# Patient Record
Sex: Female | Born: 1975 | Race: White | Hispanic: No | Marital: Married | Smoking: Former smoker
Health system: Southern US, Community
[De-identification: ages and names within clinical notes are randomized; demographics above are authoritative.]

## PROBLEM LIST (undated history)

## (undated) DIAGNOSIS — K37 Unspecified appendicitis: Secondary | ICD-10-CM

## (undated) DIAGNOSIS — F32A Depression, unspecified: Secondary | ICD-10-CM

## (undated) DIAGNOSIS — R011 Cardiac murmur, unspecified: Secondary | ICD-10-CM

## (undated) DIAGNOSIS — F329 Major depressive disorder, single episode, unspecified: Secondary | ICD-10-CM

## (undated) DIAGNOSIS — E7800 Pure hypercholesterolemia, unspecified: Secondary | ICD-10-CM

## (undated) DIAGNOSIS — E119 Type 2 diabetes mellitus without complications: Secondary | ICD-10-CM

## (undated) DIAGNOSIS — F419 Anxiety disorder, unspecified: Secondary | ICD-10-CM

## (undated) DIAGNOSIS — I10 Essential (primary) hypertension: Secondary | ICD-10-CM

## (undated) HISTORY — PX: APPENDECTOMY: SHX54

## (undated) HISTORY — PX: TONSILLECTOMY: SUR1361

## (undated) HISTORY — DX: Unspecified appendicitis: K37

---

## 2003-12-25 ENCOUNTER — Emergency Department: Payer: Self-pay | Admitting: Emergency Medicine

## 2004-03-26 ENCOUNTER — Emergency Department: Payer: Self-pay | Admitting: Unknown Physician Specialty

## 2004-03-29 ENCOUNTER — Emergency Department: Payer: Self-pay | Admitting: Emergency Medicine

## 2004-06-07 ENCOUNTER — Ambulatory Visit: Payer: Self-pay

## 2004-09-06 ENCOUNTER — Ambulatory Visit: Payer: Self-pay | Admitting: Unknown Physician Specialty

## 2004-09-12 ENCOUNTER — Emergency Department: Payer: Self-pay | Admitting: Emergency Medicine

## 2005-01-27 ENCOUNTER — Emergency Department: Payer: Self-pay | Admitting: Emergency Medicine

## 2005-04-03 ENCOUNTER — Emergency Department: Payer: Self-pay | Admitting: Emergency Medicine

## 2005-06-02 ENCOUNTER — Ambulatory Visit: Payer: Self-pay | Admitting: Family Medicine

## 2005-12-27 ENCOUNTER — Ambulatory Visit: Payer: Self-pay | Admitting: Gastroenterology

## 2006-06-11 ENCOUNTER — Emergency Department: Payer: Self-pay | Admitting: Emergency Medicine

## 2006-09-04 DIAGNOSIS — D509 Iron deficiency anemia, unspecified: Secondary | ICD-10-CM | POA: Insufficient documentation

## 2006-09-04 DIAGNOSIS — E1169 Type 2 diabetes mellitus with other specified complication: Secondary | ICD-10-CM | POA: Insufficient documentation

## 2006-09-04 DIAGNOSIS — E1159 Type 2 diabetes mellitus with other circulatory complications: Secondary | ICD-10-CM | POA: Insufficient documentation

## 2006-09-04 DIAGNOSIS — I152 Hypertension secondary to endocrine disorders: Secondary | ICD-10-CM | POA: Insufficient documentation

## 2006-10-23 DIAGNOSIS — J309 Allergic rhinitis, unspecified: Secondary | ICD-10-CM | POA: Insufficient documentation

## 2007-02-06 ENCOUNTER — Emergency Department: Payer: Self-pay | Admitting: Emergency Medicine

## 2007-05-01 DIAGNOSIS — R011 Cardiac murmur, unspecified: Secondary | ICD-10-CM | POA: Insufficient documentation

## 2007-07-21 ENCOUNTER — Emergency Department: Payer: Self-pay | Admitting: Emergency Medicine

## 2007-08-16 DIAGNOSIS — Z0000 Encounter for general adult medical examination without abnormal findings: Secondary | ICD-10-CM | POA: Insufficient documentation

## 2007-08-16 DIAGNOSIS — R319 Hematuria, unspecified: Secondary | ICD-10-CM | POA: Insufficient documentation

## 2007-09-04 DIAGNOSIS — J302 Other seasonal allergic rhinitis: Secondary | ICD-10-CM

## 2007-11-29 ENCOUNTER — Other Ambulatory Visit: Payer: Self-pay

## 2007-11-29 ENCOUNTER — Emergency Department: Payer: Self-pay | Admitting: Unknown Physician Specialty

## 2008-05-16 ENCOUNTER — Emergency Department: Payer: Self-pay | Admitting: Emergency Medicine

## 2009-09-25 ENCOUNTER — Emergency Department: Payer: Self-pay | Admitting: Emergency Medicine

## 2010-02-25 ENCOUNTER — Emergency Department: Payer: Self-pay | Admitting: Emergency Medicine

## 2010-11-06 ENCOUNTER — Emergency Department: Payer: Self-pay | Admitting: Emergency Medicine

## 2011-07-30 ENCOUNTER — Emergency Department: Payer: Self-pay | Admitting: Emergency Medicine

## 2011-08-23 ENCOUNTER — Emergency Department: Payer: Self-pay | Admitting: *Deleted

## 2011-08-23 LAB — URINALYSIS, COMPLETE
Leukocyte Esterase: NEGATIVE
Nitrite: NEGATIVE
Ph: 5 (ref 4.5–8.0)
Protein: 30
Specific Gravity: 1.029 (ref 1.003–1.030)
Squamous Epithelial: 1
WBC UR: 5 /HPF (ref 0–5)

## 2011-08-23 LAB — PREGNANCY, URINE: Pregnancy Test, Urine: NEGATIVE m[IU]/mL

## 2011-08-24 LAB — CBC WITH DIFFERENTIAL/PLATELET
HCT: 38.3 % (ref 35.0–47.0)
Lymphocyte #: 3.1 10*3/uL (ref 1.0–3.6)
Lymphocyte %: 29.2 %
MCH: 25.6 pg — ABNORMAL LOW (ref 26.0–34.0)
MCHC: 31.4 g/dL — ABNORMAL LOW (ref 32.0–36.0)
MCV: 82 fL (ref 80–100)
Monocyte #: 0.5 x10 3/mm (ref 0.2–0.9)
Monocyte %: 4.8 %
Neutrophil #: 6.9 10*3/uL — ABNORMAL HIGH (ref 1.4–6.5)
Neutrophil %: 64.9 %
Platelet: 341 10*3/uL (ref 150–440)
RDW: 14.6 % — ABNORMAL HIGH (ref 11.5–14.5)

## 2011-08-24 LAB — BASIC METABOLIC PANEL
Anion Gap: 10 (ref 7–16)
BUN: 7 mg/dL (ref 7–18)
Calcium, Total: 8.9 mg/dL (ref 8.5–10.1)
Co2: 25 mmol/L (ref 21–32)
Glucose: 254 mg/dL — ABNORMAL HIGH (ref 65–99)
Osmolality: 286 (ref 275–301)
Potassium: 4 mmol/L (ref 3.5–5.1)
Sodium: 140 mmol/L (ref 136–145)

## 2011-10-01 ENCOUNTER — Emergency Department: Payer: Self-pay | Admitting: Emergency Medicine

## 2011-11-24 ENCOUNTER — Ambulatory Visit: Payer: Self-pay | Admitting: Internal Medicine

## 2013-07-30 ENCOUNTER — Emergency Department: Payer: Self-pay | Admitting: Emergency Medicine

## 2014-05-11 ENCOUNTER — Emergency Department: Payer: Self-pay | Admitting: Emergency Medicine

## 2014-08-19 ENCOUNTER — Telehealth: Payer: Self-pay | Admitting: Family Medicine

## 2014-08-19 DIAGNOSIS — E1165 Type 2 diabetes mellitus with hyperglycemia: Secondary | ICD-10-CM

## 2014-08-19 DIAGNOSIS — E785 Hyperlipidemia, unspecified: Secondary | ICD-10-CM

## 2014-08-19 DIAGNOSIS — IMO0002 Reserved for concepts with insufficient information to code with codable children: Secondary | ICD-10-CM

## 2014-08-19 DIAGNOSIS — J301 Allergic rhinitis due to pollen: Secondary | ICD-10-CM

## 2014-08-19 DIAGNOSIS — I10 Essential (primary) hypertension: Secondary | ICD-10-CM

## 2014-08-19 DIAGNOSIS — K219 Gastro-esophageal reflux disease without esophagitis: Secondary | ICD-10-CM

## 2014-08-19 DIAGNOSIS — N920 Excessive and frequent menstruation with regular cycle: Secondary | ICD-10-CM

## 2014-08-19 DIAGNOSIS — D509 Iron deficiency anemia, unspecified: Secondary | ICD-10-CM

## 2014-08-19 DIAGNOSIS — G4700 Insomnia, unspecified: Secondary | ICD-10-CM

## 2014-08-19 NOTE — Telephone Encounter (Signed)
I have asked Michelle Warner to work on transferring patient chart from [employer] to [employer] so I can refill her several medications. This may take some time.

## 2014-08-19 NOTE — Telephone Encounter (Signed)
Patient's chart needs to be extracted from [employer] before I can send refills.

## 2014-08-19 NOTE — Telephone Encounter (Signed)
Patient requesting refills on all medications expecially Benzonatate. Please send to [facility]

## 2014-08-19 NOTE — Telephone Encounter (Signed)
Patient's chart has been abstracted, but is not visible as of 08/19/14 @ 5:00pm

## 2014-08-19 NOTE — Telephone Encounter (Signed)
Patient is requesting a refill on all medications. She is completely out of Benzonatate. Please send to [facility]

## 2014-08-20 DIAGNOSIS — E8881 Metabolic syndrome: Secondary | ICD-10-CM | POA: Insufficient documentation

## 2014-08-20 DIAGNOSIS — N920 Excessive and frequent menstruation with regular cycle: Secondary | ICD-10-CM | POA: Insufficient documentation

## 2014-08-20 DIAGNOSIS — G4733 Obstructive sleep apnea (adult) (pediatric): Secondary | ICD-10-CM | POA: Insufficient documentation

## 2014-08-20 DIAGNOSIS — E1165 Type 2 diabetes mellitus with hyperglycemia: Secondary | ICD-10-CM | POA: Insufficient documentation

## 2014-08-20 DIAGNOSIS — IMO0002 Reserved for concepts with insufficient information to code with codable children: Secondary | ICD-10-CM | POA: Insufficient documentation

## 2014-08-20 DIAGNOSIS — G4700 Insomnia, unspecified: Secondary | ICD-10-CM | POA: Insufficient documentation

## 2014-08-20 MED ORDER — OMEPRAZOLE 40 MG PO CPDR: 40.0000 mg | DELAYED_RELEASE_CAPSULE | Freq: Every day | ORAL | Status: DC

## 2014-08-20 MED ORDER — METFORMIN HCL 500 MG PO TABS: 500.0000 mg | ORAL_TABLET | Freq: Two times a day (BID) | ORAL | Status: DC

## 2014-08-20 MED ORDER — FLUTICASONE PROPIONATE 50 MCG/ACT NA SUSP: 2.0000 | Freq: Every day | NASAL | Status: DC

## 2014-08-20 MED ORDER — LOVASTATIN 20 MG PO TABS: 20.0000 mg | ORAL_TABLET | Freq: Every day | ORAL | Status: DC

## 2014-08-20 MED ORDER — CANAGLIFLOZIN 100 MG PO TABS: 100.0000 mg | ORAL_TABLET | Freq: Every day | ORAL | Status: DC

## 2014-08-20 MED ORDER — GLIPIZIDE 10 MG PO TABS: 10.0000 mg | ORAL_TABLET | Freq: Two times a day (BID) | ORAL | Status: DC

## 2014-08-20 MED ORDER — TRAZODONE HCL 50 MG PO TABS: 50.0000 mg | ORAL_TABLET | Freq: Every evening | ORAL | Status: DC | PRN

## 2014-08-20 MED ORDER — LISINOPRIL-HYDROCHLOROTHIAZIDE 20-12.5 MG PO TABS: 1.0000 | ORAL_TABLET | Freq: Every day | ORAL | Status: DC

## 2014-08-20 MED ORDER — BENZONATATE 100 MG PO CAPS: 100.0000 mg | ORAL_CAPSULE | Freq: Three times a day (TID) | ORAL | Status: DC | PRN

## 2014-08-20 MED ORDER — FERROUS SULFATE 325 (65 FE) MG PO TABS: 325.0000 mg | ORAL_TABLET | Freq: Two times a day (BID) | ORAL | Status: DC

## 2014-08-20 MED ORDER — NORGESTIMATE-ETH ESTRADIOL 0.25-35 MG-MCG PO TABS: 1.0000 | ORAL_TABLET | Freq: Every day | ORAL | Status: DC

## 2014-08-20 NOTE — Telephone Encounter (Signed)
All medications abstracted, assigned to diagnosis and refilled to preferred pharmacy.

## 2014-09-10 ENCOUNTER — Other Ambulatory Visit: Payer: Self-pay | Admitting: Family Medicine

## 2014-09-10 NOTE — Telephone Encounter (Signed)
PT NEEDS REFILL ON BIRTH CONTROL PILLS. PHARM IS [place].

## 2014-09-11 ENCOUNTER — Other Ambulatory Visit: Payer: Self-pay

## 2014-09-11 DIAGNOSIS — N920 Excessive and frequent menstruation with regular cycle: Secondary | ICD-10-CM

## 2014-09-11 MED ORDER — NORGESTIMATE-ETH ESTRADIOL 0.25-35 MG-MCG PO TABS: 1.0000 | ORAL_TABLET | Freq: Every day | ORAL | Status: DC

## 2014-09-11 NOTE — Telephone Encounter (Signed)
Refill request was placed and sent to Dr. Nadine Counts for her approval.

## 2014-09-11 NOTE — Telephone Encounter (Signed)
Patient called requesting a refill of her birth control pills. Patient uses [facility] on [address]

## 2014-09-16 ENCOUNTER — Telehealth: Payer: Self-pay

## 2014-09-16 NOTE — Telephone Encounter (Signed)
Pt called would like refill on tesslon for her cough if possible.[phone]

## 2014-09-18 ENCOUNTER — Other Ambulatory Visit: Payer: Self-pay | Admitting: Family Medicine

## 2014-09-18 DIAGNOSIS — R0982 Postnasal drip: Principal | ICD-10-CM

## 2014-09-18 DIAGNOSIS — J309 Allergic rhinitis, unspecified: Secondary | ICD-10-CM

## 2014-09-19 ENCOUNTER — Emergency Department
Admission: EM | Admit: 2014-09-19 | Discharge: 2014-09-19 | Disposition: A | Discharge status: Home / Self Care | Payer: Medicaid Other | Attending: Emergency Medicine | Admitting: Emergency Medicine

## 2014-09-19 ENCOUNTER — Encounter: Payer: Self-pay | Admitting: Emergency Medicine

## 2014-09-19 ENCOUNTER — Emergency Department: Payer: Medicaid Other

## 2014-09-19 DIAGNOSIS — J310 Chronic rhinitis: Secondary | ICD-10-CM | POA: Insufficient documentation

## 2014-09-19 DIAGNOSIS — I10 Essential (primary) hypertension: Secondary | ICD-10-CM | POA: Insufficient documentation

## 2014-09-19 DIAGNOSIS — E119 Type 2 diabetes mellitus without complications: Secondary | ICD-10-CM | POA: Insufficient documentation

## 2014-09-19 DIAGNOSIS — Z79899 Other long term (current) drug therapy: Secondary | ICD-10-CM | POA: Insufficient documentation

## 2014-09-19 DIAGNOSIS — Z87891 Personal history of nicotine dependence: Secondary | ICD-10-CM | POA: Insufficient documentation

## 2014-09-19 DIAGNOSIS — G4733 Obstructive sleep apnea (adult) (pediatric): Secondary | ICD-10-CM | POA: Insufficient documentation

## 2014-09-19 HISTORY — DX: Essential (primary) hypertension: I10

## 2014-09-19 HISTORY — DX: Pure hypercholesterolemia, unspecified: E78.00

## 2014-09-19 HISTORY — DX: Cardiac murmur, unspecified: R01.1

## 2014-09-19 HISTORY — DX: Type 2 diabetes mellitus without complications: E11.9

## 2014-09-19 MED ORDER — ALBUTEROL SULFATE HFA 108 (90 BASE) MCG/ACT IN AERS: 2.0000 | INHALATION_SPRAY | Freq: Four times a day (QID) | RESPIRATORY_TRACT | Status: DC | PRN

## 2014-09-19 MED ORDER — IPRATROPIUM-ALBUTEROL 0.5-2.5 (3) MG/3ML IN SOLN
3.0000 mL | Freq: Once | RESPIRATORY_TRACT | Status: AC
  Administered 2014-09-19: 3 mL via RESPIRATORY_TRACT

## 2014-09-19 MED ORDER — IPRATROPIUM-ALBUTEROL 0.5-2.5 (3) MG/3ML IN SOLN
RESPIRATORY_TRACT | Status: AC
  Administered 2014-09-19: 3 mL via RESPIRATORY_TRACT
  Filled 2014-09-19: qty 3

## 2014-09-19 NOTE — Discharge Instructions (Signed)
Sleep Apnea Sleep apnea is disorder that affects a person's sleep. A person with sleep apnea has abnormal pauses in their breathing when they sleep. It is hard for them to get a good sleep. This makes a person tired during the day. It also can lead to other physical problems. There are three types of sleep apnea. One type is when breathing stops for a short time because your airway is blocked (obstructive sleep apnea). Another type is when the brain sometimes fails to give the normal signal to breathe to the muscles that control your breathing (central sleep apnea). The third type is a combination of the other two types. HOME CARE  Do not sleep on your back. Try to sleep on your side.  Take all medicine as told by your doctor.  Avoid alcohol, calming medicines (sedatives), and depressant drugs.  Try to lose weight if you are overweight. Talk to your doctor about a healthy weight goal. Your doctor may have you use a device that helps to open your airway. It can help you get the air that you need. It is called a positive airway pressure (PAP) device. There are three types of PAP devices:  Continuous positive airway pressure (CPAP) device.  Nasal expiratory positive airway pressure (EPAP) device.  Bilevel positive airway pressure (BPAP) device. MAKE SURE YOU:  Understand these instructions.  Will watch your condition.  Will get help right away if you are not doing well or get worse. Document Released: 12/14/2007 Document Revised: 02/21/2012 Document Reviewed: 07/08/2011 [facility] Patient Information 2015 [place], [place]. This information is not intended to replace advice given to you by your health care provider. Make sure you discuss any questions you have with your health care provider.  Your exam and chest x-ray are normal.  Use the albuterol inhaler as needed.  Follow-up with [facility] for continued symptoms.

## 2014-09-19 NOTE — ED Notes (Signed)
Clear drainage

## 2014-09-19 NOTE — ED Provider Notes (Signed)
[facility] Emergency Department Provider Note ____________________________________________  Time seen: 2040  I have reviewed the triage vital signs and the nursing notes.  HISTORY  Chief Complaint  Nasal Congestion  HPI Michelle Warner is a 39 y.o. female reports to the ED with complaints of sinus congestion as well as some increased shortness of breath. She reports 2 separate episodes of the last couple days, when she is awoken at 5:30 in the morning, the sensation of severe shortness of breath. She denies fever, chills, sweats, or cough. She notes clear nasal drainage, and sensation of fullness in the chest. She has been taking her previously prescribed allergy medicine and her nasal spray as well.  Past Medical History  Diagnosis Date  . Diabetes mellitus without complication   . Hypertension   . Heart murmur   . Hypercholesterolemia     Patient Active Problem List   Diagnosis Date Noted  . Body mass index of 60 or higher 08/20/2014  . Cannot sleep 08/20/2014  . Excessive and frequent menstruation 08/20/2014  . Dysmetabolic syndrome 02/77/4128  . Obstructive sleep apnea of adult 08/20/2014  . Type II diabetes mellitus, uncontrolled 08/20/2014  . Migraine without aura and responsive to treatment 05/01/2007  . Allergic rhinitis 10/23/2006  . Gastro-esophageal reflux disease without esophagitis 09/04/2006  . Hypertension goal BP (blood pressure) < 140/90 09/04/2006  . HLD (hyperlipidemia) 09/04/2006  . Anemia, iron deficiency 09/04/2006    Past Surgical History  Procedure Laterality Date  . Tonsillectomy      Current Outpatient Rx  Name  Route  Sig  Dispense  Refill  . loratadine (CLARITIN) 10 MG tablet   Oral   Take 10 mg by mouth daily.         Marland Kitchen albuterol (PROVENTIL HFA;VENTOLIN HFA) 108 (90 BASE) MCG/ACT inhaler   Inhalation   Inhale 2 puffs into the lungs every 6 (six) hours as needed for wheezing or shortness of breath.   1 Inhaler    0   . benzonatate (TESSALON) 100 MG capsule   Oral   Take 1-2 capsules (100-200 mg total) by mouth every 8 (eight) hours as needed for cough.   20 capsule   0   . canagliflozin (INVOKANA) 100 MG TABS tablet   Oral   Take 1 tablet (100 mg total) by mouth daily.   30 tablet   5   . EQ ALLERGY RELIEF 10 MG tablet      TAKE ONE TABLET BY MOUTH ONCE DAILY   30 tablet   5   . ferrous sulfate 325 (65 FE) MG tablet   Oral   Take 1 tablet (325 mg total) by mouth 2 (two) times daily.   60 tablet   5   . fluticasone (FLONASE) 50 MCG/ACT nasal spray   Each Nare   Place 2 sprays into both nostrils daily.   16 g   2   . glipiZIDE (GLUCOTROL) 10 MG tablet   Oral   Take 1 tablet (10 mg total) by mouth 2 (two) times daily.   60 tablet   5   . lisinopril-hydrochlorothiazide (PRINZIDE,ZESTORETIC) 20-12.5 MG per tablet   Oral   Take 1 tablet by mouth daily.   30 tablet   5   . lovastatin (MEVACOR) 20 MG tablet   Oral   Take 1 tablet (20 mg total) by mouth at bedtime.   30 tablet   5   . metFORMIN (GLUCOPHAGE) 500 MG tablet  Oral   Take 1 tablet (500 mg total) by mouth 2 (two) times daily.   60 tablet   5   . norgestimate-ethinyl estradiol (ORTHO-CYCLEN,SPRINTEC,PREVIFEM) 0.25-35 MG-MCG tablet   Oral   Take 1 tablet by mouth daily.   1 Package   5   . omeprazole (PRILOSEC) 40 MG capsule   Oral   Take 1 capsule (40 mg total) by mouth daily.   30 capsule   5   . traZODone (DESYREL) 50 MG tablet   Oral   Take 1 tablet (50 mg total) by mouth at bedtime as needed.   30 tablet   5     Allergies Review of patient's allergies indicates not on file.  No family history on file.  Social History History  Substance Use Topics  . Smoking status: Former Research scientist (life sciences)  . Smokeless tobacco: Not on file  . Alcohol Use: No   Review of Systems  Constitutional: Negative for fever. Eyes: Negative for visual changes. ENT: Negative for sore throat. Cardiovascular: Negative  for chest pain. Respiratory: Positive for shortness of breath. Gastrointestinal: Negative for abdominal pain, vomiting and diarrhea. Genitourinary: Negative for dysuria. Musculoskeletal: Negative for back pain. Skin: Negative for rash. Neurological: Negative for headaches, focal weakness or numbness. ____________________________________________  PHYSICAL EXAM:  VITAL SIGNS: ED Triage Vitals  Enc Vitals Group     BP 09/19/14 1905 131/82 mmHg     Pulse Rate 09/19/14 1905 100     Resp 09/19/14 1905 18     Temp 09/19/14 1905 98.2 F (36.8 C)     Temp Source 09/19/14 1905 Oral     SpO2 09/19/14 1905 97 %     Weight 09/19/14 1905 223 lb (101.152 kg)     Height 09/19/14 1905 5\' 2"  (1.575 m)     Head Cir --      Peak Flow --      Pain Score 09/19/14 1906 9     Pain Loc --      Pain Edu? --      Excl. in [place]? --    Constitutional: Alert and oriented. Well appearing and in no distress. Eyes: Conjunctivae are normal. PERRL. Normal extraocular movements. ENT   Head: Normocephalic and atraumatic.   Nose: No congestion/rhinnorhea.   Mouth/Throat: Mucous membranes are moist.   Neck: Supple. No thyromegaly. Hematological/Lymphatic/Immunilogical: No cervical lymphadenopathy. Cardiovascular: Normal rate, regular rhythm.  Respiratory: Normal respiratory effort. No wheezes/rales/rhonchi. Gastrointestinal: Soft and nontender. No distention. Musculoskeletal: Nontender with normal range of motion in all extremities.  Neurologic:  Normal gait without ataxia. Normal speech and language. No gross focal neurologic deficits are appreciated. Skin:  Skin is warm, dry and intact. No rash noted. Psychiatric: Mood and affect are normal. Patient exhibits appropriate insight and judgment. ____________________________________________   RADIOLOGY CXR - viewed by me; interpreted by radiology IMPRESSION: No active cardiopulmonary  disease ____________________________________________  PROCEDURES  DuoNeb x 1 ____________________________________________  INITIAL IMPRESSION / ASSESSMENT AND PLAN / ED COURSE  Allergic rhinitis and chronic OSA. Radiology results to patient, reassurance about dyspnea. Patient reports improvement in SOB following breathing treatment. Symptoms may be related to panic attacks.  Suggest follow-up with primary provider for ongoing symptoms. Continue home meds as directed. ____________________________________________  FINAL CLINICAL IMPRESSION(S) / ED DIAGNOSES  Final diagnoses:  Rhinitis, chronic  OSA (obstructive sleep apnea)     Melvenia Needles, PA-C 09/20/14 0050  Ahmed Prima, MD 09/20/14 2332

## 2014-10-14 ENCOUNTER — Ambulatory Visit: Payer: Self-pay | Admitting: Family Medicine

## 2014-12-08 ENCOUNTER — Emergency Department: Payer: Self-pay

## 2014-12-08 ENCOUNTER — Observation Stay: Payer: Self-pay | Admitting: Anesthesiology

## 2014-12-08 ENCOUNTER — Encounter: Payer: Self-pay | Admitting: Emergency Medicine

## 2014-12-08 ENCOUNTER — Observation Stay
Admission: EM | Admit: 2014-12-08 | Discharge: 2014-12-09 | Disposition: A | Discharge status: Home / Self Care | Payer: Self-pay | Attending: Surgery | Admitting: Surgery

## 2014-12-08 ENCOUNTER — Encounter
Admission: EM | Disposition: A | Discharge status: Home / Self Care | Payer: Self-pay | Source: Home / Self Care | Attending: Emergency Medicine

## 2014-12-08 ENCOUNTER — Observation Stay: Payer: Medicaid Other | Admitting: Anesthesiology

## 2014-12-08 DIAGNOSIS — Z87891 Personal history of nicotine dependence: Secondary | ICD-10-CM | POA: Insufficient documentation

## 2014-12-08 DIAGNOSIS — R6883 Chills (without fever): Secondary | ICD-10-CM | POA: Insufficient documentation

## 2014-12-08 DIAGNOSIS — E780 Pure hypercholesterolemia: Secondary | ICD-10-CM | POA: Insufficient documentation

## 2014-12-08 DIAGNOSIS — E119 Type 2 diabetes mellitus without complications: Secondary | ICD-10-CM | POA: Insufficient documentation

## 2014-12-08 DIAGNOSIS — E669 Obesity, unspecified: Secondary | ICD-10-CM | POA: Insufficient documentation

## 2014-12-08 DIAGNOSIS — K37 Unspecified appendicitis: Principal | ICD-10-CM | POA: Insufficient documentation

## 2014-12-08 DIAGNOSIS — E1165 Type 2 diabetes mellitus with hyperglycemia: Secondary | ICD-10-CM

## 2014-12-08 DIAGNOSIS — K353 Acute appendicitis with localized peritonitis, without perforation or gangrene: Secondary | ICD-10-CM

## 2014-12-08 DIAGNOSIS — Z6841 Body Mass Index (BMI) 40.0 and over, adult: Secondary | ICD-10-CM | POA: Insufficient documentation

## 2014-12-08 DIAGNOSIS — N852 Hypertrophy of uterus: Secondary | ICD-10-CM | POA: Insufficient documentation

## 2014-12-08 DIAGNOSIS — R079 Chest pain, unspecified: Secondary | ICD-10-CM | POA: Insufficient documentation

## 2014-12-08 DIAGNOSIS — I10 Essential (primary) hypertension: Secondary | ICD-10-CM | POA: Insufficient documentation

## 2014-12-08 DIAGNOSIS — Z794 Long term (current) use of insulin: Secondary | ICD-10-CM | POA: Insufficient documentation

## 2014-12-08 DIAGNOSIS — D509 Iron deficiency anemia, unspecified: Secondary | ICD-10-CM | POA: Insufficient documentation

## 2014-12-08 DIAGNOSIS — R319 Hematuria, unspecified: Secondary | ICD-10-CM | POA: Insufficient documentation

## 2014-12-08 DIAGNOSIS — R1031 Right lower quadrant pain: Secondary | ICD-10-CM | POA: Insufficient documentation

## 2014-12-08 DIAGNOSIS — IMO0002 Reserved for concepts with insufficient information to code with codable children: Secondary | ICD-10-CM

## 2014-12-08 HISTORY — PX: LAPAROSCOPIC APPENDECTOMY: SHX408

## 2014-12-08 LAB — URINALYSIS COMPLETE WITH MICROSCOPIC (ARMC ONLY)
Bilirubin Urine: NEGATIVE
GLUCOSE, UA: 50 mg/dL — AB
Nitrite: NEGATIVE
Protein, ur: 30 mg/dL — AB
Specific Gravity, Urine: 1.024 (ref 1.005–1.030)
pH: 5 (ref 5.0–8.0)

## 2014-12-08 LAB — GLUCOSE, CAPILLARY: Glucose-Capillary: 227 mg/dL — ABNORMAL HIGH (ref 65–99)

## 2014-12-08 LAB — CBC
HCT: 38.4 % (ref 35.0–47.0)
Hemoglobin: 12.7 g/dL (ref 12.0–16.0)
MCH: 28.4 pg (ref 26.0–34.0)
MCHC: 33.2 g/dL (ref 32.0–36.0)
MCV: 85.6 fL (ref 80.0–100.0)
Platelets: 365 10*3/uL (ref 150–440)
RBC: 4.48 MIL/uL (ref 3.80–5.20)
RDW: 12.9 % (ref 11.5–14.5)
WBC: 18.2 10*3/uL — ABNORMAL HIGH (ref 3.6–11.0)

## 2014-12-08 LAB — BASIC METABOLIC PANEL
Anion gap: 11 (ref 5–15)
BUN: 10 mg/dL (ref 6–20)
CALCIUM: 8.9 mg/dL (ref 8.9–10.3)
CHLORIDE: 101 mmol/L (ref 101–111)
CO2: 23 mmol/L (ref 22–32)
CREATININE: 0.65 mg/dL (ref 0.44–1.00)
GFR calc Af Amer: >60 mL/min (ref >60)
GFR calc non Af Amer: >60 mL/min (ref >60)
Glucose, Bld: 247 mg/dL — ABNORMAL HIGH (ref 65–99)
Potassium: 3.8 mmol/L (ref 3.5–5.1)
SODIUM: 135 mmol/L (ref 135–145)

## 2014-12-08 LAB — TROPONIN I: Troponin I: 0.03 ng/mL (ref <0.031)

## 2014-12-08 SURGERY — APPENDECTOMY, LAPAROSCOPIC
Anesthesia: General | Wound class: Clean Contaminated

## 2014-12-08 MED ORDER — FENTANYL CITRATE (PF) 100 MCG/2ML IJ SOLN: 25.0000 ug | INTRAMUSCULAR | Status: DC | PRN

## 2014-12-08 MED ORDER — HYDROCODONE-ACETAMINOPHEN 5-325 MG PO TABS: 1.0000 | ORAL_TABLET | Freq: Four times a day (QID) | ORAL | Status: DC | PRN

## 2014-12-08 MED ORDER — HYDRALAZINE HCL 20 MG/ML IJ SOLN: 10.0000 mg | INTRAMUSCULAR | Status: DC | PRN

## 2014-12-08 MED ORDER — ONDANSETRON HCL 4 MG/2ML IJ SOLN: 4.0000 mg | Freq: Four times a day (QID) | INTRAMUSCULAR | Status: DC | PRN

## 2014-12-08 MED ORDER — ROCURONIUM BROMIDE 100 MG/10ML IV SOLN
INTRAVENOUS | Status: DC | PRN
  Administered 2014-12-08: 15 mg via INTRAVENOUS

## 2014-12-08 MED ORDER — NEOSTIGMINE METHYLSULFATE 10 MG/10ML IV SOLN
INTRAVENOUS | Status: DC | PRN
  Administered 2014-12-08: 3 mg via INTRAVENOUS

## 2014-12-08 MED ORDER — DIPHENHYDRAMINE HCL 50 MG/ML IJ SOLN: 25.0000 mg | Freq: Four times a day (QID) | INTRAMUSCULAR | Status: DC | PRN

## 2014-12-08 MED ORDER — BUPIVACAINE HCL 0.25 % IJ SOLN
INTRAMUSCULAR | Status: DC | PRN
  Administered 2014-12-08: 30 mL

## 2014-12-08 MED ORDER — ONDANSETRON HCL 4 MG PO TABS: 4.0000 mg | ORAL_TABLET | Freq: Four times a day (QID) | ORAL | Status: DC | PRN

## 2014-12-08 MED ORDER — FAMOTIDINE 20 MG PO TABS
ORAL_TABLET | ORAL | Status: AC
  Administered 2014-12-08: 40 mg via ORAL
  Filled 2014-12-08: qty 2

## 2014-12-08 MED ORDER — ACETAMINOPHEN 650 MG RE SUPP: 650.0000 mg | Freq: Four times a day (QID) | RECTAL | Status: DC | PRN

## 2014-12-08 MED ORDER — MORPHINE SULFATE (PF) 2 MG/ML IV SOLN
2.0000 mg | INTRAVENOUS | Status: DC | PRN
  Administered 2014-12-08: 2 mg via INTRAVENOUS
  Filled 2014-12-08: qty 1

## 2014-12-08 MED ORDER — ERTAPENEM SODIUM 1 G IJ SOLR: 1.0000 g | INTRAMUSCULAR | Status: DC

## 2014-12-08 MED ORDER — ONDANSETRON 4 MG PO TBDP: 4.0000 mg | ORAL_TABLET | Freq: Four times a day (QID) | ORAL | Status: DC | PRN

## 2014-12-08 MED ORDER — SODIUM CHLORIDE 0.9 % IV BOLUS (SEPSIS)
1000.0000 mL | Freq: Once | INTRAVENOUS | Status: AC
  Administered 2014-12-08: 1000 mL via INTRAVENOUS

## 2014-12-08 MED ORDER — MIDAZOLAM HCL 2 MG/2ML IJ SOLN
INTRAMUSCULAR | Status: DC | PRN
  Administered 2014-12-08 (×2): 1 mg via INTRAVENOUS

## 2014-12-08 MED ORDER — GI COCKTAIL ~~LOC~~
30.0000 mL | ORAL | Status: AC
  Administered 2014-12-08: 30 mL via ORAL
  Filled 2014-12-08: qty 30

## 2014-12-08 MED ORDER — ONDANSETRON HCL 4 MG/2ML IJ SOLN
INTRAMUSCULAR | Status: DC | PRN
  Administered 2014-12-08: 4 mg via INTRAVENOUS

## 2014-12-08 MED ORDER — LIDOCAINE HCL (CARDIAC) 20 MG/ML IV SOLN
INTRAVENOUS | Status: DC | PRN
Start: 2014-12-08 — End: 2014-12-08
  Administered 2014-12-08: 50 mg via INTRAVENOUS

## 2014-12-08 MED ORDER — HYDROCODONE-ACETAMINOPHEN 5-325 MG PO TABS: 1.0000 | ORAL_TABLET | ORAL | Status: DC | PRN

## 2014-12-08 MED ORDER — DEXAMETHASONE SODIUM PHOSPHATE 4 MG/ML IJ SOLN
INTRAMUSCULAR | Status: DC | PRN
  Administered 2014-12-08: 4 mg via INTRAVENOUS

## 2014-12-08 MED ORDER — SODIUM CHLORIDE 0.9 % IV SOLN
1.0000 g | INTRAVENOUS | Status: DC
  Administered 2014-12-08: 1 g via INTRAVENOUS
  Filled 2014-12-08 (×2): qty 1

## 2014-12-08 MED ORDER — FAMOTIDINE 40 MG PO TABS
40.0000 mg | ORAL_TABLET | Freq: Once | ORAL | Status: AC
  Administered 2014-12-08: 40 mg via ORAL
  Filled 2014-12-08: qty 1

## 2014-12-08 MED ORDER — PROPOFOL 10 MG/ML IV BOLUS
INTRAVENOUS | Status: DC | PRN
  Administered 2014-12-08: 200 mg via INTRAVENOUS

## 2014-12-08 MED ORDER — PROMETHAZINE HCL 25 MG/ML IJ SOLN: 6.2500 mg | INTRAMUSCULAR | Status: DC | PRN

## 2014-12-08 MED ORDER — ACETAMINOPHEN 325 MG PO TABS: 650.0000 mg | ORAL_TABLET | Freq: Four times a day (QID) | ORAL | Status: DC | PRN

## 2014-12-08 MED ORDER — ALBUTEROL SULFATE (2.5 MG/3ML) 0.083% IN NEBU: 2.5000 mg | INHALATION_SOLUTION | Freq: Four times a day (QID) | RESPIRATORY_TRACT | Status: DC | PRN

## 2014-12-08 MED ORDER — GLYCOPYRROLATE 0.2 MG/ML IJ SOLN
INTRAMUSCULAR | Status: DC | PRN
  Administered 2014-12-08: 0.4 mg via INTRAVENOUS

## 2014-12-08 MED ORDER — FENTANYL CITRATE (PF) 100 MCG/2ML IJ SOLN
INTRAMUSCULAR | Status: DC | PRN
  Administered 2014-12-08 (×4): 50 ug via INTRAVENOUS

## 2014-12-08 MED ORDER — DIPHENHYDRAMINE HCL 25 MG PO CAPS: 25.0000 mg | ORAL_CAPSULE | Freq: Four times a day (QID) | ORAL | Status: DC | PRN

## 2014-12-08 MED ORDER — MORPHINE SULFATE (PF) 4 MG/ML IV SOLN: 4.0000 mg | INTRAVENOUS | Status: DC | PRN

## 2014-12-08 MED ORDER — HYDROCODONE-ACETAMINOPHEN 5-325 MG PO TABS
1.0000 | ORAL_TABLET | ORAL | Status: DC | PRN
  Administered 2014-12-09: 2 via ORAL
  Administered 2014-12-09: 1 via ORAL
  Filled 2014-12-08: qty 1
  Filled 2014-12-08: qty 2

## 2014-12-08 MED ORDER — KCL IN DEXTROSE-NACL 20-5-0.45 MEQ/L-%-% IV SOLN
INTRAVENOUS | Status: DC
  Administered 2014-12-09 (×2): via INTRAVENOUS
  Filled 2014-12-08 (×6): qty 1000

## 2014-12-08 MED ORDER — INSULIN ASPART 100 UNIT/ML ~~LOC~~ SOLN
0.0000 [IU] | Freq: Three times a day (TID) | SUBCUTANEOUS | Status: DC
  Administered 2014-12-09: 5 [IU] via SUBCUTANEOUS
  Administered 2014-12-09: 8 [IU] via SUBCUTANEOUS
  Filled 2014-12-08: qty 5
  Filled 2014-12-08: qty 8

## 2014-12-08 MED ORDER — SUCCINYLCHOLINE CHLORIDE 20 MG/ML IJ SOLN
INTRAMUSCULAR | Status: DC | PRN
  Administered 2014-12-08: 100 mg via INTRAVENOUS

## 2014-12-08 MED ORDER — LACTATED RINGERS IV SOLN
INTRAVENOUS | Status: DC | PRN
  Administered 2014-12-08: 20:00:00 via INTRAVENOUS

## 2014-12-08 SURGICAL SUPPLY — 39 items
CANISTER SUCT 1200ML W/VALVE (MISCELLANEOUS) ×2 IMPLANT
CHLORAPREP W/TINT 26ML (MISCELLANEOUS) ×4 IMPLANT
CUTTER LINEAR ENDO 35 ART FLEX (STAPLE) ×2 IMPLANT
CUTTER LINEAR ENDO 35 ETS (STAPLE) ×2 IMPLANT
DEFOGGER SCOPE WARMER CLEARIFY (MISCELLANEOUS) ×2 IMPLANT
DRAPE SHEET LG 3/4 BI-LAMINATE (DRAPES) ×2 IMPLANT
DRAPE UTILITY 15X26 TOWEL STRL (DRAPES) ×4 IMPLANT
DRESSING TELFA 4X3 1S ST N-ADH (GAUZE/BANDAGES/DRESSINGS) ×2 IMPLANT
DRSG TEGADERM 2-3/8X2-3/4 SM (GAUZE/BANDAGES/DRESSINGS) ×6 IMPLANT
ENDOPOUCH RETRIEVER 10 (MISCELLANEOUS) ×2 IMPLANT
GAUZE SPONGE 4X4 12PLY STRL (GAUZE/BANDAGES/DRESSINGS) ×2 IMPLANT
GLOVE BIO SURGEON STRL SZ7.5 (GLOVE) ×2 IMPLANT
GOWN STRL REUS W/ TWL LRG LVL3 (GOWN DISPOSABLE) ×1 IMPLANT
GOWN STRL REUS W/ TWL XL LVL3 (GOWN DISPOSABLE) ×1 IMPLANT
GOWN STRL REUS W/TWL LRG LVL3 (GOWN DISPOSABLE) ×1
GOWN STRL REUS W/TWL XL LVL3 (GOWN DISPOSABLE) ×1
IRRIGATION STRYKERFLOW (MISCELLANEOUS) ×1 IMPLANT
IRRIGATOR STRYKERFLOW (MISCELLANEOUS) ×2
IV NS 1000ML (IV SOLUTION) ×1
IV NS 1000ML BAXH (IV SOLUTION) ×1 IMPLANT
LABEL OR SOLS (LABEL) ×2 IMPLANT
NEEDLE HYPO 25X1 1.5 SAFETY (NEEDLE) ×2 IMPLANT
NS IRRIG 500ML POUR BTL (IV SOLUTION) ×2 IMPLANT
PACK LAP CHOLECYSTECTOMY (MISCELLANEOUS) ×2 IMPLANT
PAD GROUND ADULT SPLIT (MISCELLANEOUS) ×2 IMPLANT
RELOAD CUTTER ETS 35MM STAND (ENDOMECHANICALS) ×2 IMPLANT
SCISSORS METZENBAUM CVD 33 (INSTRUMENTS) ×2 IMPLANT
SHEARS HARMONIC ACE PLUS 36CM (ENDOMECHANICALS) ×2 IMPLANT
SLEEVE ENDOPATH XCEL 5M (ENDOMECHANICALS) ×2 IMPLANT
STRAP SAFETY BODY (MISCELLANEOUS) ×2 IMPLANT
STRIP CLOSURE SKIN 1/2X4 (GAUZE/BANDAGES/DRESSINGS) ×2 IMPLANT
SUT VIC AB 0 UR5 27 (SUTURE) ×4 IMPLANT
SUT VIC AB 4-0 RB1 27 (SUTURE) ×1
SUT VIC AB 4-0 RB1 27X BRD (SUTURE) ×1 IMPLANT
SWABSTK COMLB BENZOIN TINCTURE (MISCELLANEOUS) ×2 IMPLANT
TROCAR XCEL 12X100 BLDLESS (ENDOMECHANICALS) ×2 IMPLANT
TROCAR XCEL BLUNT TIP 100MML (ENDOMECHANICALS) ×2 IMPLANT
TROCAR XCEL NON-BLD 5MMX100MML (ENDOMECHANICALS) ×2 IMPLANT
TUBING INSUFFLATOR HI FLOW (MISCELLANEOUS) ×2 IMPLANT

## 2014-12-08 NOTE — Progress Notes (Signed)
Assuming care from Dr Adonis Huguenin  Case reviewed along with CT scans  Consistent with early acute appendicitis.  Discussed in detail with patient laparoscopic appendectomy risks of surgery including possible open procedure.  All questions addressed.

## 2014-12-08 NOTE — Op Note (Signed)
12/08/2014  9:23 PM  PATIENT:  Michelle Warner  39 y.o. female  PRE-OPERATIVE DIAGNOSIS:  appendicitis  POST-OPERATIVE DIAGNOSIS:  appendicitis  PROCEDURE:  Procedure(s): APPENDECTOMY LAPAROSCOPIC (N/A)  SURGEON:  Surgeon(s) and Role:    * Sherri Rad, MD - Primary  ASSISTANTS: tech  ANESTHESIA: Gen.     SPECIMEN: Appendix    EBL: Minimal  Description of procedure:    With informed consent, supine position, sterile prep and drape a timeout was observed. A 12 mm blunt Hassan trocar was placed through an open technique through an infraumbilical transversely skin incision. Pneumoperitoneum was established. Patient was positioned in Trendelenburg and right side tilted up.  A 5 mm blade less trocar was placed in the right upper quadrant. There were omental adhesions to the left paramedian midline. These were taken down with sharp dissection point cautery and Harmonic scalpel. A 5 mm blade-less trocar was placed in left lower quadrant. The uterus was markedly enlarged, the right ovary was visualized found to be normal. Left ovary obscured by uterus.  Photodocumentation was obtained. The appendix was moderately injected. The base the appendix and the confluence of the tinea were identified. A window was fashioned at the base of the appendix with blunt technique and the mesoappendix was divided with small sequential bites utilizing the Harmonic scalpel apparatus using the advanced hemostasis feature. The base of the appendix was transected with a single fire of the articulated endoscopic GIA stapler with blue load application. The specimen was captured in an Endo Catch device and retrieved. The right lower quadrant was irrigated with roughly 500 cc of warm normal saline and aspirated dry. Hemostasis appeared to be adequate on the operative field. Trochars were removed under direct visualization. The infraumbilical fascial defect was reapproximated with several interrupted figure-of-eight #0 Vicryl  sutures in vertical orientation. A total of 30 cc of 0.25% plain Marcaine was infiltrated along all skin and fascial incisions prior to closure and skin edges were reapproximated with 4-0 Vicryls suture in subcuticular fashion. Sterile dressings were then applied including Steri-Strips Telfa and Tegaderm.

## 2014-12-08 NOTE — Discharge Instructions (Signed)
Appendicitis °Appendicitis is when the appendix is swollen (inflamed). The inflammation can lead to developing a hole (perforation) and a collection of pus (abscess). °CAUSES  °There is not always an obvious cause of appendicitis. Sometimes it is caused by an obstruction in the appendix. The obstruction can be caused by: °· A small, hard, pea-sized ball of stool (fecalith). °· Enlarged lymph glands in the appendix. °SYMPTOMS  °· Pain around your belly button (navel) that moves toward your lower right belly (abdomen). The pain can become more severe and sharp as time passes. °· Tenderness in the lower right abdomen. Pain gets worse if you cough or make a sudden movement. °· Feeling sick to your stomach (nauseous). °· Throwing up (vomiting). °· Loss of appetite. °· Fever. °· Constipation. °· Diarrhea. °· Generally not feeling well. °DIAGNOSIS  °· Physical exam. °· Blood tests. °· Urine test. °· X-rays or a CT scan may confirm the diagnosis. °TREATMENT  °Once the diagnosis of appendicitis is made, the most common treatment is to remove the appendix as soon as possible. This procedure is called appendectomy. In an open appendectomy, a cut (incision) is made in the lower right abdomen and the appendix is removed. In a laparoscopic appendectomy, usually 3 small incisions are made. Long, thin instruments and a camera tube are used to remove the appendix. Most patients go home in 24 to 48 hours after appendectomy. °In some situations, the appendix may have already perforated and an abscess may have formed. The abscess may have a "wall" around it as seen on a CT scan. In this case, a drain may be placed into the abscess to remove fluid, and you may be treated with antibiotic medicines that kill germs. The medicine is given through a tube in your vein (IV). Once the abscess has resolved, it may or may not be necessary to have an appendectomy. You may need to stay in the hospital longer than 48 hours. °Document Released:  03/06/2005 Document Revised: 09/05/2011 Document Reviewed: 06/01/2009 °[employer]® Patient Information ©2015 [employer]. This information is not intended to replace advice given to you by your health care provider. Make sure you discuss any questions you have with your health care provider. ° °

## 2014-12-08 NOTE — H&P (Signed)
Patient ID: Michelle Warner, female   DOB: [birth year], 39 y.o.   MRN: [id number] CC: PAIN HPI Michelle Warner is a 39 y.o. female who presented to the emergency department for evaluation of pain. Her initial complaint was of midepigastric pain however this then progressed while in the emergency department to right lower quadrant pain. She states she's never had anything like this before and was in her usual state of health with her chronic medical problems prior to 36 hours ago. 36 hours ago she started having generalized malaise and a loss of appetite. She also states that she had a runny bowel movements during this time as well. She has had subjective chills but no fevers. She denies any chest pain, shortness of breath, nausea, vomiting.  HPI  Past Medical History  Diagnosis Date  . Diabetes mellitus without complication   . Hypertension   . Heart murmur   . Hypercholesterolemia     Past Surgical History  Procedure Laterality Date  . Tonsillectomy    . Cesarean section      No family history on file.  Social History Social History  Substance Use Topics  . Smoking status: Former Research scientist (life sciences)  . Smokeless tobacco: None  . Alcohol Use: No    No Known Allergies  No current facility-administered medications for this encounter.   Current Outpatient Prescriptions  Medication Sig Dispense Refill  . albuterol (PROVENTIL HFA;VENTOLIN HFA) 108 (90 BASE) MCG/ACT inhaler Inhale 2 puffs into the lungs every 6 (six) hours as needed for wheezing or shortness of breath. 1 Inhaler 0  . benzonatate (TESSALON) 100 MG capsule Take 1-2 capsules (100-200 mg total) by mouth every 8 (eight) hours as needed for cough. 20 capsule 0  . canagliflozin (INVOKANA) 100 MG TABS tablet Take 1 tablet (100 mg total) by mouth daily. 30 tablet 5  . EQ ALLERGY RELIEF 10 MG tablet TAKE ONE TABLET BY MOUTH ONCE DAILY 30 tablet 5  . ferrous sulfate 325 (65 FE) MG tablet Take 1 tablet (325 mg total) by mouth 2 (two) times daily.  60 tablet 5  . fluticasone (FLONASE) 50 MCG/ACT nasal spray Place 2 sprays into both nostrils daily. 16 g 2  . glipiZIDE (GLUCOTROL) 10 MG tablet Take 1 tablet (10 mg total) by mouth 2 (two) times daily. 60 tablet 5  . lisinopril-hydrochlorothiazide (PRINZIDE,ZESTORETIC) 20-12.5 MG per tablet Take 1 tablet by mouth daily. 30 tablet 5  . loratadine (CLARITIN) 10 MG tablet Take 10 mg by mouth daily.    Marland Kitchen lovastatin (MEVACOR) 20 MG tablet Take 1 tablet (20 mg total) by mouth at bedtime. 30 tablet 5  . metFORMIN (GLUCOPHAGE) 500 MG tablet Take 1 tablet (500 mg total) by mouth 2 (two) times daily. 60 tablet 5  . norgestimate-ethinyl estradiol (ORTHO-CYCLEN,SPRINTEC,PREVIFEM) 0.25-35 MG-MCG tablet Take 1 tablet by mouth daily. 1 Package 5  . omeprazole (PRILOSEC) 40 MG capsule Take 1 capsule (40 mg total) by mouth daily. 30 capsule 5  . traZODone (DESYREL) 50 MG tablet Take 1 tablet (50 mg total) by mouth at bedtime as needed. 30 tablet 5     Review of Systems A multipoint review of systems was completed. All pertinent positives and negatives within the history of present illness remainder negative.  Physical Exam Blood pressure 133/80, pulse 89, temperature 98.5 F (36.9 C), temperature source Oral, resp. rate 18, height 5\' 2"  (1.575 m), weight 101.152 kg (223 lb), last menstrual period 12/04/2014, SpO2 100 %. CONSTITUTIONAL: Resting in bed in no  acute distress. EYES: Pupils are equal, round, and reactive to light, Sclera are non-icteric. EARS, NOSE, MOUTH AND THROAT: The oropharynx is clear. The oral mucosa is pink and moist. Hearing is intact to voice. LYMPH NODES:  Lymph nodes in the neck are normal. RESPIRATORY:  Lungs are clear. There is normal respiratory effort, with equal breath sounds bilaterally, and without pathologic use of accessory muscles. CARDIOVASCULAR: Heart is regular without murmurs, gallops, or rubs. GI: The abdomen is extremely large, soft, tender to palpation in the right  lower quadrant. There are no palpable masses. There is no hepatosplenomegaly. There are normal bowel sounds in all quadrants. GU: Rectal deferred.   MUSCULOSKELETAL: Normal muscle strength and tone. No cyanosis or edema.   SKIN: Turgor is good and there are no pathologic skin lesions or ulcers. NEUROLOGIC: Motor and sensation is grossly normal. Cranial nerves are grossly intact. PSYCH:  Oriented to person, place and time. Affect is normal.  Data Review: I reviewed the images and labs. Her laboratory workup is concerning for leukocytosis of 18.2. Her CT scan was initially performed as a noncontrast stone protocol CT scan which shows a dilated appendix with surrounding inflammation. This is visible due to the large amount of adipose tissue I have personally reviewed the patient's imaging, laboratory findings and medical records.    Assessment     39 year old obese female with acute appendicitis    Plan     discussed in detail the patient and her husband the diagnosis of appendicitis as well as the treatment for this. Discussed in detail the surgery of a laparoscopic appendectomy to include its risks, benefits, alternatives. Patient and her husband voiced understanding of both wish to proceed. Also discussed the surgery was performed by my partner Dr. Felton Clinton when he comes on shift later this evening.  We'll plan for admission postoperatively for a observation.     Time spent with the patient was 30 minutes, with more than 50% of the time spent in face-to-face education, counseling and care coordination.     Clayburn Pert 12/08/2014, 6:12 PM

## 2014-12-08 NOTE — Anesthesia Preprocedure Evaluation (Signed)
Anesthesia Evaluation  Patient identified by MRN, date of birth, ID band Patient awake    Reviewed: Allergy & Precautions, H&P , NPO status , Patient's Chart, lab work & pertinent test results, reviewed documented beta blocker date and time   History of Anesthesia Complications Negative for: history of anesthetic complications  Airway Mallampati: III  TM Distance: >3 FB Neck ROM: full    Dental no notable dental hx. (+) Missing, Poor Dentition   Pulmonary neg shortness of breath, asthma , sleep apnea , neg COPD, neg recent URI, former smoker,    Pulmonary exam normal breath sounds clear to auscultation       Cardiovascular Exercise Tolerance: Good hypertension, On Medications (-) angina(-) CAD, (-) Past MI, (-) Cardiac Stents and (-) CABG Normal cardiovascular exam(-) dysrhythmias + Valvular Problems/Murmurs  Rhythm:regular Rate:Normal     Neuro/Psych negative neurological ROS  negative psych ROS   GI/Hepatic Neg liver ROS, GERD  Medicated,  Endo/Other  diabetes, Oral Hypoglycemic AgentsMorbid obesity  Renal/GU negative Renal ROS  negative genitourinary   Musculoskeletal   Abdominal   Peds  Hematology  (+) Blood dyscrasia, anemia ,   Anesthesia Other Findings Past Medical History:   Diabetes mellitus without complication                       Hypertension                                                 Heart murmur                                                 Hypercholesterolemia                                         Reproductive/Obstetrics negative OB ROS                             Anesthesia Physical Anesthesia Plan  ASA: III  Anesthesia Plan: General   Post-op Pain Management:    Induction:   Airway Management Planned:   Additional Equipment:   Intra-op Plan:   Post-operative Plan:   Informed Consent: I have reviewed the patients History and Physical, chart, labs  and discussed the procedure including the risks, benefits and alternatives for the proposed anesthesia with the patient or authorized representative who has indicated his/her understanding and acceptance.   Dental Advisory Given  Plan Discussed with: Anesthesiologist, CRNA and Surgeon  Anesthesia Plan Comments:         Anesthesia Quick Evaluation

## 2014-12-08 NOTE — ED Notes (Signed)
Pt reports centralized chest pain today after waking up, reports feeling cold and generalized body aches.

## 2014-12-08 NOTE — Anesthesia Procedure Notes (Signed)
Procedure Name: Intubation Date/Time: 12/08/2014 8:25 PM Performed by: Lendon Colonel Pre-anesthesia Checklist: Patient identified, Emergency Drugs available, Suction available, Patient being monitored and Timeout performed Patient Re-evaluated:Patient Re-evaluated prior to inductionOxygen Delivery Method: Circle system utilized Preoxygenation: Pre-oxygenation with 100% oxygen Intubation Type: IV induction and Rapid sequence Laryngoscope Size: Miller and 2 Grade View: Grade I Tube type: Oral Tube size: 7.0 mm Number of attempts: 1 Airway Equipment and Method: Stylet Placement Confirmation: ETT inserted through vocal cords under direct vision,  positive ETCO2 and breath sounds checked- equal and bilateral Secured at: 20 cm Tube secured with: Tape

## 2014-12-08 NOTE — ED Notes (Signed)
POCT preg neg.  

## 2014-12-08 NOTE — Transfer of Care (Signed)
Immediate Anesthesia Transfer of Care Note  Patient: Michelle Warner  Procedure(s) Performed: Procedure(s): APPENDECTOMY LAPAROSCOPIC (N/A)  Patient Location: PACU  Anesthesia Type:General  Level of Consciousness: awake, alert , oriented and patient cooperative  Airway & Oxygen Therapy: Patient Spontanous Breathing and Patient connected to face mask oxygen  Post-op Assessment: Report given to RN and Post -op Vital signs reviewed and stable  Post vital signs: Reviewed and stable  Last Vitals:  Filed Vitals:   12/08/14 1405  BP: 133/80  Pulse: 89  Temp: 36.9 C  Resp: 18    Complications: No apparent anesthesia complications

## 2014-12-08 NOTE — ED Notes (Signed)
POCT negative 

## 2014-12-08 NOTE — ED Provider Notes (Signed)
[facility] Emergency Department Provider Note  ____________________________________________  Time seen: 3:40 PM  I have reviewed the triage vital signs and the nursing notes.   HISTORY  Chief Complaint Chest Pain and Generalized Body Aches    HPI Michelle Warner is a 39 y.o. female with hypertension and diabetes who complains of generalized chest pain, generalized body aches, and right-sided abdominal pain that she noticed on waking up this morning. She also had some chills but no fever. No nausea vomiting or diarrhea, normal oral intake.  Abdominal pain is achy, moderate, no aggravating or alleviating factors. Never had pain like that before. No dysuria frequency urgency. Nonradiating   Past Medical History  Diagnosis Date  . Diabetes mellitus without complication   . Hypertension   . Heart murmur   . Hypercholesterolemia      Patient Active Problem List   Diagnosis Date Noted  . Body mass index of 60 or higher 08/20/2014  . Cannot sleep 08/20/2014  . Excessive and frequent menstruation 08/20/2014  . Dysmetabolic syndrome 14/48/1856  . Obstructive sleep apnea of adult 08/20/2014  . Type II diabetes mellitus, uncontrolled 08/20/2014  . Migraine without aura and responsive to treatment 05/01/2007  . Allergic rhinitis 10/23/2006  . Gastro-esophageal reflux disease without esophagitis 09/04/2006  . Hypertension goal BP (blood pressure) < 140/90 09/04/2006  . HLD (hyperlipidemia) 09/04/2006  . Anemia, iron deficiency 09/04/2006     Past Surgical History  Procedure Laterality Date  . Tonsillectomy    . Cesarean section       Current Outpatient Rx  Name  Route  Sig  Dispense  Refill  . albuterol (PROVENTIL HFA;VENTOLIN HFA) 108 (90 BASE) MCG/ACT inhaler   Inhalation   Inhale 2 puffs into the lungs every 6 (six) hours as needed for wheezing or shortness of breath.   1 Inhaler   0   . benzonatate (TESSALON) 100 MG capsule   Oral   Take  1-2 capsules (100-200 mg total) by mouth every 8 (eight) hours as needed for cough.   20 capsule   0   . canagliflozin (INVOKANA) 100 MG TABS tablet   Oral   Take 1 tablet (100 mg total) by mouth daily.   30 tablet   5   . EQ ALLERGY RELIEF 10 MG tablet      TAKE ONE TABLET BY MOUTH ONCE DAILY   30 tablet   5   . ferrous sulfate 325 (65 FE) MG tablet   Oral   Take 1 tablet (325 mg total) by mouth 2 (two) times daily.   60 tablet   5   . fluticasone (FLONASE) 50 MCG/ACT nasal spray   Each Nare   Place 2 sprays into both nostrils daily.   16 g   2   . glipiZIDE (GLUCOTROL) 10 MG tablet   Oral   Take 1 tablet (10 mg total) by mouth 2 (two) times daily.   60 tablet   5   . lisinopril-hydrochlorothiazide (PRINZIDE,ZESTORETIC) 20-12.5 MG per tablet   Oral   Take 1 tablet by mouth daily.   30 tablet   5   . loratadine (CLARITIN) 10 MG tablet   Oral   Take 10 mg by mouth daily.         Marland Kitchen lovastatin (MEVACOR) 20 MG tablet   Oral   Take 1 tablet (20 mg total) by mouth at bedtime.   30 tablet   5   . metFORMIN (GLUCOPHAGE) 500  MG tablet   Oral   Take 1 tablet (500 mg total) by mouth 2 (two) times daily.   60 tablet   5   . norgestimate-ethinyl estradiol (ORTHO-CYCLEN,SPRINTEC,PREVIFEM) 0.25-35 MG-MCG tablet   Oral   Take 1 tablet by mouth daily.   1 Package   5   . omeprazole (PRILOSEC) 40 MG capsule   Oral   Take 1 capsule (40 mg total) by mouth daily.   30 capsule   5   . traZODone (DESYREL) 50 MG tablet   Oral   Take 1 tablet (50 mg total) by mouth at bedtime as needed.   30 tablet   5      Allergies Review of patient's allergies indicates no known allergies.   No family history on file.  Social History Social History  Substance Use Topics  . Smoking status: Former Research scientist (life sciences)  . Smokeless tobacco: None  . Alcohol Use: No    Review of Systems  Constitutional:   No fever or chills. No weight changes Eyes:   No blurry vision or double  vision.  ENT:   No sore throat. Cardiovascular:   Diffuse chest pain. Respiratory:   No dyspnea or cough. Gastrointestinal:   Positive for right-sided abdominal pain without vomiting and diarrhea.  No BRBPR or melena. Genitourinary:   Negative for dysuria, urinary retention, bloody urine, or difficulty urinating. Currently on menstrual cycle Musculoskeletal:   Negative for back pain. No joint swelling or pain. Skin:   Negative for rash. Neurological:   Negative for headaches, focal weakness or numbness. Psychiatric:  No anxiety or depression.   Endocrine:  No hot/cold intolerance, changes in energy, or sleep difficulty.  10-point ROS otherwise negative.  ____________________________________________   PHYSICAL EXAM:  VITAL SIGNS: ED Triage Vitals  Enc Vitals Group     BP 12/08/14 1405 133/80 mmHg     Pulse Rate 12/08/14 1405 89     Resp 12/08/14 1405 18     Temp 12/08/14 1405 98.5 F (36.9 C)     Temp Source 12/08/14 1405 Oral     SpO2 12/08/14 1405 100 %     Weight 12/08/14 1405 223 lb (101.152 kg)     Height 12/08/14 1405 5\' 2"  (1.575 m)     Head Cir --      Peak Flow --      Pain Score 12/08/14 1407 8     Pain Loc --      Pain Edu? --      Excl. in [place]? --      Constitutional:   Alert and oriented. Well appearing and in no distress. Eyes:   No scleral icterus. No conjunctival pallor. PERRL. EOMI ENT   Head:   Normocephalic and atraumatic.   Nose:   No congestion/rhinnorhea. No septal hematoma   Mouth/Throat:   MMM, no pharyngeal erythema. No peritonsillar mass. No uvula shift.   Neck:   No stridor. No SubQ emphysema. No meningismus. Hematological/Lymphatic/Immunilogical:   No cervical lymphadenopathy. Cardiovascular:   RRR. Normal and symmetric distal pulses are present in all extremities. No murmurs, rubs, or gallops. Respiratory:   Normal respiratory effort without tachypnea nor retractions. Breath sounds are clear and equal bilaterally. No  wheezes/rales/rhonchi. Gastrointestinal:   Soft with moderate right lower quadrant tenderness.. No distention. There is no CVA tenderness.  No rebound, rigidity, or guarding. Genitourinary:   deferred Musculoskeletal:   Nontender with normal range of motion in all extremities. No joint effusions.  No lower extremity  tenderness.  No edema. Neurologic:   Normal speech and language.  CN 2-10 normal. Motor grossly intact. No pronator drift.  Normal gait. No gross focal neurologic deficits are appreciated.  Skin:    Skin is warm, dry and intact. No rash noted.  No petechiae, purpura, or bullae. Psychiatric:   Mood and affect are normal. Speech and behavior are normal. Patient exhibits appropriate insight and judgment.  ____________________________________________    LABS (pertinent positives/negatives) (all labs ordered are listed, but only abnormal results are displayed) Labs Reviewed  BASIC METABOLIC PANEL - Abnormal; Notable for the following:    Glucose, Bld 247 (*)    All other components within normal limits  CBC - Abnormal; Notable for the following:    WBC 18.2 (*)    All other components within normal limits  URINALYSIS COMPLETEWITH MICROSCOPIC ([facility] ONLY) - Abnormal; Notable for the following:    Color, Urine YELLOW (*)    APPearance HAZY (*)    Glucose, UA 50 (*)    Ketones, ur TRACE (*)    Hgb urine dipstick 3+ (*)    Protein, ur 30 (*)    Leukocytes, UA TRACE (*)    Bacteria, UA RARE (*)    Squamous Epithelial / LPF 6-30 (*)    All other components within normal limits  TROPONIN I   ____________________________________________   EKG  Interpreted by me Sinus tachycardia rate 104, normal axis intervals QRS ST segments and T waves.  ____________________________________________    RADIOLOGY  CT abdomen and pelvis reveals early  appendicitis  ____________________________________________   PROCEDURES   ____________________________________________   INITIAL IMPRESSION / ASSESSMENT AND PLAN / ED COURSE  Pertinent labs & imaging results that were available during my care of the patient were reviewed by me and considered in my medical decision making (see chart for details).  Patient presents with a multitude of complaints but exam is significant for right lower quadrant tenderness. The patient with diabetes is also complaining of chills and being found to have leukocytosis and concern about appendicitis. The significant blood on urinalysis is also suggestive of renal colic although the patient doesn't have a history of this and is counseled blood in her urine as being part of her period. This may be the case. We'll get a noncontrast CT of the abdomen pelvis, as she does have a lot of abdominal fat that will allow for good visualization of the appendix even without IV contrast.  ----------------------------------------- 5:31 PM on 12/08/2014 -----------------------------------------  CT is positive for appendicitis radiographically. Case discussed with surgery Dr. Adonis Huguenin at 5:30 PM to evaluate for admission. Low suspicion for any serious cardiopulmonary pathology that is the cause of her chest pain. No evidence for ACS PE TAD pneumothorax carditis mediastinitis pneumonia or sepsis.     ____________________________________________   FINAL CLINICAL IMPRESSION(S) / ED DIAGNOSES  Final diagnoses:  Acute appendicitis with localized peritonitis      Carrie Mew, MD 12/08/14 1732

## 2014-12-08 NOTE — OR Nursing (Signed)
Per voided prior to arrival to OR

## 2014-12-09 ENCOUNTER — Encounter: Payer: Self-pay | Admitting: Surgery

## 2014-12-09 LAB — BASIC METABOLIC PANEL
ANION GAP: 11 (ref 5–15)
BUN: 8 mg/dL (ref 6–20)
CALCIUM: 8.2 mg/dL — AB (ref 8.9–10.3)
CHLORIDE: 104 mmol/L (ref 101–111)
CO2: 21 mmol/L — AB (ref 22–32)
Creatinine, Ser: 0.63 mg/dL (ref 0.44–1.00)
GFR calc non Af Amer: >60 mL/min (ref >60)
Glucose, Bld: 309 mg/dL — ABNORMAL HIGH (ref 65–99)
Potassium: 3.7 mmol/L (ref 3.5–5.1)
SODIUM: 136 mmol/L (ref 135–145)

## 2014-12-09 LAB — GLUCOSE, CAPILLARY
GLUCOSE-CAPILLARY: 242 mg/dL — AB (ref 65–99)
Glucose-Capillary: 277 mg/dL — ABNORMAL HIGH (ref 65–99)
Glucose-Capillary: 242 mg/dL — ABNORMAL HIGH (ref 65–99)

## 2014-12-09 NOTE — Progress Notes (Signed)
Pt admitted to [facility] from PACU.  VSS. Alert and oriented x3.  No n/v noted.  Morphine given once for headache.  Voiding without difficulty.  Encouraged pt to ambulate.  Tolerating clear liquid diet at this time. Will advance diet to full liquid for breakfast per MD orders.  Will cont to monitor.

## 2014-12-09 NOTE — Anesthesia Postprocedure Evaluation (Signed)
  Anesthesia Post-op Note  Patient: Michelle Warner  Procedure(s) Performed: Procedure(s): APPENDECTOMY LAPAROSCOPIC (N/A)  Anesthesia type:General  Patient location: PACU  Post pain: Pain level controlled  Post assessment: Post-op Vital signs reviewed, Patient's Cardiovascular Status Stable, Respiratory Function Stable, Patent Airway and No signs of Nausea or vomiting  Post vital signs: Reviewed and stable  Last Vitals:  Filed Vitals:   12/09/14 0604  BP: 112/63  Pulse: 109  Temp: 37.2 C  Resp: 18    Level of consciousness: awake, alert  and patient cooperative  Complications: No apparent anesthesia complications

## 2014-12-09 NOTE — Progress Notes (Signed)
1 Day Post-Op   Subjective:  39 year old female postop day #1 status post laparoscopic appendectomy. Patient reports that she feels better than she did before surgery however she is not interested in going home at this time. She has tolerated a liquid diet and has pain control with oral pain medication currently.  Vital signs in last 24 hours: Temp:  [97.3 F (36.3 C)-99 F (37.2 C)] 97.9 F (36.6 C) (09/21 0947) Pulse Rate:  [76-134] 100 (09/21 0947) Resp:  [17-20] 17 (09/21 0947) BP: (112-140)/(61-83) 119/74 mmHg (09/21 0947) SpO2:  [93 %-100 %] 95 % (09/21 0947) Weight:  [101.152 kg (223 lb)-119.931 kg (264 lb 6.4 oz)] 118.4 kg (261 lb 0.4 oz) (09/21 0600) Last BM Date: 12/08/14  Intake/Output from previous day: 09/20 0701 - 09/21 0700 In: 1727 [I.V.:1727] Out: 600 [Urine:600]  GI: Very large abdomen. Soft, relatively tender to palpation at incision sites. Incisions without any evidence of infection.  Lab Results:  CBC  Recent Labs  12/08/14 1417  WBC 18.2*  HGB 12.7  HCT 38.4  PLT 365   CMP     Component Value Date/Time   NA 136 12/09/2014 0003   NA 140 08/24/2011 0259   K 3.7 12/09/2014 0003   K 4.0 08/24/2011 0259   CL 104 12/09/2014 0003   CL 105 08/24/2011 0259   CO2 21* 12/09/2014 0003   CO2 25 08/24/2011 0259   GLUCOSE 309* 12/09/2014 0003   GLUCOSE 254* 08/24/2011 0259   BUN 8 12/09/2014 0003   BUN 7 08/24/2011 0259   CREATININE 0.63 12/09/2014 0003   CREATININE 0.71 08/24/2011 0259   CALCIUM 8.2* 12/09/2014 0003   CALCIUM 8.9 08/24/2011 0259   GFRNONAA >60 12/09/2014 0003   GFRNONAA >60 08/24/2011 0259   GFRAA >60 12/09/2014 0003   GFRAA >60 08/24/2011 0259   PT/INR No results for input(s): LABPROT, INR in the last 72 hours.  Studies/Results: Dg Chest 2 View  12/08/2014   CLINICAL DATA:  Generalized chest pain since this morning.  EXAM: CHEST  2 VIEW  COMPARISON:  September 19, 2014  FINDINGS: The heart size and mediastinal contours are within  normal limits. There is no focal infiltrate, pulmonary edema, or pleural effusion. The visualized skeletal structures are stable.  IMPRESSION: No active cardiopulmonary disease.   Electronically Signed   By: Abelardo Diesel M.D.   On: 12/08/2014 16:24   Ct Renal Stone Study  12/08/2014   CLINICAL DATA:  Right flank pain and hematuria. Chills and body aches.  EXAM: CT ABDOMEN AND PELVIS WITHOUT CONTRAST  TECHNIQUE: Multidetector CT imaging of the abdomen and pelvis was performed following the standard protocol without IV contrast.  COMPARISON:  05/11/2014  FINDINGS: Lower chest: No pulmonary nodules, pleural effusions, or infiltrates. Heart size is normal. No imaged pericardial effusion or significant coronary artery calcifications.  Upper abdomen: No hydronephrosis. No intrarenal or ureteral stones. The gallbladder is present. No focal abnormality identified within the liver, spleen, pancreas, or adrenal glands.  Gastrointestinal tract: Stomach and small bowel loops are normal in appearance. The appendix is mildly thickened, measuring 9 mm. There is periappendiceal stranding consistent with acute appendicitis. Colonic loops are normal in appearance.  Pelvis: The uterus is present. Uterus appears enlarged and somewhat globular, suggesting multiple fibroids. Adnexal regions are normal in appearance. No free pelvic fluid.  Retroperitoneum: No evidence for aortic aneurysm. Small right lower quadrant mesenteric lymph nodes are identified and felt to be reactive.  Abdominal wall: Unremarkable.  Osseous structures:  Unremarkable.  IMPRESSION: 1. Mild inflammatory changes surrounding the mildly thickened appendix consistent with early appendicitis. No evidence for rupture or abscess. 2. Probably reactive small right lower quadrant mesenteric lymph nodes. 3. Globular enlarged uterus consistent with fibroids.   Electronically Signed   By: Nolon Nations M.D.   On: 12/08/2014 17:06    Assessment/Plan: 39 year old female  status post laparoscopic appendectomy We will advance diet regular for lunch If able to tolerate regular diet and had pain control on all oral medications we'll plan for discharge as afternoon.   Clayburn Pert, MD FACS General Surgeon [facility]

## 2014-12-09 NOTE — Progress Notes (Signed)
Pt VSS; Tolerating diet, Resting well, Pt received discharge orders. Instructions were reviewed with pt with all questions answered. IV removed with dressing dry and intact. Prescriptions given to pt. Pt escorted out via wheelchair.

## 2014-12-09 NOTE — Discharge Summary (Signed)
Patient ID: Michelle Warner MRN: [id number] DOB/AGE: November 20, 1975 39 y.o.  Admit date: 12/08/2014 Discharge date: 12/09/2014  Discharge Diagnoses:  Appendicitis  Procedures Performed: Laparoscopic appendectomy  Discharged Condition: good  Hospital Course: Taken the operating room from the emergency department for a laparoscopic appendectomy. Patient tolerated procedure well and was able to tolerate a regular diet and by mouth pain medications prior to discharge.  Discharge Orders:  discharge home. Okay to shower. Do not submerge her incision for 10 days (no bath tub, hot tub, swimming pool)  Disposition: 01-Home or Self Care  Discharge Medications:  Current facility-administered medications:  .  acetaminophen (TYLENOL) tablet 650 mg, 650 mg, Oral, Q6H PRN **OR** acetaminophen (TYLENOL) suppository 650 mg, 650 mg, Rectal, Q6H PRN, Sherri Rad, MD .  albuterol (PROVENTIL) (2.5 MG/3ML) 0.083% nebulizer solution 2.5 mg, 2.5 mg, Nebulization, Q6H PRN, Sherri Rad, MD .  dextrose 5 % and 0.45 % NaCl with KCl 20 mEq/L infusion, , Intravenous, Continuous, Sherri Rad, MD, Last Rate: 125 mL/hr at 12/09/14 1119 .  diphenhydrAMINE (BENADRYL) capsule 25 mg, 25 mg, Oral, Q6H PRN **OR** diphenhydrAMINE (BENADRYL) injection 25 mg, 25 mg, Intravenous, Q6H PRN, Clayburn Pert, MD .  hydrALAZINE (APRESOLINE) injection 10 mg, 10 mg, Intravenous, Q2H PRN, Clayburn Pert, MD .  HYDROcodone-acetaminophen (NORCO/VICODIN) 5-325 MG per tablet 1-2 tablet, 1-2 tablet, Oral, Q4H PRN, Sherri Rad, MD, 2 tablet at 12/09/14 1121 .  insulin aspart (novoLOG) injection 0-15 Units, 0-15 Units, Subcutaneous, TID WC, Sherri Rad, MD, 5 Units at 12/09/14 1302 .  morphine 2 MG/ML injection 2 mg, 2 mg, Intravenous, Q2H PRN, Sherri Rad, MD, 2 mg at 12/08/14 2332 .  ondansetron (ZOFRAN) tablet 4 mg, 4 mg, Oral, Q6H PRN **OR** ondansetron (ZOFRAN) injection 4 mg, 4 mg, Intravenous, Q6H PRN, Sherri Rad, MD  Follwup: Follow-up Information     Follow up with Sherri Rad, MD In 1 week.   Specialties:  Surgery, Radiology   Why:  For suture removal, For wound re-check   Contact information:   [address] [place]  [address] [phone]       Signed: Clayburn Pert 12/09/2014, 5:24 PM

## 2014-12-10 LAB — SURGICAL PATHOLOGY

## 2014-12-11 ENCOUNTER — Encounter: Payer: Self-pay | Admitting: *Deleted

## 2014-12-11 DIAGNOSIS — Z7182 Exercise counseling: Secondary | ICD-10-CM | POA: Insufficient documentation

## 2014-12-11 DIAGNOSIS — Z87898 Personal history of other specified conditions: Secondary | ICD-10-CM | POA: Insufficient documentation

## 2014-12-11 DIAGNOSIS — R809 Proteinuria, unspecified: Secondary | ICD-10-CM | POA: Insufficient documentation

## 2014-12-11 DIAGNOSIS — IMO0002 Reserved for concepts with insufficient information to code with codable children: Secondary | ICD-10-CM | POA: Insufficient documentation

## 2014-12-11 DIAGNOSIS — Z713 Dietary counseling and surveillance: Secondary | ICD-10-CM | POA: Insufficient documentation

## 2014-12-16 ENCOUNTER — Ambulatory Visit (INDEPENDENT_AMBULATORY_CARE_PROVIDER_SITE_OTHER): Payer: Self-pay | Admitting: Surgery

## 2014-12-16 ENCOUNTER — Encounter: Payer: Self-pay | Admitting: Surgery

## 2014-12-16 VITALS — BP 146/82 | HR 103 | Temp 98.1°F | Ht 62.0 in | Wt 216.0 lb

## 2014-12-16 DIAGNOSIS — K353 Acute appendicitis with localized peritonitis, without perforation or gangrene: Secondary | ICD-10-CM

## 2014-12-16 NOTE — Progress Notes (Signed)
Surgery clinic.  The patient is status post laparoscopic appendectomy for early appendicitis. This was performed 20th of September 2016.   She was discharged home the following day. She is doing well. She is complaining of some mild pain around her umbilicus. She was unable to fill the narcotics prescription has been using Tylenol and Motrin. She does not work. She is complaining of some mild abdominal discomfort with eating.  No nausea no vomiting and no obstipation.   On physical examination she is in no distress. Her abdomen is soft. Incisions healed nicely. There is no ecchymosis no purulent drainage. The patient is morbidly obese.  Impression doing well 8 days status post appendectomy for early appendicitis.  Plan:   no heavy lifting for a total of 3 weeks from the time of surgery. The patient did not request any type of pain medication in the office I encouraged her to use over-the-counter Motrin Naprosyn or Tylenol for pain. I will see her back in the office as needed.

## 2014-12-16 NOTE — Patient Instructions (Signed)
Follow up in office as needed 

## 2015-01-08 ENCOUNTER — Other Ambulatory Visit: Payer: Self-pay | Admitting: Family Medicine

## 2015-01-08 DIAGNOSIS — G4700 Insomnia, unspecified: Secondary | ICD-10-CM

## 2015-01-08 NOTE — Telephone Encounter (Signed)
Pt would like refill on trazodone. Pt has not been seen since 07/15/14 and does not have an appt scheduled. Pt has no insurance at this time.

## 2015-01-11 MED ORDER — TRAZODONE HCL 50 MG PO TABS: 50.0000 mg | ORAL_TABLET | Freq: Every evening | ORAL | Status: DC | PRN

## 2015-01-11 NOTE — Telephone Encounter (Signed)
Refill request was sent to Dr. Ashany Sundaram for approval and submission.  

## 2015-02-09 ENCOUNTER — Other Ambulatory Visit: Payer: Self-pay

## 2015-02-09 DIAGNOSIS — G4700 Insomnia, unspecified: Secondary | ICD-10-CM

## 2015-02-09 DIAGNOSIS — N920 Excessive and frequent menstruation with regular cycle: Secondary | ICD-10-CM

## 2015-02-09 DIAGNOSIS — E785 Hyperlipidemia, unspecified: Secondary | ICD-10-CM

## 2015-02-09 DIAGNOSIS — I10 Essential (primary) hypertension: Secondary | ICD-10-CM

## 2015-02-09 DIAGNOSIS — K219 Gastro-esophageal reflux disease without esophagitis: Secondary | ICD-10-CM

## 2015-02-09 DIAGNOSIS — J301 Allergic rhinitis due to pollen: Secondary | ICD-10-CM

## 2015-02-09 DIAGNOSIS — D509 Iron deficiency anemia, unspecified: Secondary | ICD-10-CM

## 2015-02-09 NOTE — Telephone Encounter (Signed)
Patient came in with her parents (dad was being seen by Dr. Rutherford Nail) and asked if all of her medications be refilled. She stated that she only has temporary medicaid and that it does not cover her visits but she can get her medications.  I told her that I would run it by Dr. Nadine Counts and will see if she could send them in. She requested that they be sent to [facility] ([address]  Refill request was sent to Dr. [name] for approval and submission.

## 2015-02-15 MED ORDER — METFORMIN HCL 500 MG PO TABS: 500.0000 mg | ORAL_TABLET | Freq: Two times a day (BID) | ORAL | Status: DC

## 2015-02-15 MED ORDER — LOVASTATIN 20 MG PO TABS: 20.0000 mg | ORAL_TABLET | Freq: Every day | ORAL | Status: DC

## 2015-02-15 MED ORDER — LISINOPRIL-HYDROCHLOROTHIAZIDE 20-12.5 MG PO TABS: 1.0000 | ORAL_TABLET | Freq: Every day | ORAL | Status: DC

## 2015-02-15 MED ORDER — GLIPIZIDE 10 MG PO TABS: 10.0000 mg | ORAL_TABLET | Freq: Two times a day (BID) | ORAL | Status: DC

## 2015-02-24 ENCOUNTER — Other Ambulatory Visit: Payer: Self-pay | Admitting: Family Medicine

## 2015-02-24 DIAGNOSIS — N920 Excessive and frequent menstruation with regular cycle: Secondary | ICD-10-CM

## 2015-02-24 MED ORDER — NORGESTIMATE-ETH ESTRADIOL 0.25-35 MG-MCG PO TABS: 1.0000 | ORAL_TABLET | Freq: Every day | ORAL | Status: DC

## 2015-02-24 NOTE — Telephone Encounter (Signed)
Pt states she needs refill on her birth control to be sent to [facility].

## 2015-02-24 NOTE — Telephone Encounter (Signed)
Refill request was sent to Dr. Ashany Sundaram for approval and submission.  

## 2015-04-08 ENCOUNTER — Encounter: Payer: Self-pay | Admitting: Family Medicine

## 2015-04-09 ENCOUNTER — Telehealth: Payer: Self-pay | Admitting: Family Medicine

## 2015-04-09 NOTE — Telephone Encounter (Signed)
Last seen 08-14-14: patient requesting refill on trazodone, lisinopril, lovastatin, loratadine and sprintec. She is asking that you please send to [facility]. I advised her to schedule appointment however she stated that she can not afford to come in at this time, due to not having insurance and asked that I ask for you to refill all her medications.

## 2015-04-11 NOTE — Telephone Encounter (Signed)
Please convey to patient that unfortunately I am moving out of state and leaving the practice in March and I will not be able to refill her medications since she has not followed up since 07/2014.  Redirect her to free clinics in the area such as [facility] [place].

## 2015-04-12 NOTE — Telephone Encounter (Signed)
Left voice mail

## 2015-04-26 DIAGNOSIS — Z87891 Personal history of nicotine dependence: Secondary | ICD-10-CM | POA: Insufficient documentation

## 2015-04-26 DIAGNOSIS — E119 Type 2 diabetes mellitus without complications: Secondary | ICD-10-CM | POA: Insufficient documentation

## 2015-04-26 DIAGNOSIS — D259 Leiomyoma of uterus, unspecified: Secondary | ICD-10-CM | POA: Insufficient documentation

## 2015-04-26 DIAGNOSIS — I10 Essential (primary) hypertension: Secondary | ICD-10-CM | POA: Insufficient documentation

## 2015-04-26 DIAGNOSIS — Z79899 Other long term (current) drug therapy: Secondary | ICD-10-CM | POA: Insufficient documentation

## 2015-04-26 DIAGNOSIS — Z793 Long term (current) use of hormonal contraceptives: Secondary | ICD-10-CM | POA: Insufficient documentation

## 2015-04-26 DIAGNOSIS — Z7984 Long term (current) use of oral hypoglycemic drugs: Secondary | ICD-10-CM | POA: Insufficient documentation

## 2015-04-26 DIAGNOSIS — Z7951 Long term (current) use of inhaled steroids: Secondary | ICD-10-CM | POA: Insufficient documentation

## 2015-04-27 ENCOUNTER — Emergency Department: Payer: Medicaid Other

## 2015-04-27 ENCOUNTER — Emergency Department
Admission: EM | Admit: 2015-04-27 | Discharge: 2015-04-27 | Disposition: A | Discharge status: Home / Self Care | Payer: Self-pay | Attending: Emergency Medicine | Admitting: Emergency Medicine

## 2015-04-27 DIAGNOSIS — R102 Pelvic and perineal pain: Secondary | ICD-10-CM

## 2015-04-27 DIAGNOSIS — D259 Leiomyoma of uterus, unspecified: Secondary | ICD-10-CM

## 2015-04-27 LAB — BASIC METABOLIC PANEL
ANION GAP: 5 (ref 5–15)
BUN: 9 mg/dL (ref 6–20)
CALCIUM: 8.6 mg/dL — AB (ref 8.9–10.3)
CO2: 26 mmol/L (ref 22–32)
CREATININE: 0.62 mg/dL (ref 0.44–1.00)
Chloride: 108 mmol/L (ref 101–111)
Glucose, Bld: 222 mg/dL — ABNORMAL HIGH (ref 65–99)
Potassium: 4.1 mmol/L (ref 3.5–5.1)
SODIUM: 139 mmol/L (ref 135–145)

## 2015-04-27 LAB — CBC
HEMATOCRIT: 31.9 % — AB (ref 35.0–47.0)
Hemoglobin: 11 g/dL — ABNORMAL LOW (ref 12.0–16.0)
MCH: 29.2 pg (ref 26.0–34.0)
MCHC: 34.6 g/dL (ref 32.0–36.0)
MCV: 84.5 fL (ref 80.0–100.0)
PLATELETS: 357 10*3/uL (ref 150–440)
RBC: 3.78 MIL/uL — ABNORMAL LOW (ref 3.80–5.20)
RDW: 14.5 % (ref 11.5–14.5)
WBC: 6.6 10*3/uL (ref 3.6–11.0)

## 2015-04-27 NOTE — ED Notes (Signed)
Pt returned from [place], family at [facility]

## 2015-04-27 NOTE — ED Notes (Signed)
PIV placed, Labs sent, pt resting

## 2015-04-27 NOTE — ED Notes (Signed)
Patient transported to Ultrasound 

## 2015-04-27 NOTE — ED Provider Notes (Signed)
[facility] Emergency Department Provider Note  ____________________________________________  Time seen: 1:00 AM  I have reviewed the triage vital signs and the nursing notes.   HISTORY  Chief Complaint Vaginal Bleeding     HPI Michelle Warner is a 40 y.o. female presents with one-month history of vaginal bleeding. Patient states that she uses approximate 5 pads today. Of note patient states following her appendectomy she was informed that she would need a hysterectomy for "enlarged ovaries. Patient admits to knowing that she had a small fibroid long time ago. Of note the patient's currently taken oral birth control pills.     Past Medical History  Diagnosis Date  . Diabetes mellitus without complication   . Hypertension   . Heart murmur   . Hypercholesterolemia   . Appendicitis     Patient Active Problem List   Diagnosis Date Noted  . Exercise counseling 12/11/2014  . Gravida 1 12/11/2014  . Parity 1 12/11/2014  . Abnormal presence of protein in urine 12/11/2014  . Dietary counseling and surveillance 12/11/2014  . Appendicitis 12/08/2014  . Body mass index of 60 or higher ([place]) 08/20/2014  . Cannot sleep 08/20/2014  . Excessive and frequent menstruation 08/20/2014  . Dysmetabolic syndrome [id number]  . Obstructive sleep apnea of adult 08/20/2014  . Type II diabetes mellitus, uncontrolled ([place]) 08/20/2014  . Extreme obesity ([place]) 11/26/2007  . Allergic rhinitis, seasonal 09/04/2007  . Blood in the urine 08/16/2007  . Routine general medical examination at a health care facility 08/16/2007  . Migraine without aura and responsive to treatment 05/01/2007  . Undiagnosed cardiac murmurs 05/01/2007  . Allergic rhinitis 10/23/2006  . Gastro-esophageal reflux disease without esophagitis 09/04/2006  . Hypertension goal BP (blood pressure) < 140/90 09/04/2006  . HLD (hyperlipidemia) 09/04/2006  . Anemia, iron deficiency 09/04/2006    Past Surgical  History  Procedure Laterality Date  . Tonsillectomy    . Cesarean section    . Laparoscopic appendectomy N/A 12/08/2014    Procedure: APPENDECTOMY LAPAROSCOPIC;  Surgeon: Sherri Rad, MD;  Location: [facility] ORS;  Service: General;  Laterality: N/A;    Current Outpatient Rx  Name  Route  Sig  Dispense  Refill  . albuterol (PROVENTIL) (2.5 MG/3ML) 0.083% nebulizer solution   Nebulization   Take 2.5 mg by nebulization every 6 (six) hours as needed for wheezing or shortness of breath.         . ferrous sulfate 325 (65 FE) MG tablet   Oral   Take 1 tablet (325 mg total) by mouth 2 (two) times daily.   60 tablet   5   . fluticasone (FLONASE) 50 MCG/ACT nasal spray   Each Nare   Place 2 sprays into both nostrils daily.   16 g   2   . glipiZIDE (GLUCOTROL) 10 MG tablet   Oral   Take 1 tablet (10 mg total) by mouth 2 (two) times daily.   60 tablet   1   . HYDROcodone-acetaminophen (NORCO) 5-325 MG per tablet   Oral   Take 1-2 tablets by mouth every 6 (six) hours as needed for moderate pain. Patient not taking: Reported on 12/16/2014   45 tablet   0   . lisinopril-hydrochlorothiazide (PRINZIDE,ZESTORETIC) 20-12.5 MG tablet   Oral   Take 1 tablet by mouth daily.   30 tablet   1   . loratadine (CLARITIN) 10 MG tablet   Oral   Take 10 mg by mouth at bedtime.          Marland Kitchen  lovastatin (MEVACOR) 20 MG tablet   Oral   Take 1 tablet (20 mg total) by mouth at bedtime.   30 tablet   1   . metFORMIN (GLUCOPHAGE) 500 MG tablet   Oral   Take 1 tablet (500 mg total) by mouth 2 (two) times daily.   60 tablet   1   . norgestimate-ethinyl estradiol (ORTHO-CYCLEN,SPRINTEC,PREVIFEM) 0.25-35 MG-MCG tablet   Oral   Take 1 tablet by mouth daily.   1 Package   4   . omeprazole (PRILOSEC) 40 MG capsule   Oral   Take 1 capsule (40 mg total) by mouth daily.   30 capsule   5   . traZODone (DESYREL) 50 MG tablet   Oral   Take 1 tablet (50 mg total) by mouth at bedtime as needed.   30  tablet   5     Allergies No known drug allergies  Family History  Problem Relation Age of Onset  . Hypertension Mother   . Heart disease Mother   . Hypertension Father   . Stroke Father   . Heart disease Father     Social History Social History  Substance Use Topics  . Smoking status: Former Research scientist (life sciences)  . Smokeless tobacco: Former Systems developer    Types: Snuff  . Alcohol Use: No    Review of Systems  Constitutional: Negative for fever. Eyes: Negative for visual changes. ENT: Negative for sore throat. Cardiovascular: Negative for chest pain. Respiratory: Negative for shortness of breath. Gastrointestinal: Negative for abdominal pain, vomiting and diarrhea. Genitourinary: Negative for dysuria. Musculoskeletal: Negative for back pain. Skin: Negative for rash. Neurological: Negative for headaches, focal weakness or numbness.  10-point ROS otherwise negative.  ____________________________________________   PHYSICAL EXAM:  VITAL SIGNS: ED Triage Vitals  Enc Vitals Group     BP 04/26/15 2152 139/90 mmHg     Pulse Rate 04/26/15 2152 99     Resp 04/26/15 2152 18     Temp 04/26/15 2152 98.3 F (36.8 C)     Temp Source 04/26/15 2152 Oral     SpO2 04/26/15 2152 100 %     Weight 04/26/15 2152 215 lb (97.523 kg)     Height 04/26/15 2152 5\' 2"  (1.575 m)     Head Cir --      Peak Flow --      Pain Score 04/26/15 2153 10     Pain Loc --      Pain Edu? --      Excl. in [place]? --      Constitutional: Alert and oriented. Well appearing and in no distress. Eyes: Conjunctivae are normal. PERRL. Normal extraocular movements. ENT   Head: Normocephalic and atraumatic.   Nose: No congestion/rhinnorhea.   Mouth/Throat: Mucous membranes are moist.   Neck: No stridor. Hematological/Lymphatic/Immunilogical: No cervical lymphadenopathy. Cardiovascular: Normal rate, regular rhythm. Normal and symmetric distal pulses are present in all extremities. No murmurs, rubs, or  gallops. Respiratory: Normal respiratory effort without tachypnea nor retractions. Breath sounds are clear and equal bilaterally. No wheezes/rales/rhonchi. Gastrointestinal: Soft and nontender. No distention. There is no CVA tenderness. Genitourinary: deferred Musculoskeletal: Nontender with normal range of motion in all extremities. No joint effusions.  No lower extremity tenderness nor edema. Neurologic:  Normal speech and language. No gross focal neurologic deficits are appreciated. Speech is normal.  Skin:  Skin is warm, dry and intact. No rash noted. Psychiatric: Mood and affect are normal. Speech and behavior are normal. Patient exhibits appropriate insight and  judgment.  ____________________________________________    LABS (pertinent positives/negatives)  Labs Reviewed  CBC - Abnormal; Notable for the following:    RBC 3.78 (*)    Hemoglobin 11.0 (*)    HCT 31.9 (*)    All other components within normal limits  BASIC METABOLIC PANEL - Abnormal; Notable for the following:    Glucose, Bld 222 (*)    Calcium 8.6 (*)    All other components within normal limits  URINALYSIS COMPLETEWITH MICROSCOPIC ([facility] ONLY)       RADIOLOGY    US Pelvis Complete (Final result) Result time: 04/27/15 03:04:27   Final result by Rad Results In Interface (04/27/15 03:04:27)   Narrative:   CLINICAL DATA: Subacute onset of vaginal bleeding and pelvic pain. Initial encounter.  EXAM: TRANSABDOMINAL AND TRANSVAGINAL ULTRASOUND OF PELVIS  TECHNIQUE: Both transabdominal and transvaginal ultrasound examinations of the pelvis were performed. Transabdominal technique was performed for global imaging of the pelvis including uterus, ovaries, adnexal regions, and pelvic cul-de-sac. It was necessary to proceed with endovaginal exam following the transabdominal exam to visualize the uterus and ovaries in greater detail.  COMPARISON: CT of the abdomen and pelvis from 12/08/2014, and pelvic  ultrasound performed 08/24/2011  FINDINGS: Uterus  Measurements: 9.4 x 6.4 x 7.1 cm. Several uterine fibroids are seen, the largest of which measures 3.5 x 2.8 x 2.6 cm, adjacent to the endometrial echo complex. Nabothian cysts are seen at the cervix.  Endometrium  Thickness: 0.8 cm. No focal abnormality visualized.  Right ovary  Not visualized.  Left ovary  Not visualized.  Other findings  No abnormal free fluid.  IMPRESSION: Several fibroids noted within the uterus, the largest of which measures 3.5 cm, adjacent to the endometrial echo complex. Uterus otherwise unremarkable. Ovaries not visualized on this study.   Electronically Signed By: Garald Balding M.D. On: 04/27/2015 03:04          US Transvaginal Non-OB (Final result) Result time: 04/27/15 03:04:27   Final result by Rad Results In Interface (04/27/15 03:04:27)   Narrative:   CLINICAL DATA: Subacute onset of vaginal bleeding and pelvic pain. Initial encounter.  EXAM: TRANSABDOMINAL AND TRANSVAGINAL ULTRASOUND OF PELVIS  TECHNIQUE: Both transabdominal and transvaginal ultrasound examinations of the pelvis were performed. Transabdominal technique was performed for global imaging of the pelvis including uterus, ovaries, adnexal regions, and pelvic cul-de-sac. It was necessary to proceed with endovaginal exam following the transabdominal exam to visualize the uterus and ovaries in greater detail.  COMPARISON: CT of the abdomen and pelvis from 12/08/2014, and pelvic ultrasound performed 08/24/2011  FINDINGS: Uterus  Measurements: 9.4 x 6.4 x 7.1 cm. Several uterine fibroids are seen, the largest of which measures 3.5 x 2.8 x 2.6 cm, adjacent to the endometrial echo complex. Nabothian cysts are seen at the cervix.  Endometrium  Thickness: 0.8 cm. No focal abnormality visualized.  Right ovary  Not visualized.  Left ovary  Not visualized.  Other findings  No abnormal free  fluid.  IMPRESSION: Several fibroids noted within the uterus, the largest of which measures 3.5 cm, adjacent to the endometrial echo complex. Uterus otherwise unremarkable. Ovaries not visualized on this study.   Electronically Signed By: Garald Balding M.D. On: 04/27/2015 03:04       ____________________________________________     INITIAL IMPRESSION / ASSESSMENT AND PLAN / ED COURSE  Pertinent labs & imaging results that were available during my care of the patient were reviewed by me and considered in my medical decision making (see chart  for details).    ____________________________________________   FINAL CLINICAL IMPRESSION(S) / ED DIAGNOSES  Final diagnoses:  Uterine leiomyoma, unspecified location      Gregor Hams, MD 04/27/15 [phone]

## 2015-04-27 NOTE — Discharge Instructions (Signed)
Uterine Fibroids Uterine fibroids are tissue masses (tumors) that can develop in the womb (uterus). They are also called leiomyomas. This type of tumor is not cancerous (benign) and does not spread to other parts of the body outside of the pelvic area, which is between the hip bones. Occasionally, fibroids may develop in the fallopian tubes, in the cervix, or on the support structures (ligaments) that surround the uterus. You can have one or many fibroids. Fibroids can vary in size, weight, and where they grow in the uterus. Some can become quite large. Most fibroids do not require medical treatment. CAUSES A fibroid can develop when a single uterine cell keeps growing (replicating). Most cells in the human body have a control mechanism that keeps them from replicating without control. SIGNS AND SYMPTOMS Symptoms may include:   Heavy bleeding during your period.  Bleeding or spotting between periods.  Pelvic pain and pressure.  Bladder problems, such as needing to urinate more often (urinary frequency) or urgently.  Inability to reproduce offspring (infertility).  Miscarriages. DIAGNOSIS Uterine fibroids are diagnosed through a physical exam. Your health care provider may feel the lumpy tumors during a pelvic exam. Ultrasonography and an MRI may be done to determine the size, location, and number of fibroids. TREATMENT Treatment may include:  Watchful waiting. This involves getting the fibroid checked by your health care provider to see if it grows or shrinks. Follow your health care provider's recommendations for how often to have this checked.  Hormone medicines. These can be taken by mouth or given through an intrauterine device (IUD).  Surgery.  Removing the fibroids (myomectomy) or the uterus (hysterectomy).  Removing blood supply to the fibroids (uterine artery embolization). If fibroids interfere with your fertility and you want to become pregnant, your health care provider  may recommend having the fibroids removed.  HOME CARE INSTRUCTIONS  Keep all follow-up visits as directed by your health care provider. This is important.  Take medicines only as directed by your health care provider.  If you were prescribed a hormone treatment, take the hormone medicines exactly as directed.  Do not take aspirin, because it can cause bleeding.  Ask your health care provider about taking iron pills and increasing the amount of dark green, leafy vegetables in your diet. These actions can help to boost your blood iron levels, which may be affected by heavy menstrual bleeding.  Pay close attention to your period and tell your health care provider about any changes, such as:  Increased blood flow that requires you to use more pads or tampons than usual per month.  A change in the number of days that your period lasts per month.  A change in symptoms that are associated with your period, such as abdominal cramping or back pain. SEEK MEDICAL CARE IF:  You have pelvic pain, back pain, or abdominal cramps that cannot be controlled with medicines.  You have an increase in bleeding between and during periods.  You soak tampons or pads in a half hour or less.  You feel lightheaded, extra tired, or weak. SEEK IMMEDIATE MEDICAL CARE IF:  You faint.  You have a sudden increase in pelvic pain.   This information is not intended to replace advice given to you by your health care provider. Make sure you discuss any questions you have with your health care provider.   Document Released: 03/03/2000 Document Revised: 03/27/2014 Document Reviewed: 09/02/2013 Elsevier Interactive Patient Education 2016 Else[employer]  Dysfunctional Uterine Bleeding Dysfunctional uterine bleeding  is abnormal bleeding from the uterus. Dysfunctional uterine bleeding includes:  A period that comes earlier or later than usual.  A period that is lighter, heavier, or has blood clots.  Bleeding  between periods.  Skipping one or more periods.  Bleeding after sexual intercourse.  Bleeding after menopause. HOME CARE INSTRUCTIONS  Pay attention to any changes in your symptoms. Follow these instructions to help with your condition: Eating  Eat well-balanced meals. Include foods that are high in iron, such as liver, meat, shellfish, green leafy vegetables, and eggs.  If you become constipated:  Drink plenty of water.  Eat fruits and vegetables that are high in water and fiber, such as spinach, carrots, raspberries, apples, and mango. Medicines  Take over-the-counter and prescription medicines only as told by your health care provider.  Do not change medicines without talking with your health care provider.  Aspirin or medicines that contain aspirin may make the bleeding worse. Do not take those medicines:  During the week before your period.  During your period.  If you were prescribed iron pills, take them as told by your health care provider. Iron pills help to replace iron that your body loses because of this condition. Activity  If you need to change your sanitary pad or tampon more than one time every 2 hours:  Lie in bed with your feet raised (elevated).  Place a cold pack on your lower abdomen.  Rest as much as possible until the bleeding stops or slows down.  Do not try to lose weight until the bleeding has stopped and your blood iron level is back to normal. Other Instructions  For two months, write down:  When your period starts.  When your period ends.  When any abnormal bleeding occurs.  What problems you notice.  Keep all follow up visits as told by your health care provider. This is important. SEEK MEDICAL CARE IF:  You get light-headed or weak.  You have nausea and vomiting.  You cannot eat or drink without vomiting.  You feel dizzy or have diarrhea while you are taking medicines.  You are taking birth control pills or hormones, and  you want to change them or stop taking them. SEEK IMMEDIATE MEDICAL CARE IF:  You develop a fever or chills.  You need to change your sanitary pad or tampon more than one time per hour.  Your bleeding becomes heavier, or your flow contains clots more often.  You develop pain in your abdomen.  You lose consciousness.  You develop a rash.   This information is not intended to replace advice given to you by your health care provider. Make sure you discuss any questions you have with your health care provider.   Document Released: 03/03/2000 Document Revised: 11/25/2014 Document Reviewed: 06/01/2014 Elsevier Interactive Patient Education [employer].

## 2015-04-27 NOTE — ED Notes (Signed)
Patient ambulatory to triage with steady gait, without difficulty or distress noted; pt reports vag bleeding x month with lower abd cramping and HA

## 2015-04-27 NOTE — ED Notes (Signed)
Pt discharged to home with family.  PIV d/c'd.  Discharge paperwork reviewed with patient.  Follow-up information reviewed with patient.

## 2015-06-10 DIAGNOSIS — D259 Leiomyoma of uterus, unspecified: Secondary | ICD-10-CM | POA: Insufficient documentation

## 2015-06-10 DIAGNOSIS — Z975 Presence of (intrauterine) contraceptive device: Secondary | ICD-10-CM | POA: Insufficient documentation

## 2015-08-01 ENCOUNTER — Encounter: Payer: Self-pay | Admitting: Emergency Medicine

## 2015-08-01 ENCOUNTER — Emergency Department
Admission: EM | Admit: 2015-08-01 | Discharge: 2015-08-01 | Disposition: A | Discharge status: Home / Self Care | Payer: Medicaid Other | Attending: Emergency Medicine | Admitting: Emergency Medicine

## 2015-08-01 DIAGNOSIS — Y929 Unspecified place or not applicable: Secondary | ICD-10-CM | POA: Insufficient documentation

## 2015-08-01 DIAGNOSIS — I10 Essential (primary) hypertension: Secondary | ICD-10-CM | POA: Insufficient documentation

## 2015-08-01 DIAGNOSIS — Z87891 Personal history of nicotine dependence: Secondary | ICD-10-CM | POA: Insufficient documentation

## 2015-08-01 DIAGNOSIS — L089 Local infection of the skin and subcutaneous tissue, unspecified: Secondary | ICD-10-CM

## 2015-08-01 DIAGNOSIS — Z79899 Other long term (current) drug therapy: Secondary | ICD-10-CM | POA: Insufficient documentation

## 2015-08-01 DIAGNOSIS — S91209A Unspecified open wound of unspecified toe(s) with damage to nail, initial encounter: Secondary | ICD-10-CM

## 2015-08-01 DIAGNOSIS — Y939 Activity, unspecified: Secondary | ICD-10-CM | POA: Insufficient documentation

## 2015-08-01 DIAGNOSIS — W228XXA Striking against or struck by other objects, initial encounter: Secondary | ICD-10-CM | POA: Insufficient documentation

## 2015-08-01 DIAGNOSIS — S91201A Unspecified open wound of right great toe with damage to nail, initial encounter: Secondary | ICD-10-CM | POA: Insufficient documentation

## 2015-08-01 DIAGNOSIS — Y999 Unspecified external cause status: Secondary | ICD-10-CM | POA: Insufficient documentation

## 2015-08-01 DIAGNOSIS — E119 Type 2 diabetes mellitus without complications: Secondary | ICD-10-CM | POA: Insufficient documentation

## 2015-08-01 DIAGNOSIS — Z7984 Long term (current) use of oral hypoglycemic drugs: Secondary | ICD-10-CM | POA: Insufficient documentation

## 2015-08-01 DIAGNOSIS — E785 Hyperlipidemia, unspecified: Secondary | ICD-10-CM | POA: Insufficient documentation

## 2015-08-01 HISTORY — DX: Anxiety disorder, unspecified: F41.9

## 2015-08-01 LAB — GLUCOSE, CAPILLARY: GLUCOSE-CAPILLARY: 354 mg/dL — AB (ref 65–99)

## 2015-08-01 MED ORDER — CEPHALEXIN 500 MG PO CAPS: 500.0000 mg | ORAL_CAPSULE | Freq: Three times a day (TID) | ORAL | Status: DC

## 2015-08-01 MED ORDER — LIDOCAINE HCL (PF) 1 % IJ SOLN
10.0000 mL | Freq: Once | INTRAMUSCULAR | Status: AC
  Administered 2015-08-01: 10 mL
  Filled 2015-08-01: qty 10

## 2015-08-01 NOTE — ED Notes (Signed)
Pt comes into the ED via POV c/o right toe pain.  Patient is diabetic and kicked a chair.  Patient in NAD at this time.  Patient has a split in her big toe and has been cleaning it with peroxide and water.

## 2015-08-01 NOTE — Discharge Instructions (Signed)
Fingernail or Toenail Removal, Care After Refer to this sheet in the next few weeks. These instructions provide you with information about caring for yourself after your procedure. Your health care provider may also give you more specific instructions. Your treatment has been planned according to current medical practices, but problems sometimes occur. Call your health care provider if you have any problems or questions after your procedure. WHAT TO EXPECT AFTER THE PROCEDURE After your procedure, it is common to have:  Redness.  Swelling. HOME CARE INSTRUCTIONS  If you have a splint on your finger:  Wear it as directed by your health care provider. Remove it only as directed by your health care provider.  Loosen the splint if your fingers become numb and tingle, or if they turn cold and blue.  If you were given a surgical shoe, wear it as directed by your health care provider.  Take medicines only as directed by your health care provider.  Elevate your hand or foot as much of the time as possible. This helps with pain and swelling.  If you are recovering from fingernail removal, keep your hand raised above the level of your heart.  If you are recovering from toenail removal, lie on a bed or a couch with your leg propped up on pillows, or sit in a reclining chair with the footrest up.  Follow instructions from your health care provider about bandage (dressing) changes and removal:  Change your dressing 24 hours after your procedure or as directed by your health care provider.  Soak your hand or foot in warm, soapy water for 10-20 minutes or as directed by your health care provider. Do this 3 times per day or as directed by your health care provider. This reduces pain and swelling.  After you soak your hand or foot, apply a clean, dry dressing.  Keep your dressing clean and dry. Change your dressing whenever it gets wet or dirty.  Keep all follow-up visits as directed by your  health care provider. This is important. SEEK MEDICAL CARE IF:  You have increased redness or pain at your nail area.  You have increased fluid, blood, or pus coming from your nail area.  There is a bad smell coming from the dressing.  You have a fever.  Your swelling gets worse, or you have swelling that spreads from your finger to your hand or from your toe to your foot.  You have worsening redness that spreads from your finger to your hand or from your toe up to your foot.  Your finger or toe looks blue or black.   This information is not intended to replace advice given to you by your health care provider. Make sure you discuss any questions you have with your health care provider.   Document Released: 03/27/2014 Document Reviewed: 03/27/2014 Elsevier Interactive Patient Education 2016 [place] with your doctor for your diabetes management. Keep toe clean and dry. Clean daily with mild soap and water. Begin taking antibiotics as directed.  You must take your diabetes medicine every day. You will be given information about a diabetic diet today that you should follow. The longer your blood sugar is out of control you increase your risk for diabetic neuropathy, diabetic foot infections and loss of eye vision. Get an appointment with a doctor this week for continued medication and better control of your diabetes.

## 2015-08-01 NOTE — ED Provider Notes (Signed)
[facility] Emergency Department Provider Note   ____________________________________________  Time seen: Approximately 5:43 PM  I have reviewed the triage vital signs and the nursing notes.   HISTORY  Chief Complaint Toe Pain   HPI Michelle Warner is a 40 y.o. female 0 complaint of right great toe pain. Patient states she kicked a chair 3 days ago and at that time called EMS because it was bleeding. She was not transported to the emergency room at that time. She is continue cleaning it with peroxide and water. Patient states she is a diabetic currently taking metformin but has not checked her blood sugar and at least 5 months. She denies having a current physician but continues to get her metformin.At this time her toe has become swollen and extremely tender to touch. Patient has split part of her toenail. Currently she rates her pain as a 7 out of 10.   Past Medical History  Diagnosis Date  . Diabetes mellitus without complication ([place])   . Hypertension   . Heart murmur   . Hypercholesterolemia   . Appendicitis   . Anxiety     Patient Active Problem List   Diagnosis Date Noted  . Exercise counseling 12/11/2014  . Gravida 1 12/11/2014  . Parity 1 12/11/2014  . Abnormal presence of protein in urine 12/11/2014  . Dietary counseling and surveillance 12/11/2014  . Appendicitis 12/08/2014  . Body mass index of 60 or higher ([place]) 08/20/2014  . Cannot sleep 08/20/2014  . Excessive and frequent menstruation 08/20/2014  . Dysmetabolic syndrome [id number]  . Obstructive sleep apnea of adult 08/20/2014  . Type II diabetes mellitus, uncontrolled ([place]) 08/20/2014  . Extreme obesity ([place]) 11/26/2007  . Allergic rhinitis, seasonal 09/04/2007  . Blood in the urine 08/16/2007  . Routine general medical examination at a health care facility 08/16/2007  . Migraine without aura and responsive to treatment 05/01/2007  . Undiagnosed cardiac murmurs 05/01/2007  .  Allergic rhinitis 10/23/2006  . Gastro-esophageal reflux disease without esophagitis 09/04/2006  . Hypertension goal BP (blood pressure) < 140/90 09/04/2006  . HLD (hyperlipidemia) 09/04/2006  . Anemia, iron deficiency 09/04/2006    Past Surgical History  Procedure Laterality Date  . Tonsillectomy    . Cesarean section    . Laparoscopic appendectomy N/A 12/08/2014    Procedure: APPENDECTOMY LAPAROSCOPIC;  Surgeon: Sherri Rad, MD;  Location: [facility] ORS;  Service: General;  Laterality: N/A;    Current Outpatient Rx  Name  Route  Sig  Dispense  Refill  . albuterol (PROVENTIL) (2.5 MG/3ML) 0.083% nebulizer solution   Nebulization   Take 2.5 mg by nebulization every 6 (six) hours as needed for wheezing or shortness of breath.         . cephALEXin (KEFLEX) 500 MG capsule   Oral   Take 1 capsule (500 mg total) by mouth 3 (three) times daily.   30 capsule   0   . ferrous sulfate 325 (65 FE) MG tablet   Oral   Take 1 tablet (325 mg total) by mouth 2 (two) times daily.   60 tablet   5   . fluticasone (FLONASE) 50 MCG/ACT nasal spray   Each Nare   Place 2 sprays into both nostrils daily.   16 g   2   . glipiZIDE (GLUCOTROL) 10 MG tablet   Oral   Take 1 tablet (10 mg total) by mouth 2 (two) times daily.   60 tablet   1   .  lisinopril-hydrochlorothiazide (PRINZIDE,ZESTORETIC) 20-12.5 MG tablet   Oral   Take 1 tablet by mouth daily.   30 tablet   1   . loratadine (CLARITIN) 10 MG tablet   Oral   Take 10 mg by mouth at bedtime.          . lovastatin (MEVACOR) 20 MG tablet   Oral   Take 1 tablet (20 mg total) by mouth at bedtime.   30 tablet   1   . metFORMIN (GLUCOPHAGE) 500 MG tablet   Oral   Take 1 tablet (500 mg total) by mouth 2 (two) times daily.   60 tablet   1   . norgestimate-ethinyl estradiol (ORTHO-CYCLEN,SPRINTEC,PREVIFEM) 0.25-35 MG-MCG tablet   Oral   Take 1 tablet by mouth daily.   1 Package   4   . omeprazole (PRILOSEC) 40 MG capsule   Oral    Take 1 capsule (40 mg total) by mouth daily.   30 capsule   5   . traZODone (DESYREL) 50 MG tablet   Oral   Take 1 tablet (50 mg total) by mouth at bedtime as needed.   30 tablet   5     Allergies Review of patient's allergies indicates no known allergies.  Family History  Problem Relation Age of Onset  . Hypertension Mother   . Heart disease Mother   . Hypertension Father   . Stroke Father   . Heart disease Father     Social History Social History  Substance Use Topics  . Smoking status: Former Research scientist (life sciences)  . Smokeless tobacco: Former Systems developer    Types: Snuff  . Alcohol Use: No    Review of Systems Constitutional: No fever/chills Cardiovascular: Denies chest pain. Respiratory: Denies shortness of breath. Musculoskeletal: Positive for right great toe pain. Skin: Positive for right great toe erythema and tenderness. Neurological: Negative for headaches, focal weakness or numbness.  10-point ROS otherwise negative.  ____________________________________________   PHYSICAL EXAM:  VITAL SIGNS: ED Triage Vitals  Enc Vitals Group     BP 08/01/15 1722 129/94 mmHg     Pulse Rate 08/01/15 1722 96     Resp 08/01/15 1722 16     Temp 08/01/15 1722 97.9 F (36.6 C)     Temp Source 08/01/15 1722 Oral     SpO2 08/01/15 1722 96 %     Weight 08/01/15 1722 215 lb (97.523 kg)     Height 08/01/15 1722 5\' 2"  (1.575 m)     Head Cir --      Peak Flow --      Pain Score 08/01/15 1722 7     Pain Loc --      Pain Edu? --      Excl. in [place]? --     Constitutional: Alert and oriented. Well appearing and in no acute distress. Eyes: Conjunctivae are normal. PERRL. EOMI. Head: Atraumatic. Nose: No congestion/rhinnorhea. Neck: No stridor.   Cardiovascular: Normal rate, regular rhythm. Grossly normal heart sounds.  Good peripheral circulation. Respiratory: Normal respiratory effort.  No retractions. Lungs CTAB. Musculoskeletal: No lower extremity tenderness nor edema.  No joint  effusions. Neurologic:  Normal speech and language. No gross focal neurologic deficits are appreciated. No gait instability. Skin:  Skin is warm, dry. Right great toe there is some erythema. No drainage is noted. Nail has split on the medial side and is extremely tender to touch. Psychiatric: Mood and affect are normal. Speech and behavior are normal.  ____________________________________________   LABS (all  labs ordered are listed, but only abnormal results are displayed)  Labs Reviewed  GLUCOSE, CAPILLARY - Abnormal; Notable for the following:    Glucose-Capillary 354 (*)    All other components within normal limits  CBG MONITORING, ED   ____________________________________________  EKG  Deferred ____________________________________________  RADIOLOGY  Deferred ____________________________________________   PROCEDURES  Procedure(s) performed:  LACERATION REPAIR Performed by: [name] Authorized by: [name] Consent: Verbal consent obtained. Risks and benefits: risks, benefits and alternatives were discussed Consent given by: patient Patient identity confirmed: provided demographic data Prepped and Draped in normal sterile fashion Wound explored  Laceration Location: Right great toe  Laceration Length:   No Foreign Bodies seen or palpated  Anesthesia: Digit block   Local anesthetic: lidocaine 1 % without epinephrine  Anesthetic total: 7 ml  Amount of cleaning: standard  Medial aspect of the right great toe was removed to the nail bed. Minimal amount bleeding.   Patient tolerance: Patient tolerated the procedure well with no immediate complications.  Critical Care performed: No  ____________________________________________   INITIAL IMPRESSION / ASSESSMENT AND PLAN / ED COURSE  Pertinent labs & imaging results that were available during my care of the patient were reviewed by me and considered in my medical decision making (see chart  for details).  Patient was placed on Keflex 500 mg 3 times a day for 10 days. Patient is instructed to clean great toe daily with mild soap and water and watch for signs of further infection. Blood sugar today in the emergency room was 354. Patient admits that she has not taken her metformin in several days. She also states that 3 hours prior to arrival in the emergency room she ate 2 brownies along with food that she had for a late lunch. She does not check her blood sugars at home and states she does not know how. Kendall RN showed patient on steps involved in checking a finger glucose stick. Patient is also encouraged to get an appointment with her doctor or a doctor in the clinic for recheck of her foot and also for better control of her diabetes. She is also given information about the area's disease processes that could result with her diabetes being out of control. ____________________________________________   FINAL CLINICAL IMPRESSION(S) / ED DIAGNOSES  Final diagnoses:  Toenail avulsion, initial encounter  Local skin infection      NEW MEDICATIONS STARTED DURING THIS VISIT:  New Prescriptions   CEPHALEXIN (KEFLEX) 500 MG CAPSULE    Take 1 capsule (500 mg total) by mouth 3 (three) times daily.     Note:  This document was prepared using Dragon voice recognition software and may include unintentional dictation errors.    [name], PA-C 08/01/15 1909

## 2016-05-08 ENCOUNTER — Emergency Department
Admission: EM | Admit: 2016-05-08 | Discharge: 2016-05-08 | Disposition: A | Discharge status: Home / Self Care | Payer: Medicaid Other | Attending: Emergency Medicine | Admitting: Emergency Medicine

## 2016-05-08 ENCOUNTER — Encounter: Payer: Self-pay | Admitting: Emergency Medicine

## 2016-05-08 DIAGNOSIS — R1012 Left upper quadrant pain: Secondary | ICD-10-CM | POA: Insufficient documentation

## 2016-05-08 DIAGNOSIS — G43809 Other migraine, not intractable, without status migrainosus: Secondary | ICD-10-CM

## 2016-05-08 DIAGNOSIS — Z79899 Other long term (current) drug therapy: Secondary | ICD-10-CM | POA: Insufficient documentation

## 2016-05-08 DIAGNOSIS — Z87891 Personal history of nicotine dependence: Secondary | ICD-10-CM | POA: Insufficient documentation

## 2016-05-08 DIAGNOSIS — I10 Essential (primary) hypertension: Secondary | ICD-10-CM | POA: Insufficient documentation

## 2016-05-08 DIAGNOSIS — Z7984 Long term (current) use of oral hypoglycemic drugs: Secondary | ICD-10-CM | POA: Insufficient documentation

## 2016-05-08 DIAGNOSIS — E119 Type 2 diabetes mellitus without complications: Secondary | ICD-10-CM | POA: Insufficient documentation

## 2016-05-08 HISTORY — DX: Major depressive disorder, single episode, unspecified: F32.9

## 2016-05-08 HISTORY — DX: Depression, unspecified: F32.A

## 2016-05-08 LAB — CBC WITH DIFFERENTIAL/PLATELET
BASOS ABS: 0 10*3/uL (ref 0–0.1)
Basophils Relative: 1 %
EOS ABS: 0.1 10*3/uL (ref 0–0.7)
Eosinophils Relative: 2 %
HEMATOCRIT: 41.1 % (ref 35.0–47.0)
HEMOGLOBIN: 14 g/dL (ref 12.0–16.0)
Lymphocytes Relative: 42 %
Lymphs Abs: 2.7 10*3/uL (ref 1.0–3.6)
MCH: 29.9 pg (ref 26.0–34.0)
MCHC: 33.9 g/dL (ref 32.0–36.0)
MCV: 88.2 fL (ref 80.0–100.0)
Monocytes Absolute: 0.4 10*3/uL (ref 0.2–0.9)
Monocytes Relative: 6 %
NEUTROS ABS: 3.2 10*3/uL (ref 1.4–6.5)
NEUTROS PCT: 49 %
Platelets: 297 10*3/uL (ref 150–440)
RBC: 4.66 MIL/uL (ref 3.80–5.20)
RDW: 12.4 % (ref 11.5–14.5)
WBC: 6.4 10*3/uL (ref 3.6–11.0)

## 2016-05-08 LAB — COMPREHENSIVE METABOLIC PANEL
ALBUMIN: 3.9 g/dL (ref 3.5–5.0)
ALK PHOS: 80 U/L (ref 38–126)
ALT: 15 U/L (ref 14–54)
ANION GAP: 8 (ref 5–15)
AST: 20 U/L (ref 15–41)
BILIRUBIN TOTAL: 0.6 mg/dL (ref 0.3–1.2)
BUN: 13 mg/dL (ref 6–20)
CALCIUM: 8.9 mg/dL (ref 8.9–10.3)
CO2: 25 mmol/L (ref 22–32)
Chloride: 103 mmol/L (ref 101–111)
Creatinine, Ser: 0.53 mg/dL (ref 0.44–1.00)
GFR calc Af Amer: >60 mL/min (ref >60)
GFR calc non Af Amer: >60 mL/min (ref >60)
GLUCOSE: 342 mg/dL — AB (ref 65–99)
Potassium: 3.6 mmol/L (ref 3.5–5.1)
SODIUM: 136 mmol/L (ref 135–145)
TOTAL PROTEIN: 7.3 g/dL (ref 6.5–8.1)

## 2016-05-08 LAB — URINALYSIS, COMPLETE (UACMP) WITH MICROSCOPIC
BILIRUBIN URINE: NEGATIVE
Glucose, UA: >=500 mg/dL — AB
HGB URINE DIPSTICK: NEGATIVE
Ketones, ur: NEGATIVE mg/dL
LEUKOCYTES UA: NEGATIVE
Nitrite: NEGATIVE
PROTEIN: NEGATIVE mg/dL
Specific Gravity, Urine: 1.039 — ABNORMAL HIGH (ref 1.005–1.030)
pH: 5 (ref 5.0–8.0)

## 2016-05-08 LAB — LIPASE, BLOOD: Lipase: 23 U/L (ref 11–51)

## 2016-05-08 LAB — POCT PREGNANCY, URINE: PREG TEST UR: NEGATIVE

## 2016-05-08 MED ORDER — DIPHENHYDRAMINE HCL 50 MG/ML IJ SOLN
12.5000 mg | Freq: Once | INTRAMUSCULAR | Status: AC
  Administered 2016-05-08: 12.5 mg via INTRAVENOUS
  Filled 2016-05-08: qty 1

## 2016-05-08 MED ORDER — SODIUM CHLORIDE 0.9 % IV BOLUS (SEPSIS)
1000.0000 mL | Freq: Once | INTRAVENOUS | Status: AC
  Administered 2016-05-08: 1000 mL via INTRAVENOUS

## 2016-05-08 MED ORDER — PROCHLORPERAZINE MALEATE 10 MG PO TABS: 10.0000 mg | ORAL_TABLET | Freq: Four times a day (QID) | ORAL | 0 refills | Status: DC | PRN

## 2016-05-08 MED ORDER — PROCHLORPERAZINE EDISYLATE 5 MG/ML IJ SOLN
5.0000 mg | Freq: Once | INTRAMUSCULAR | Status: AC
  Administered 2016-05-08: 5 mg via INTRAVENOUS
  Filled 2016-05-08: qty 2

## 2016-05-08 NOTE — Discharge Instructions (Signed)
1. You may take Compazine as needed for headache and nausea. °2. Return to the ER for worsening symptoms, persistent vomiting, difficulty breathing or other concerns. °

## 2016-05-08 NOTE — ED Provider Notes (Signed)
[facility] Emergency Department Provider Note   ____________________________________________   First MD Initiated Contact with Patient 05/08/16 [id number]     (approximate)  I have reviewed the triage vital signs and the nursing notes.   HISTORY  Chief Complaint Headache and Flank Pain    HPI Michelle Warner is a 41 y.o. female who presents to the ED from home with a chief complaint of migraine headache and abdominal pain. Patient reports a history of occasional migraine headaches. Complains of a gradual onset, left frontal headache for the past 2 days which is typical of her migraine headaches. Symptoms are associated with nausea only. Denies associated vision changes, neck pain, vomiting. Also complains of left sided abdominal pain which occasionally radiates to her back and is occasionally worsened with food. Denies fever, chills, chest pain, shortness of breath, dysuria, diarrhea. Denies recent travel trauma. Nothing makes her symptoms better or worse.   Past Medical History:  Diagnosis Date  . Anxiety   . Appendicitis   . Depression   . Diabetes mellitus without complication ([place])   . Heart murmur   . Hypercholesterolemia   . Hypertension     Patient Active Problem List   Diagnosis Date Noted  . Exercise counseling 12/11/2014  . Gravida 1 12/11/2014  . Parity 1 12/11/2014  . Abnormal presence of protein in urine 12/11/2014  . Dietary counseling and surveillance 12/11/2014  . Appendicitis 12/08/2014  . Body mass index of 60 or higher 08/20/2014  . Cannot sleep 08/20/2014  . Excessive and frequent menstruation 08/20/2014  . Dysmetabolic syndrome 123456  . Obstructive sleep apnea of adult 08/20/2014  . Type II diabetes mellitus, uncontrolled ([place]) 08/20/2014  . Extreme obesity ([place]) 11/26/2007  . Allergic rhinitis, seasonal 09/04/2007  . Blood in the urine 08/16/2007  . Routine general medical examination at a health care facility 08/16/2007   . Migraine without aura and responsive to treatment 05/01/2007  . Undiagnosed cardiac murmurs 05/01/2007  . Allergic rhinitis 10/23/2006  . Gastro-esophageal reflux disease without esophagitis 09/04/2006  . Hypertension goal BP (blood pressure) < 140/90 09/04/2006  . HLD (hyperlipidemia) 09/04/2006  . Anemia, iron deficiency 09/04/2006    Past Surgical History:  Procedure Laterality Date  . APPENDECTOMY    . CESAREAN SECTION    . LAPAROSCOPIC APPENDECTOMY N/A 12/08/2014   Procedure: APPENDECTOMY LAPAROSCOPIC;  Surgeon: Sherri Rad, MD;  Location: [facility] ORS;  Service: General;  Laterality: N/A;  . TONSILLECTOMY      Prior to Admission medications   Medication Sig Start Date End Date Taking? Authorizing Provider  albuterol (PROVENTIL) (2.5 MG/3ML) 0.083% nebulizer solution Take 2.5 mg by nebulization every 6 (six) hours as needed for wheezing or shortness of breath.    Historical Provider, MD  cephALEXin (KEFLEX) 500 MG capsule Take 1 capsule (500 mg total) by mouth 3 (three) times daily. 08/01/15   [name], PA-C  ferrous sulfate 325 (65 FE) MG tablet Take 1 tablet (325 mg total) by mouth 2 (two) times daily. 08/20/14   [name], MD  fluticasone (FLONASE) 50 MCG/ACT nasal spray Place 2 sprays into both nostrils daily. 08/20/14   [name], MD  glipiZIDE (GLUCOTROL) 10 MG tablet Take 1 tablet (10 mg total) by mouth 2 (two) times daily. 02/15/15   [name], MD  lisinopril-hydrochlorothiazide (PRINZIDE,ZESTORETIC) 20-12.5 MG tablet Take 1 tablet by mouth daily. 02/15/15   [name], MD  loratadine (CLARITIN) 10 MG tablet Take 10 mg by mouth at bedtime.  Historical Provider, MD  lovastatin (MEVACOR) 20 MG tablet Take 1 tablet (20 mg total) by mouth at bedtime. 02/15/15   [name], MD  metFORMIN (GLUCOPHAGE) 500 MG tablet Take 1 tablet (500 mg total) by mouth 2 (two) times daily. 02/15/15   [name], MD  norgestimate-ethinyl estradiol  (ORTHO-CYCLEN,SPRINTEC,PREVIFEM) 0.25-35 MG-MCG tablet Take 1 tablet by mouth daily. 02/24/15   [name], MD  omeprazole (PRILOSEC) 40 MG capsule Take 1 capsule (40 mg total) by mouth daily. 08/20/14   [name], MD  prochlorperazine (COMPAZINE) 10 MG tablet Take 1 tablet (10 mg total) by mouth every 6 (six) hours as needed for nausea. 05/08/16   [name], MD  traZODone (DESYREL) 50 MG tablet Take 1 tablet (50 mg total) by mouth at bedtime as needed. 01/11/15   [name], MD    Allergies Patient has no known allergies.  Family History  Problem Relation Age of Onset  . Hypertension Mother   . Heart disease Mother   . Hypertension Father   . Stroke Father   . Heart disease Father     Social History Social History  Substance Use Topics  . Smoking status: Former Research scientist (life sciences)  . Smokeless tobacco: Former Systems developer    Types: Snuff  . Alcohol use No    Review of Systems  Constitutional: No fever/chills. Eyes: No visual changes. ENT: No sore throat. Cardiovascular: Denies chest pain. Respiratory: Denies shortness of breath. Gastrointestinal: Positive for abdominal pain and nausea nausea, no vomiting.  No diarrhea.  No constipation. Genitourinary: Negative for dysuria. Musculoskeletal: Negative for back pain. Skin: Negative for rash. Neurological: Positive for headache. Negative for focal weakness or numbness.  10-point ROS otherwise negative.  ____________________________________________   PHYSICAL EXAM:  VITAL SIGNS: ED Triage Vitals  Enc Vitals Group     BP 05/08/16 0114 (!) 154/97     Pulse Rate 05/08/16 0114 85     Resp 05/08/16 0114 18     Temp 05/08/16 0114 98.3 F (36.8 C)     Temp Source 05/08/16 0114 Oral     SpO2 05/08/16 0114 97 %     Weight 05/08/16 0115 200 lb (90.7 kg)     Height 05/08/16 0115 5\' 2"  (1.575 m)     Head Circumference --      Peak Flow --      Pain Score 05/08/16 0115 7     Pain Loc --      Pain Edu? --      Excl. in [place]? --       Constitutional: Alert and oriented. Well appearing and in no acute distress. Eyes: Conjunctivae are normal. PERRL. EOMI. Head: Atraumatic. Nose: No congestion/rhinnorhea. Mouth/Throat: Mucous membranes are moist.  Oropharynx non-erythematous. Neck: No stridor. Supple neck without meningismus.  Cardiovascular: Normal rate, regular rhythm. Grossly normal heart sounds.  Good peripheral circulation. Respiratory: Normal respiratory effort.  No retractions. Lungs CTAB. Gastrointestinal: Soft and mildly tender to palpation upper abdomen without rebound or guarding. No distention. No abdominal bruits. No CVA tenderness. Musculoskeletal: No lower extremity tenderness nor edema.  No joint effusions. Neurologic:  Normal speech and language. No gross focal neurologic deficits are appreciated. No gait instability. Skin:  Skin is warm, dry and intact. No rash noted. Psychiatric: Mood and affect are normal. Speech and behavior are normal.  ____________________________________________   LABS (all labs ordered are listed, but only abnormal results are displayed)  Labs Reviewed  COMPREHENSIVE METABOLIC PANEL - Abnormal; Notable for the following:  Result Value   Glucose, Bld 342 (*)    All other components within normal limits  URINALYSIS, COMPLETE (UACMP) WITH MICROSCOPIC - Abnormal; Notable for the following:    Color, Urine YELLOW (*)    APPearance CLEAR (*)    Specific Gravity, Urine 1.039 (*)    Glucose, UA >=500 (*)    Bacteria, UA RARE (*)    Squamous Epithelial / LPF 0-5 (*)    All other components within normal limits  CBC WITH DIFFERENTIAL/PLATELET  LIPASE, BLOOD  POC URINE PREG, ED  POCT PREGNANCY, URINE   ____________________________________________  EKG  None ____________________________________________  RADIOLOGY  None ____________________________________________   PROCEDURES  Procedure(s) performed: None  Procedures  Critical Care performed:  No  ____________________________________________   INITIAL IMPRESSION / ASSESSMENT AND PLAN / ED COURSE  Pertinent labs & imaging results that were available during my care of the patient were reviewed by me and considered in my medical decision making (see chart for details).  41 year old female who presents with migraine headache and upper abdominal pain. We'll obtain screening lab work including LFTs, lipase. Initiate IV fluid resuscitation, Compazine and Benadryl for headache. Will reassess.  Clinical Course as of May 08 613  Mon May 08, 2016  0609 Patient is much improved. No abdominal pain. Headache almost resolved. Neck remains supple without focal neurological deficit. Will discharge home with Compazine to use as needed and she will follow-up with her PCP this week. Strict return precautions given. Patient verbalizes understanding and agrees with plan of care.  [JS]    Clinical Course User Index [JS] [name], MD     ____________________________________________   FINAL CLINICAL IMPRESSION(S) / ED DIAGNOSES  Final diagnoses:  Other migraine without status migrainosus, not intractable  Left upper quadrant pain      NEW MEDICATIONS STARTED DURING THIS VISIT:  New Prescriptions   PROCHLORPERAZINE (COMPAZINE) 10 MG TABLET    Take 1 tablet (10 mg total) by mouth every 6 (six) hours as needed for nausea.     Note:  This document was prepared using Dragon voice recognition software and may include unintentional dictation errors.    [name], MD 05/08/16 [phone]

## 2016-05-08 NOTE — ED Triage Notes (Signed)
Pt presents to ED with headache for the past 2 days and left lower side pain that radiates around to her back. Pt states she has HTN but has not taken her blood pressure medication for the past year. Pt states she isnt sure if her blood pressure is up or her blood sugar. Sensitivity to light. Pt alert and able to answers without difficulty. Ambulatory with a steady gait. No distress noted.

## 2016-05-28 ENCOUNTER — Emergency Department
Admission: EM | Admit: 2016-05-28 | Discharge: 2016-05-28 | Disposition: A | Discharge status: Home / Self Care | Payer: Self-pay | Attending: Emergency Medicine | Admitting: Emergency Medicine

## 2016-05-28 ENCOUNTER — Emergency Department: Payer: Self-pay

## 2016-05-28 DIAGNOSIS — K209 Esophagitis, unspecified without bleeding: Secondary | ICD-10-CM

## 2016-05-28 DIAGNOSIS — Z79899 Other long term (current) drug therapy: Secondary | ICD-10-CM | POA: Insufficient documentation

## 2016-05-28 DIAGNOSIS — Z7984 Long term (current) use of oral hypoglycemic drugs: Secondary | ICD-10-CM | POA: Insufficient documentation

## 2016-05-28 DIAGNOSIS — R070 Pain in throat: Secondary | ICD-10-CM

## 2016-05-28 DIAGNOSIS — Z87891 Personal history of nicotine dependence: Secondary | ICD-10-CM | POA: Insufficient documentation

## 2016-05-28 DIAGNOSIS — I10 Essential (primary) hypertension: Secondary | ICD-10-CM | POA: Insufficient documentation

## 2016-05-28 DIAGNOSIS — E119 Type 2 diabetes mellitus without complications: Secondary | ICD-10-CM | POA: Insufficient documentation

## 2016-05-28 LAB — BASIC METABOLIC PANEL
Anion gap: 8 (ref 5–15)
BUN: 14 mg/dL (ref 6–20)
CALCIUM: 8.9 mg/dL (ref 8.9–10.3)
CO2: 27 mmol/L (ref 22–32)
CREATININE: 0.53 mg/dL (ref 0.44–1.00)
Chloride: 100 mmol/L — ABNORMAL LOW (ref 101–111)
GFR calc Af Amer: >60 mL/min (ref >60)
GFR calc non Af Amer: >60 mL/min (ref >60)
Glucose, Bld: 340 mg/dL — ABNORMAL HIGH (ref 65–99)
Potassium: 4.2 mmol/L (ref 3.5–5.1)
Sodium: 135 mmol/L (ref 135–145)

## 2016-05-28 LAB — CBC
HEMATOCRIT: 42.4 % (ref 35.0–47.0)
Hemoglobin: 15 g/dL (ref 12.0–16.0)
MCH: 30.9 pg (ref 26.0–34.0)
MCHC: 35.5 g/dL (ref 32.0–36.0)
MCV: 87 fL (ref 80.0–100.0)
PLATELETS: 339 10*3/uL (ref 150–440)
RBC: 4.87 MIL/uL (ref 3.80–5.20)
RDW: 12.2 % (ref 11.5–14.5)
WBC: 8.1 10*3/uL (ref 3.6–11.0)

## 2016-05-28 MED ORDER — GLUCAGON HCL RDNA (DIAGNOSTIC) 1 MG IJ SOLR
INTRAMUSCULAR | Status: AC
  Administered 2016-05-28: 1 mg via INTRAVENOUS
  Filled 2016-05-28: qty 1

## 2016-05-28 MED ORDER — GI COCKTAIL ~~LOC~~
30.0000 mL | Freq: Once | ORAL | Status: AC
  Administered 2016-05-28: 30 mL via ORAL
  Filled 2016-05-28: qty 30

## 2016-05-28 MED ORDER — ONDANSETRON HCL 4 MG/2ML IJ SOLN
4.0000 mg | Freq: Once | INTRAMUSCULAR | Status: AC
  Administered 2016-05-28: 4 mg via INTRAVENOUS
  Filled 2016-05-28: qty 2

## 2016-05-28 MED ORDER — GLUCAGON HCL (RDNA) 1 MG IJ SOLR
1.0000 mg | Freq: Once | INTRAMUSCULAR | Status: AC
  Administered 2016-05-28: 1 mg via INTRAVENOUS

## 2016-05-28 MED ORDER — SUCRALFATE 1 G PO TABS: 1.0000 g | ORAL_TABLET | Freq: Two times a day (BID) | ORAL | 0 refills | Status: DC

## 2016-05-28 NOTE — ED Provider Notes (Signed)
[facility] Emergency Department Provider Note   ____________________________________________   First MD Initiated Contact with Patient 05/28/16 [phone]     (approximate)  I have reviewed the triage vital signs and the nursing notes.   HISTORY  Chief Complaint Foreign Body    HPI Michelle Warner is a 41 y.o. female who comes into the hospital today with some throat pain. The patient went to [employer] and had a biscuit. She reports that there was something in the biscuit when she bit down on it. She thinks it might of been a broom bristle. She reports that when she swallowed she thinks that may have cut her throat. After eating part of the biscuit the patient vomited. She reports thatshe is unable to stop vomiting. She reports that her throat hurts and rates her pain a 9 out of 10 in intensity. She reports that she feels that something is stabbing her throat. She is unable to determine if the pain is continual because her throat hurts so much. She reports that she also feels that her chest hurts a little on the right. She says she is swallowing her saliva okay right now but before she was even having to spit out her saliva. The patient's here today for evaluation.   Past Medical History:  Diagnosis Date  . Anxiety   . Appendicitis   . Depression   . Diabetes mellitus without complication ([place])   . Heart murmur   . Hypercholesterolemia   . Hypertension     Patient Active Problem List   Diagnosis Date Noted  . Exercise counseling 12/11/2014  . Gravida 1 12/11/2014  . Parity 1 12/11/2014  . Abnormal presence of protein in urine 12/11/2014  . Dietary counseling and surveillance 12/11/2014  . Appendicitis 12/08/2014  . Body mass index of 60 or higher 08/20/2014  . Cannot sleep 08/20/2014  . Excessive and frequent menstruation 08/20/2014  . Dysmetabolic syndrome 24/58/0998  . Obstructive sleep apnea of adult 08/20/2014  . Type II diabetes mellitus,  uncontrolled ([place]) 08/20/2014  . Extreme obesity ([place]) 11/26/2007  . Allergic rhinitis, seasonal 09/04/2007  . Blood in the urine 08/16/2007  . Routine general medical examination at a health care facility 08/16/2007  . Migraine without aura and responsive to treatment 05/01/2007  . Undiagnosed cardiac murmurs 05/01/2007  . Allergic rhinitis 10/23/2006  . Gastro-esophageal reflux disease without esophagitis 09/04/2006  . Hypertension goal BP (blood pressure) < 140/90 09/04/2006  . HLD (hyperlipidemia) 09/04/2006  . Anemia, iron deficiency 09/04/2006    Past Surgical History:  Procedure Laterality Date  . APPENDECTOMY    . CESAREAN SECTION    . LAPAROSCOPIC APPENDECTOMY N/A 12/08/2014   Procedure: APPENDECTOMY LAPAROSCOPIC;  Surgeon: Sherri Rad, MD;  Location: [facility] ORS;  Service: General;  Laterality: N/A;  . TONSILLECTOMY      Prior to Admission medications   Medication Sig Start Date End Date Taking? Authorizing Provider  albuterol (PROVENTIL) (2.5 MG/3ML) 0.083% nebulizer solution Take 2.5 mg by nebulization every 6 (six) hours as needed for wheezing or shortness of breath.    Historical Provider, MD  cephALEXin (KEFLEX) 500 MG capsule Take 1 capsule (500 mg total) by mouth 3 (three) times daily. 08/01/15   [name], PA-C  ferrous sulfate 325 (65 FE) MG tablet Take 1 tablet (325 mg total) by mouth 2 (two) times daily. 08/20/14   [name], MD  fluticasone (FLONASE) 50 MCG/ACT nasal spray Place 2 sprays into both nostrils daily. 08/20/14  [name], MD  glipiZIDE (GLUCOTROL) 10 MG tablet Take 1 tablet (10 mg total) by mouth 2 (two) times daily. 02/15/15   [name], MD  lisinopril-hydrochlorothiazide (PRINZIDE,ZESTORETIC) 20-12.5 MG tablet Take 1 tablet by mouth daily. 02/15/15   [name], MD  loratadine (CLARITIN) 10 MG tablet Take 10 mg by mouth at bedtime.     Historical Provider, MD  lovastatin (MEVACOR) 20 MG tablet Take 1 tablet (20 mg total) by mouth  at bedtime. 02/15/15   [name], MD  metFORMIN (GLUCOPHAGE) 500 MG tablet Take 1 tablet (500 mg total) by mouth 2 (two) times daily. 02/15/15   [name], MD  norgestimate-ethinyl estradiol (ORTHO-CYCLEN,SPRINTEC,PREVIFEM) 0.25-35 MG-MCG tablet Take 1 tablet by mouth daily. 02/24/15   [name], MD  omeprazole (PRILOSEC) 40 MG capsule Take 1 capsule (40 mg total) by mouth daily. 08/20/14   [name], MD  prochlorperazine (COMPAZINE) 10 MG tablet Take 1 tablet (10 mg total) by mouth every 6 (six) hours as needed for nausea. 05/08/16   [name], MD  sucralfate (CARAFATE) 1 g tablet Take 1 tablet (1 g total) by mouth 2 (two) times daily. 05/28/16   Loney Hering, MD  traZODone (DESYREL) 50 MG tablet Take 1 tablet (50 mg total) by mouth at bedtime as needed. 01/11/15   [name], MD    Allergies Patient has no known allergies.  Family History  Problem Relation Age of Onset  . Hypertension Mother   . Heart disease Mother   . Hypertension Father   . Stroke Father   . Heart disease Father     Social History Social History  Substance Use Topics  . Smoking status: Former Research scientist (life sciences)  . Smokeless tobacco: Former Systems developer    Types: Snuff  . Alcohol use No    Review of Systems Constitutional: No fever/chills Eyes: No visual changes. ENT: sore throat. Cardiovascular: chest pain. Respiratory: Denies shortness of breath. Gastrointestinal: Nausea and vomiting, No abdominal pain. No diarrhea.  No constipation. Genitourinary: Negative for dysuria. Musculoskeletal: Negative for back pain. Skin: Negative for rash. Neurological: Negative for headaches, focal weakness or numbness.  10-point ROS otherwise negative.  ____________________________________________   PHYSICAL EXAM:  VITAL SIGNS: ED Triage Vitals  Enc Vitals Group     BP 05/28/16 0128 124/86     Pulse Rate 05/28/16 0128 95     Resp 05/28/16 0128 20     Temp 05/28/16 0128 98.3 F (36.8 C)     Temp  Source 05/28/16 0128 Oral     SpO2 05/28/16 0128 96 %     Weight --      Height --      Head Circumference --      Peak Flow --      Pain Score 05/28/16 0129 8     Pain Loc --      Pain Edu? --      Excl. in [place]? --     Constitutional: Alert and oriented. Well appearing and in mild distress. Eyes: Conjunctivae are normal. PERRL. EOMI. Head: Atraumatic. Nose: No congestion/rhinnorhea. Mouth/Throat: Mucous membranes are moist.  Oropharynx mildly erythematous. Cardiovascular: Normal rate, regular rhythm. Grossly normal heart sounds.  Good peripheral circulation. Respiratory: Normal respiratory effort.  No retractions. Lungs CTAB. Gastrointestinal: Soft and nontender. No distention. Positive bowel sounds Musculoskeletal: No lower extremity tenderness nor edema.   Neurologic:  Normal speech and language.  Skin:  Skin is warm, dry and intact.  Psychiatric: Mood and affect are normal.  ____________________________________________   LABS (all labs ordered are listed, but only abnormal results are displayed)  Labs Reviewed  BASIC METABOLIC PANEL - Abnormal; Notable for the following:       Result Value   Chloride 100 (*)    Glucose, Bld 340 (*)    All other components within normal limits  CBC   ____________________________________________  EKG  none ____________________________________________  RADIOLOGY  Soft tissue neck xray ____________________________________________   PROCEDURES  Procedure(s) performed: None  Procedures  Critical Care performed: No  ____________________________________________   INITIAL IMPRESSION / ASSESSMENT AND PLAN / ED COURSE  Pertinent labs & imaging results that were available during my care of the patient were reviewed by me and considered in my medical decision making (see chart for details).  This is a 41 year old female who comes into the hospital today after swallowing something at [facility]. The patient reports that her  throat hurts and she thinks it may have been cut by something. The patient has some mild erythema but is otherwise unremarkable. I will do a soft tissue neck and give the patient glucagon. I will also have her drink some Coca-Cola. I will give her some Zofran and a GI cocktail. The patient be reassessed when she received her medication. I will check some blood work in the event of the patient does need an endoscopy.  Clinical Course as of May 28 640  Sun May 28, 2016  0500 Negative. DG Neck Soft Tissue [AW]    Clinical Course User Index [AW] Loney Hering, MD   The patient was able to drink some soda without difficulty. She also reports that her throat pain is improved. She will be discharged to home to follow up.   ____________________________________________   FINAL CLINICAL IMPRESSION(S) / ED DIAGNOSES  Final diagnoses:  Throat pain  Esophagitis      NEW MEDICATIONS STARTED DURING THIS VISIT:  Discharge Medication List as of 05/28/2016  6:23 AM    START taking these medications   Details  sucralfate (CARAFATE) 1 g tablet Take 1 tablet (1 g total) by mouth 2 (two) times daily., Starting Sun 05/28/2016, Print         Note:  This document was prepared using Dragon voice recognition software and may include unintentional dictation errors.    Loney Hering, MD 05/28/16 [phone]

## 2016-05-28 NOTE — ED Notes (Signed)
Pt given coke per Dr. Dahlia Client request

## 2016-05-28 NOTE — ED Notes (Signed)
Pt tolerated coke w/o difficulty, requests ice water.  Ice water given to pt

## 2016-05-28 NOTE — ED Triage Notes (Signed)
Reports feels like something is stuck in her throat.

## 2016-05-28 NOTE — ED Triage Notes (Signed)
Pt says she was eating a biscuit when she noticed what looks like a brush bristle in the biscuit; pt now feels like she has something stuck in her throat and thinks it's another bristle; can swallow but then feels like she needs to vomit; pt able to speak in full sentences

## 2016-05-28 NOTE — ED Notes (Signed)
Pt report possible bristle in throat from FB in food, pt reports vomiting since.  Pt's throat appears red and irritated

## 2016-07-09 ENCOUNTER — Emergency Department
Admission: EM | Admit: 2016-07-09 | Discharge: 2016-07-09 | Disposition: A | Discharge status: Home / Self Care | Payer: Self-pay | Attending: Emergency Medicine | Admitting: Emergency Medicine

## 2016-07-09 DIAGNOSIS — Z7984 Long term (current) use of oral hypoglycemic drugs: Secondary | ICD-10-CM | POA: Insufficient documentation

## 2016-07-09 DIAGNOSIS — K0889 Other specified disorders of teeth and supporting structures: Secondary | ICD-10-CM

## 2016-07-09 DIAGNOSIS — E119 Type 2 diabetes mellitus without complications: Secondary | ICD-10-CM | POA: Insufficient documentation

## 2016-07-09 DIAGNOSIS — K047 Periapical abscess without sinus: Secondary | ICD-10-CM | POA: Insufficient documentation

## 2016-07-09 DIAGNOSIS — Z87891 Personal history of nicotine dependence: Secondary | ICD-10-CM | POA: Insufficient documentation

## 2016-07-09 DIAGNOSIS — I10 Essential (primary) hypertension: Secondary | ICD-10-CM | POA: Insufficient documentation

## 2016-07-09 DIAGNOSIS — Z79899 Other long term (current) drug therapy: Secondary | ICD-10-CM | POA: Insufficient documentation

## 2016-07-09 MED ORDER — AMOXICILLIN-POT CLAVULANATE 875-125 MG PO TABS: 1.0000 | ORAL_TABLET | Freq: Two times a day (BID) | ORAL | 0 refills | Status: DC

## 2016-07-09 MED ORDER — AMOXICILLIN-POT CLAVULANATE 875-125 MG PO TABS
1.0000 | ORAL_TABLET | Freq: Once | ORAL | Status: AC
  Administered 2016-07-09: 1 via ORAL
  Filled 2016-07-09: qty 1

## 2016-07-09 NOTE — ED Provider Notes (Signed)
[facility] Emergency Department Provider Note   ____________________________________________   I have reviewed the triage vital signs and the nursing notes.   HISTORY  Chief Complaint No chief complaint on file.    HPI Michelle Warner is a 41 y.o. female presents with a 2 day history of left-sided upper gum and tooth pain. Patient denies any trauma to the gum or tooth. Patient denies any shortness of breath or difficulty swallowing. She denies any fever, chills, nausea, vomiting however reports intermittent headache.   Past Medical History:  Diagnosis Date  . Anxiety   . Appendicitis   . Depression   . Diabetes mellitus without complication ([place])   . Heart murmur   . Hypercholesterolemia   . Hypertension     Patient Active Problem List   Diagnosis Date Noted  . Exercise counseling 12/11/2014  . Gravida 1 12/11/2014  . Parity 1 12/11/2014  . Abnormal presence of protein in urine 12/11/2014  . Dietary counseling and surveillance 12/11/2014  . Appendicitis 12/08/2014  . Body mass index of 60 or higher 08/20/2014  . Cannot sleep 08/20/2014  . Excessive and frequent menstruation 08/20/2014  . Dysmetabolic syndrome 99/83/3825  . Obstructive sleep apnea of adult 08/20/2014  . Type II diabetes mellitus, uncontrolled ([place]) 08/20/2014  . Extreme obesity 11/26/2007  . Allergic rhinitis, seasonal 09/04/2007  . Blood in the urine 08/16/2007  . Routine general medical examination at a health care facility 08/16/2007  . Migraine without aura and responsive to treatment 05/01/2007  . Undiagnosed cardiac murmurs 05/01/2007  . Allergic rhinitis 10/23/2006  . Gastro-esophageal reflux disease without esophagitis 09/04/2006  . Hypertension goal BP (blood pressure) < 140/90 09/04/2006  . HLD (hyperlipidemia) 09/04/2006  . Anemia, iron deficiency 09/04/2006    Past Surgical History:  Procedure Laterality Date  . APPENDECTOMY    . CESAREAN SECTION    .  LAPAROSCOPIC APPENDECTOMY N/A 12/08/2014   Procedure: APPENDECTOMY LAPAROSCOPIC;  Surgeon: Sherri Rad, MD;  Location: [facility] ORS;  Service: General;  Laterality: N/A;  . TONSILLECTOMY      Prior to Admission medications   Medication Sig Start Date End Date Taking? Authorizing Provider  albuterol (PROVENTIL) (2.5 MG/3ML) 0.083% nebulizer solution Take 2.5 mg by nebulization every 6 (six) hours as needed for wheezing or shortness of breath.    Historical Provider, MD  amoxicillin-clavulanate (AUGMENTIN) 875-125 MG tablet Take 1 tablet by mouth 2 (two) times daily. 07/09/16   Lakyn Alsteen M Alaura Schippers, PA-C  cephALEXin (KEFLEX) 500 MG capsule Take 1 capsule (500 mg total) by mouth 3 (three) times daily. 08/01/15   [name], PA-C  ferrous sulfate 325 (65 FE) MG tablet Take 1 tablet (325 mg total) by mouth 2 (two) times daily. 08/20/14   [name], MD  fluticasone (FLONASE) 50 MCG/ACT nasal spray Place 2 sprays into both nostrils daily. 08/20/14   [name], MD  glipiZIDE (GLUCOTROL) 10 MG tablet Take 1 tablet (10 mg total) by mouth 2 (two) times daily. 02/15/15   [name], MD  lisinopril-hydrochlorothiazide (PRINZIDE,ZESTORETIC) 20-12.5 MG tablet Take 1 tablet by mouth daily. 02/15/15   [name], MD  loratadine (CLARITIN) 10 MG tablet Take 10 mg by mouth at bedtime.     Historical Provider, MD  lovastatin (MEVACOR) 20 MG tablet Take 1 tablet (20 mg total) by mouth at bedtime. 02/15/15   [name], MD  metFORMIN (GLUCOPHAGE) 500 MG tablet Take 1 tablet (500 mg total) by mouth 2 (two) times daily. 02/15/15  [name], MD  norgestimate-ethinyl estradiol (ORTHO-CYCLEN,SPRINTEC,PREVIFEM) 0.25-35 MG-MCG tablet Take 1 tablet by mouth daily. 02/24/15   [name], MD  omeprazole (PRILOSEC) 40 MG capsule Take 1 capsule (40 mg total) by mouth daily. 08/20/14   [name], MD  prochlorperazine (COMPAZINE) 10 MG tablet Take 1 tablet (10 mg total) by mouth every 6 (six) hours as  needed for nausea. 05/08/16   [name], MD  sucralfate (CARAFATE) 1 g tablet Take 1 tablet (1 g total) by mouth 2 (two) times daily. 05/28/16   Loney Hering, MD  traZODone (DESYREL) 50 MG tablet Take 1 tablet (50 mg total) by mouth at bedtime as needed. 01/11/15   [name], MD    Allergies Patient has no known allergies.  Family History  Problem Relation Age of Onset  . Hypertension Mother   . Heart disease Mother   . Hypertension Father   . Stroke Father   . Heart disease Father     Social History Social History  Substance Use Topics  . Smoking status: Former Research scientist (life sciences)  . Smokeless tobacco: Former Systems developer    Types: Snuff  . Alcohol use No    Review of Systems Constitutional: No fever/chills ENT: No sore throat. Left side gum and tooth pain.  Cardiovascular: Denies chest pain. Respiratory: Denies cough Gastrointestinal: No nausea, no vomiting.   Skin: Negative for rash. Neurological: Positive for headaches  ____________________________________________   PHYSICAL EXAM:  VITAL SIGNS: ED Triage Vitals  Enc Vitals Group     BP 07/09/16 2143 138/85     Pulse Rate 07/09/16 2143 95     Resp 07/09/16 2143 17     Temp 07/09/16 2143 98.4 F (36.9 C)     Temp Source 07/09/16 2143 Oral     SpO2 07/09/16 2143 98 %     Weight 07/09/16 2145 205 lb (93 kg)     Height 07/09/16 2145 5\' 2"  (1.575 m)     Head Circumference --      Peak Flow --      Pain Score 07/09/16 2145 9     Pain Loc --      Pain Edu? --      Excl. in [place]? --     Constitutional: Alert and oriented. Well appearing and in no acute distress. Eyes: Conjunctivae are normal. Head: Atraumatic. Nose: No congestion/rhinnorhea. Mouth/Throat: Mucous membranes are moist. Left upper gum redness, mild swelling with #15 tooth pain Cardiovascular: Normal rate, regular rhythm.  Respiratory: Normal respiratory effort.  No retractions.  Neurologic:  Normal speech and language. No gross focal neurologic deficits  are appreciated.  Skin:  Skin is warm, dry and intact. No rash noted. Psychiatric: Mood and affect are normal. Speech and behavior are normal.  ____________________________________________   LABS (all labs ordered are listed, but only abnormal results are displayed)  Labs Reviewed - No data to display ____________________________________________  EKG none  ____________________________________________  RADIOLOGY none ____________________________________________   PROCEDURES  Procedure(s) performed: no    Critical Care performed: no ____________________________________________   INITIAL IMPRESSION / ASSESSMENT AND PLAN / ED COURSE  Pertinent labs & imaging results that were available during my care of the patient were reviewed by me and considered in my medical decision making (see chart for details).  Patient presents with symptoms consistent with a dental infection. She will be given a prescription for Augmentin to be empirically treated for the infection. Patient instructed to take ibuprofen for pain management. Patient will also be  given a list of dental clinics to follow up with for further care. Patient has been instructed to return to the emergency department if symptoms worsen before she obtains a dental clinic appointment.      ____________________________________________   FINAL CLINICAL IMPRESSION(S) / ED DIAGNOSES  Final diagnoses:  Dental infection  Pain, dental      NEW MEDICATIONS STARTED DURING THIS VISIT:  New Prescriptions   AMOXICILLIN-CLAVULANATE (AUGMENTIN) 875-125 MG TABLET    Take 1 tablet by mouth 2 (two) times daily.     Note:  This document was prepared using Dragon voice recognition software and may include unintentional dictation errors.   [place], PA-C 07/09/16 2255    Earleen Newport, MD 07/09/16 2255

## 2016-07-09 NOTE — Discharge Instructions (Signed)
OPTIONS FOR DENTAL FOLLOW UP CARE ° °[place] Department of Health and Human Services - Local Safety Net Dental Clinics °http://www.ncdhhs.gov/dph/oralhealth/services/safetynetclinics.htm °  °[facility] (336-562-3123) ° °[facility] [place] (919-933-9087) ° °[facility] ([phone] ext [phone]) ° °[facility] ([phone]) ° °[facility] ([phone]) °This clinic caters to the indigent population and is on a lottery system. °Location: °[facility], [address], [place] °Clinic Hours: °Wednesdays from 6pm - 9pm, patients seen by a lottery system. °For dates, call or go to [email or web address].[facility].edu/[facility]/patients/Dental-[facility] °Services: °Cleanings, fillings and simple extractions. °Payment Options: °DENTAL WORK IS FREE OF CHARGE. Bring proof of income or support. °Best way to get seen: °Arrive at 5:15 pm - this is a lottery, NOT first come/first serve, so arriving earlier will not increase your chances of being seen. °  °  °[facility] °[phone] °Select option 1 for emergencies °  °Location: °[facility], [address], [place] °Clinic Hours: °No walk-ins accepted - call the day before to schedule an appointment. °Check in times are 9:30 am and 1:30 pm. °Services: °Simple extractions, temporary fillings, pulpectomy/pulp debridement, uncomplicated abscess drainage. °Payment Options: °PAYMENT IS DUE AT THE TIME OF SERVICE.  Fee is usually $100-200, additional surgical procedures (e.g. abscess drainage) may be extra. °Cash, checks, Visa/MasterCard accepted.  Can file Medicaid if patient is covered for dental - patient should call case worker to check. °No discount for [facility] patients. °Best way to get seen: °MUST call the day before and get onto the schedule. Can usually be seen the next 1-2 days. No walk-ins accepted. °  °  °[facility] °[phone] °   °Location: °[facility], [address], [place] °Clinic Hours: °M, W, Th, F 8am or 1:30pm, Tues 9a or 1:30 - first come/first served. °Services: °Simple extractions, temporary fillings, uncomplicated abscess drainage.  You do not need to be an [place] resident. °Payment Options: °PAYMENT IS DUE AT THE TIME OF SERVICE. °Dental insurance, otherwise sliding scale - bring proof of income or support. °Depending on income and treatment needed, cost is usually $50-200. °Best way to get seen: °Arrive early as it is first come/first served. °  °  °[facility] °[phone] °  °Location: °[address] °Clinic Hours: °Mon-Thu 8a-5p °Services: °Most basic dental services including extractions and fillings. °Payment Options: °PAYMENT IS DUE AT THE TIME OF SERVICE. °Sliding scale, up to 50% off - bring proof if income or support. °Medicaid with dental option accepted. °Best way to get seen: °Call to schedule an appointment, can usually be seen within 2 weeks OR they will try to see walk-ins - show up at 8a or 2p (you may have to wait). °  °  °[facility] °919-245-2435 °[place] RESIDENTS ONLY °  °Location: °[facility], [address], [place], [place] [address] °Clinic Hours: By appointment only. °Monday - Thursday 8am-5pm, Friday 8am-12pm °Services: Cleanings, fillings, extractions. °Payment Options: °PAYMENT IS DUE AT THE TIME OF SERVICE. °Cash, Visa or MasterCard. Sliding scale - $30 minimum per service. °Best way to get seen: °Come in to office, complete packet and make an appointment - need proof of income °or support monies for each household member and proof of [place] residence. °Usually takes about a month to get in. °  °  °[facility] °[phone] °  °Location: °[address]   [place] °Clinic Hours: Walk-in Urgent Care Dental Services are offered Monday-Friday  mornings only. °The numbers of emergencies accepted daily is limited to the number of °providers available. °Maximum 15 - Mondays, Wednesdays & Thursdays °Maximum 10 - Tuesdays & Fridays °Services: °You do not need to be a  [place] resident to be seen for a dental emergency. °Emergencies are defined as pain, swelling, abnormal bleeding, or dental trauma. Walkins will receive x-rays if needed. °NOTE: Dental cleaning is not an emergency. °Payment Options: °PAYMENT IS DUE AT THE TIME OF SERVICE. °Minimum co-pay is $40.00 for uninsured patients. °Minimum co-pay is $3.00 for Medicaid with dental coverage. °Dental Insurance is accepted and must be presented at time of visit. °Medicare does not cover dental. °Forms of payment: Cash, credit card, checks. °Best way to get seen: °If not previously registered with the clinic, walk-in dental registration begins at 7:15 am and is on a first come/first serve basis. °If previously registered with the clinic, call to make an appointment. °  °  °[facility] °[phone] °[place] RESIDENTS ONLY °  °Location: °[address], [place], [place] °Clinic Hours: °Mon-Thu 10a-2p °Services: Extractions only! °Payment Options: °FREE (donations accepted) - bring proof of income or support °Best way to get seen: °Call and schedule an appointment OR come at 8am on the 1st Monday of every month (except for holidays) when it is first come/first served. °  °  °[facility] °[phone] °  °Location: °[address], [place] °Clinic Hours: °Friday mornings °Services, Payment Options, Best way to get seen: °Call for info °

## 2016-07-09 NOTE — ED Triage Notes (Signed)
Patient reports left lower jaw toothache for two days.

## 2016-10-02 ENCOUNTER — Ambulatory Visit: Payer: Medicaid Other | Admitting: Family Medicine

## 2017-01-04 ENCOUNTER — Ambulatory Visit: Payer: Self-pay | Admitting: Adult Health Nurse Practitioner

## 2017-01-04 VITALS — BP 133/88 | HR 92 | Temp 98.1°F | Wt 202.7 lb

## 2017-01-04 DIAGNOSIS — E785 Hyperlipidemia, unspecified: Secondary | ICD-10-CM

## 2017-01-04 DIAGNOSIS — E1165 Type 2 diabetes mellitus with hyperglycemia: Secondary | ICD-10-CM

## 2017-01-04 DIAGNOSIS — E782 Mixed hyperlipidemia: Secondary | ICD-10-CM

## 2017-01-04 DIAGNOSIS — I10 Essential (primary) hypertension: Secondary | ICD-10-CM

## 2017-01-04 LAB — GLUCOSE, POCT (MANUAL RESULT ENTRY): POC Glucose: 367 mg/dl — AB (ref 70–99)

## 2017-01-04 MED ORDER — LISINOPRIL-HYDROCHLOROTHIAZIDE 20-12.5 MG PO TABS: 1.0000 | ORAL_TABLET | Freq: Every day | ORAL | 4 refills | Status: DC

## 2017-01-04 MED ORDER — LOVASTATIN 20 MG PO TABS: 20.0000 mg | ORAL_TABLET | Freq: Every day | ORAL | 4 refills | Status: DC

## 2017-01-04 MED ORDER — ALBUTEROL SULFATE (2.5 MG/3ML) 0.083% IN NEBU: 2.5000 mg | INHALATION_SOLUTION | Freq: Four times a day (QID) | RESPIRATORY_TRACT | 0 refills | Status: DC | PRN

## 2017-01-04 MED ORDER — GLIPIZIDE 10 MG PO TABS: 10.0000 mg | ORAL_TABLET | Freq: Two times a day (BID) | ORAL | 4 refills | Status: DC

## 2017-01-04 MED ORDER — METFORMIN HCL 500 MG PO TABS: 500.0000 mg | ORAL_TABLET | Freq: Two times a day (BID) | ORAL | 4 refills | Status: DC

## 2017-01-04 NOTE — Progress Notes (Signed)
Subjective:    Patient ID: Michelle Warner, female    DOB: [birth year], 41 y.o.   MRN: [id number]  HPI   Pt is establishing care here at [facility]. She reports she is in need of labs.  Pt does not smoke or use substances.   Patient Active Problem List   Diagnosis Date Noted  . Exercise counseling 12/11/2014  . Gravida 1 12/11/2014  . Parity 1 12/11/2014  . Abnormal presence of protein in urine 12/11/2014  . Dietary counseling and surveillance 12/11/2014  . Appendicitis 12/08/2014  . Body mass index of 60 or higher 08/20/2014  . Cannot sleep 08/20/2014  . Excessive and frequent menstruation 08/20/2014  . Dysmetabolic syndrome 69/67/8938  . Obstructive sleep apnea of adult 08/20/2014  . Type II diabetes mellitus, uncontrolled ([place]) 08/20/2014  . Extreme obesity 11/26/2007  . Allergic rhinitis, seasonal 09/04/2007  . Blood in the urine 08/16/2007  . Routine general medical examination at a health care facility 08/16/2007  . Migraine without aura and responsive to treatment 05/01/2007  . Undiagnosed cardiac murmurs 05/01/2007  . Allergic rhinitis 10/23/2006  . Gastro-esophageal reflux disease without esophagitis 09/04/2006  . Hypertension goal BP (blood pressure) < 140/90 09/04/2006  . HLD (hyperlipidemia) 09/04/2006  . Anemia, iron deficiency 09/04/2006   Allergies as of 01/04/2017   No Known Allergies     Medication List       Accurate as of 01/04/17  7:21 PM. Always use your most recent med list.          albuterol (2.5 MG/3ML) 0.083% nebulizer solution Commonly known as:  PROVENTIL Take 2.5 mg by nebulization every 6 (six) hours as needed for wheezing or shortness of breath.   amoxicillin-clavulanate 875-125 MG tablet Commonly known as:  AUGMENTIN Take 1 tablet by mouth 2 (two) times daily.   cephALEXin 500 MG capsule Commonly known as:  KEFLEX Take 1 capsule (500 mg total) by mouth 3 (three) times daily.   ferrous sulfate 325 (65 FE) MG tablet Take 1 tablet (325  mg total) by mouth 2 (two) times daily.   fluticasone 50 MCG/ACT nasal spray Commonly known as:  FLONASE Place 2 sprays into both nostrils daily.   glipiZIDE 10 MG tablet Commonly known as:  GLUCOTROL Take 1 tablet (10 mg total) by mouth 2 (two) times daily.   lisinopril-hydrochlorothiazide 20-12.5 MG tablet Commonly known as:  PRINZIDE,ZESTORETIC Take 1 tablet by mouth daily.   loratadine 10 MG tablet Commonly known as:  CLARITIN Take 10 mg by mouth at bedtime.   lovastatin 20 MG tablet Commonly known as:  MEVACOR Take 1 tablet (20 mg total) by mouth at bedtime.   metFORMIN 500 MG tablet Commonly known as:  GLUCOPHAGE Take 1 tablet (500 mg total) by mouth 2 (two) times daily.   omeprazole 40 MG capsule Commonly known as:  PRILOSEC Take 1 capsule (40 mg total) by mouth daily.   prochlorperazine 10 MG tablet Commonly known as:  COMPAZINE Take 1 tablet (10 mg total) by mouth every 6 (six) hours as needed for nausea.   sucralfate 1 g tablet Commonly known as:  CARAFATE Take 1 tablet (1 g total) by mouth 2 (two) times daily.   traZODone 50 MG tablet Commonly known as:  DESYREL Take 1 tablet (50 mg total) by mouth at bedtime as needed.        Review of Systems Pt has Type 2 Diabetes and today her CBG was 367. Pt does not check her glucose at  home because she doesn't have a meter.  Pt reports she has been off of her meds for a year.  Pt reports she has depression and is receiving care at [facility].  Pt was prescribed albuterol for episodes of SOB.  Pt reports no SOB in the past 2 weeks. Pt endorses frequent coughing.  Pt has recent weight lost. Pt is mindful of diet and exercise.  Pt has never been on insulin.  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 Will try to control sugars better and then revisit Gabapentin.   Pt reports her periods have become heavier in the past few months. Pt has not had a pap in years.     Objective:   Physical Exam  Constitutional: She is oriented  to person, place, and time. She appears well-developed and well-nourished.  Cardiovascular: Normal rate, regular rhythm, normal heart sounds and intact distal pulses.   Pulmonary/Chest: Effort normal and breath sounds normal.  Abdominal: Soft. Bowel sounds are normal.  Musculoskeletal: She exhibits no edema.  Neurological: She is alert and oriented to person, place, and time.  Skin: Skin is warm and dry.    BP 133/88   Pulse 92   Temp 98.1 F (36.7 C)   Wt 202 lb 11.2 oz (91.9 kg)   BMI 37.07 kg/m        Assessment & Plan:   Pt needs a glucometer.  Referral for pap smear with [facility].  Labs ordered.  Medications refilled.

## 2017-01-05 LAB — TSH: TSH: 1.44 u[IU]/mL (ref 0.450–4.500)

## 2017-01-05 LAB — HEMOGLOBIN A1C
ESTIMATED AVERAGE GLUCOSE: 301 mg/dL
HEMOGLOBIN A1C: 12.1 % — AB (ref 4.8–5.6)

## 2017-01-05 LAB — CBC
Hematocrit: 43.6 % (ref 34.0–46.6)
Hemoglobin: 14.5 g/dL (ref 11.1–15.9)
MCH: 30.2 pg (ref 26.6–33.0)
MCHC: 33.3 g/dL (ref 31.5–35.7)
MCV: 91 fL (ref 79–97)
PLATELETS: 335 10*3/uL (ref 150–379)
RBC: 4.8 x10E6/uL (ref 3.77–5.28)
RDW: 12.2 % — AB (ref 12.3–15.4)
WBC: 6.9 10*3/uL (ref 3.4–10.8)

## 2017-01-05 LAB — COMPREHENSIVE METABOLIC PANEL
A/G RATIO: 1.7 (ref 1.2–2.2)
ALK PHOS: 87 IU/L (ref 39–117)
ALT: 15 IU/L (ref 0–32)
AST: 12 IU/L (ref 0–40)
Albumin: 4.3 g/dL (ref 3.5–5.5)
BUN/Creatinine Ratio: 13 (ref 9–23)
BUN: 9 mg/dL (ref 6–20)
Bilirubin Total: 0.3 mg/dL (ref 0.0–1.2)
CALCIUM: 9.2 mg/dL (ref 8.7–10.2)
CO2: 24 mmol/L (ref 20–29)
Chloride: 96 mmol/L (ref 96–106)
Creatinine, Ser: 0.67 mg/dL (ref 0.57–1.00)
GFR calc Af Amer: 128 mL/min/{1.73_m2} (ref >59)
GFR, EST NON AFRICAN AMERICAN: 111 mL/min/{1.73_m2} (ref >59)
Globulin, Total: 2.5 g/dL (ref 1.5–4.5)
Glucose: 387 mg/dL — ABNORMAL HIGH (ref 65–99)
POTASSIUM: 4.6 mmol/L (ref 3.5–5.2)
Sodium: 136 mmol/L (ref 134–144)
Total Protein: 6.8 g/dL (ref 6.0–8.5)

## 2017-01-05 LAB — LIPID PANEL
Chol/HDL Ratio: 5.1 ratio — ABNORMAL HIGH (ref 0.0–4.4)
Cholesterol, Total: 240 mg/dL — ABNORMAL HIGH (ref 100–199)
HDL: 47 mg/dL (ref >39)
LDL Calculated: 122 mg/dL — ABNORMAL HIGH (ref 0–99)
Triglycerides: 355 mg/dL — ABNORMAL HIGH (ref 0–149)
VLDL Cholesterol Cal: 71 mg/dL — ABNORMAL HIGH (ref 5–40)

## 2017-01-09 ENCOUNTER — Other Ambulatory Visit: Payer: Self-pay | Admitting: Adult Health Nurse Practitioner

## 2017-01-09 ENCOUNTER — Ambulatory Visit: Payer: Self-pay

## 2017-01-09 MED ORDER — METFORMIN HCL 1000 MG PO TABS: 1000.0000 mg | ORAL_TABLET | Freq: Two times a day (BID) | ORAL | 4 refills | Status: DC

## 2017-01-09 MED ORDER — SIMVASTATIN 10 MG PO TABS: 10.0000 mg | ORAL_TABLET | Freq: Every day | ORAL | 4 refills | Status: DC

## 2017-01-18 ENCOUNTER — Telehealth: Payer: Self-pay

## 2017-01-18 NOTE — Telephone Encounter (Signed)
Called pt and gave results. Pt verbalized understanding.

## 2017-01-18 NOTE — Telephone Encounter (Signed)
-----   Message from Staci Acosta, NP sent at 01/09/2017  7:42 PM EDT ----- A1c is 12.1 and LDL is 122. Make sure patient has picked up and restarted her medications. Increase Metformin to 1000mg  BID. Rx sent. FU in January . Make sure to monitor diet and increase exercise. Other labs stable.

## 2017-01-30 ENCOUNTER — Ambulatory Visit: Payer: Self-pay | Admitting: Pharmacy Technician

## 2017-01-30 NOTE — Progress Notes (Signed)
Patient scheduled for eligibility appointment at [facility].  Patient did not show for the appointment on January 30, 2017 at 2:00p.m.  Patient did not reschedule eligibility appointment.  [facility] unable to provide additional medication assistance until eligibility is determined.  [facility]

## 2017-02-12 ENCOUNTER — Ambulatory Visit: Payer: Self-pay | Admitting: Pharmacy Technician

## 2017-02-12 DIAGNOSIS — Z79899 Other long term (current) drug therapy: Secondary | ICD-10-CM

## 2017-02-12 NOTE — Progress Notes (Signed)
Completed [facility] application and contract.  Patient agreed to all terms of the [facility] contract.    Patient approved to receive medication assistance at [facility] through 2018, as long as eligibility criteria continues to be met.    Provided patient with community resource material based on her particular needs.    Referred pt for MTM.  Patient inquiring about [facility] filling the following medications: Ferrous Sulfate, Fluticasone, Loratadine, Omeprazole, Compazine, Carafate & Albuterol Inhaler.  [facility] does not have prescriptions for these medications.  Sent note to [facility].    [place] [facility]

## 2017-02-15 ENCOUNTER — Other Ambulatory Visit: Payer: Self-pay | Admitting: Adult Health Nurse Practitioner

## 2017-02-15 ENCOUNTER — Other Ambulatory Visit: Payer: Self-pay

## 2017-02-15 DIAGNOSIS — K219 Gastro-esophageal reflux disease without esophagitis: Secondary | ICD-10-CM

## 2017-02-15 DIAGNOSIS — J301 Allergic rhinitis due to pollen: Secondary | ICD-10-CM

## 2017-02-15 MED ORDER — FLUTICASONE PROPIONATE 50 MCG/ACT NA SUSP: 2.0000 | Freq: Every day | NASAL | 2 refills | Status: DC

## 2017-02-15 MED ORDER — LORATADINE 10 MG PO TABS: 10.0000 mg | ORAL_TABLET | Freq: Every day | ORAL | 2 refills | Status: DC

## 2017-02-15 MED ORDER — OMEPRAZOLE 40 MG PO CPDR: 40.0000 mg | DELAYED_RELEASE_CAPSULE | Freq: Every day | ORAL | 5 refills | Status: DC

## 2017-03-05 ENCOUNTER — Other Ambulatory Visit: Payer: Self-pay

## 2017-03-05 ENCOUNTER — Ambulatory Visit: Payer: Self-pay

## 2017-03-05 VITALS — HR 78 | Ht 63.0 in | Wt 206.0 lb

## 2017-03-05 DIAGNOSIS — Z79899 Other long term (current) drug therapy: Secondary | ICD-10-CM

## 2017-03-05 NOTE — Progress Notes (Signed)
MEDICATION MANAGEMENT CLINIC NOTE  Michelle Warner is a 41 yo female seen at the medication management clinic by a pharmacist for a review of her medications.   Prior to Admission medications   Medication Sig Start Date End Date Taking? Authorizing Provider  albuterol (PROVENTIL) (2.5 MG/3ML) 0.083% nebulizer solution Take 3 mLs (2.5 mg total) by nebulization every 6 (six) hours as needed for wheezing or shortness of breath. 01/04/17  Yes Doles-Johnson, Teah, NP  cetirizine (ZYRTEC) 10 MG tablet Take 10 mg by mouth daily.   Yes [provider]  ferrous sulfate 325 (65 FE) MG tablet Take 1 tablet (325 mg total) by mouth 2 (two) times daily. Patient taking differently: Take 325 mg by mouth daily.  08/20/14  Yes [name], MD  fluticasone (FLONASE) 50 MCG/ACT nasal spray Place 2 sprays into both nostrils daily. 02/15/17  Yes Doles-Johnson, Teah, NP  glipiZIDE (GLUCOTROL) 10 MG tablet Take 1 tablet (10 mg total) by mouth 2 (two) times daily before a meal. 01/04/17  Yes Doles-Johnson, Teah, NP  lisinopril-hydrochlorothiazide (PRINZIDE,ZESTORETIC) 20-12.5 MG tablet Take 1 tablet by mouth daily. 01/04/17  Yes Doles-Johnson, Teah, NP  metFORMIN (GLUCOPHAGE) 1000 MG tablet Take 1 tablet (1,000 mg total) by mouth 2 (two) times daily with a meal. Patient taking differently: Take 500 mg by mouth 2 (two) times daily with a meal.  01/09/17  Yes Doles-Johnson, Teah, NP  omeprazole (PRILOSEC) 40 MG capsule Take 1 capsule (40 mg total) by mouth daily. 02/15/17  Yes Doles-Johnson, Teah, NP  prochlorperazine (COMPAZINE) 10 MG tablet Take 1 tablet (10 mg total) by mouth every 6 (six) hours as needed for nausea. 05/08/16  Yes [name], MD  simvastatin (ZOCOR) 10 MG tablet Take 1 tablet (10 mg total) by mouth daily. 01/09/17  Yes Doles-Johnson, Teah, NP  traZODone (DESYREL) 50 MG tablet Take 1 tablet (50 mg total) by mouth at bedtime as needed. Patient taking differently: Take 150 mg by mouth at bedtime as  needed.  01/11/15  Yes [name], MD  loratadine (CLARITIN) 10 MG tablet Take 1 tablet (10 mg total) by mouth at bedtime. Patient not taking: Reported on 03/05/2017 02/15/17   Doles-Johnson, Teah, NP  sucralfate (CARAFATE) 1 g tablet Take 1 tablet (1 g total) by mouth 2 (two) times daily. Patient not taking: Reported on 03/05/2017 05/28/16   Loney Hering, MD    Non-Pharmacologic Management  Meals Patient typically eats one meal a day (dinner). For dinner will eat a meat like chicken/pork chops, a vegetable such as green beans/peas/carrots, every once in a while eats potatoes. She also likes fruit but typically eats canned instead of fresh fruit. Eats out at [place] (brother works there).Sometimes also has lunch such as a sandwich. Will also snack throughout the day with peanut butter crackers. Drinks a lot of water. Also drinks a lot of soda and is starting to drink diet sodas. I encouraged her to eat less processed foods and more fruits and vegetables.   Exercise Patient states that she does a lot of walking throughout the day but does not otherwise do exercise. I encouraged her to continue walking and to work up to doing so for longer periods of time. She stated that she is very interested in losing weight.   Health Maintenance/Date Completed  Last ED visit: Few months ago Last Visit to PCP: Goes to [facility], last visit was a couple months ago  Next Visit to PCP: Believes that she has appointment in January or  February Specialist Visit: [facility], goes every 3 months  Dental Exam: Years -  Needs to see dentist Eye Exam: 1-2 yrs since last saw opthamologist  Pelvic/PAP Exam: Years ago Mammogram: Years ago DEXA: States she has never had one but that she "needs one" Colonoscopy: Years ago Flu Vaccine: No Pneumonia Vaccine: Never had one    Assessment/Plan:  Depression/Anxiety: citalopram. Still has issues with anxiety and depression and doesn't feel that her medications are working  during the day time. She states that she takes citalopram at night because it makes her feel drowsy. I encouraged her to talk with her provider at [facility] about this to see if they would switch her to a different agent. She does not recall which provider she sees.  Sleep: Trazodone. She states that she sleeps well but that she has increased her dose to 150mg  at night without consulting with her physician. I explained that she should make these adjustments after speaking with her physician because trazodone can be too sedating if taken at high doses.  Diabetes: metformin, glipizide. I spoke with the patient concerning her diabetes. Her most recent A1c was 12.1 in 12/2016. I explained to her what an A1c is and how it is used to track control of her diabetes. Patient expressed understanding. I asked patient about monitoring her BG at home. She stated that she did not do it often because she is scared of needles and it makes her anxious. I expressed the importance of checking it at home and keeping a log book. She stated that the last time it was checked (a few weeks ago) that is was in the 500's. I explained the importance of getting her diabetes under control and how she would likely need to be placed on insulin soon if serious changes were not made. She expressed understanding.  Hypertension: Lisinopril-HCTZ. Patient stated that she does not take her BP at home. She says that it is sometimes really high and other times low. She does not recall any specific BP recordings.  GERD: Omeprazole. Patient states that GERD is well-controlled.  HLD: simvastatin. Patient stated she is not having any issues with simvastatin. Last TG was elevated at 355 and LDL at 122. I again stressed importance of diet and exercise.   Patient is a poor historian and is unsure of what medications she is taking and how often. I encouraged her to see a primary care provider at [facility] to discuss her diabetes management as well as a  provider at [facility] to discuss management of her anxiety. Patient expressed understanding but seems unwilling to change her lifestyle at this time. Will f/u with patient again in 6 months to determine progress of care.   Lendon Ka, PharmD Pharmacy Resident

## 2017-03-15 ENCOUNTER — Ambulatory Visit: Payer: Self-pay | Admitting: Family Medicine

## 2017-03-15 VITALS — BP 143/93 | HR 73 | Wt 205.8 lb

## 2017-03-15 DIAGNOSIS — Z09 Encounter for follow-up examination after completed treatment for conditions other than malignant neoplasm: Secondary | ICD-10-CM

## 2017-03-15 DIAGNOSIS — E119 Type 2 diabetes mellitus without complications: Secondary | ICD-10-CM

## 2017-03-15 DIAGNOSIS — L0232 Furuncle of buttock: Secondary | ICD-10-CM

## 2017-03-15 MED ORDER — AMOXICILLIN-POT CLAVULANATE 875-125 MG PO TABS: 1.0000 | ORAL_TABLET | Freq: Two times a day (BID) | ORAL | 0 refills | Status: DC

## 2017-03-15 NOTE — Progress Notes (Signed)
Patient: Michelle Warner Female    DOB: 03-Jul-1975   41 y.o.   MRN: [id number] Visit Date: 03/15/2017  Today's Provider: Azzie Glatter, FNP   Chief Complaint  Patient presents with  . Recurrent Skin Infections    inner upper thigh    Subjective:   HPI  Patient is here for assessment of 'boil' in hip area.   No Known Allergies  Allergies as of 03/15/2017   No Known Allergies     Medication List        Accurate as of 03/15/17  9:07 PM. Always use your most recent med list.          albuterol (2.5 MG/3ML) 0.083% nebulizer solution Commonly known as:  PROVENTIL Take 3 mLs (2.5 mg total) by nebulization every 6 (six) hours as needed for wheezing or shortness of breath.   amoxicillin-clavulanate 875-125 MG tablet Commonly known as:  AUGMENTIN Take 1 tablet by mouth 2 (two) times daily.   cetirizine 10 MG tablet Commonly known as:  ZYRTEC Take 10 mg by mouth daily.   ferrous sulfate 325 (65 FE) MG tablet Take 1 tablet (325 mg total) by mouth 2 (two) times daily.   fluticasone 50 MCG/ACT nasal spray Commonly known as:  FLONASE Place 2 sprays into both nostrils daily.   glipiZIDE 10 MG tablet Commonly known as:  GLUCOTROL Take 1 tablet (10 mg total) by mouth 2 (two) times daily before a meal.   lisinopril-hydrochlorothiazide 20-12.5 MG tablet Commonly known as:  PRINZIDE,ZESTORETIC Take 1 tablet by mouth daily.   loratadine 10 MG tablet Commonly known as:  CLARITIN Take 1 tablet (10 mg total) by mouth at bedtime.   metFORMIN 1000 MG tablet Commonly known as:  GLUCOPHAGE Take 1 tablet (1,000 mg total) by mouth 2 (two) times daily with a meal.   omeprazole 40 MG capsule Commonly known as:  PRILOSEC Take 1 capsule (40 mg total) by mouth daily.   prochlorperazine 10 MG tablet Commonly known as:  COMPAZINE Take 1 tablet (10 mg total) by mouth every 6 (six) hours as needed for nausea.   simvastatin 10 MG tablet Commonly known as:  ZOCOR Take 1 tablet (10 mg  total) by mouth daily.   sucralfate 1 g tablet Commonly known as:  CARAFATE Take 1 tablet (1 g total) by mouth 2 (two) times daily.   traZODone 50 MG tablet Commonly known as:  DESYREL Take 1 tablet (50 mg total) by mouth at bedtime as needed.       Review of Systems  Social History   Tobacco Use  . Smoking status: Former Smoker    Years: 10.00    Types: Cigarettes    Last attempt to quit: 1996    Years since quitting: 23.0  . Smokeless tobacco: Former Systems developer    Types: Snuff  Substance Use Topics  . Alcohol use: No   Objective:   BP (!) 143/93 (BP Location: Right Arm, Patient Position: Sitting, Cuff Size: Normal)   Pulse 73   Wt 205 lb 12.8 oz (93.4 kg)   LMP 03/15/2017   BMI 36.46 kg/m   Physical Exam     Assessment & Plan:   1. Boil of buttock Rx for Augment to Pharmacy. Patient to report to ED if symptoms worsen.   2. Diabetes mellitus without complication (HCC) Monitor. - CBC; Future - Comprehensive metabolic panel; Future - Comprehensive metabolic panel - CBC  3. Follow up Follow up in 1 month for Labs/OV.  Azzie Glatter, FNP   [facility]

## 2017-03-16 LAB — CBC
Hematocrit: 39.2 % (ref 34.0–46.6)
Hemoglobin: 13.2 g/dL (ref 11.1–15.9)
MCH: 30.8 pg (ref 26.6–33.0)
MCHC: 33.7 g/dL (ref 31.5–35.7)
MCV: 91 fL (ref 79–97)
Platelets: 413 10*3/uL — ABNORMAL HIGH (ref 150–379)
RBC: 4.29 x10E6/uL (ref 3.77–5.28)
RDW: 11.9 % — ABNORMAL LOW (ref 12.3–15.4)
WBC: 7.6 10*3/uL (ref 3.4–10.8)

## 2017-03-16 LAB — COMPREHENSIVE METABOLIC PANEL
ALT: 15 IU/L (ref 0–32)
AST: 14 IU/L (ref 0–40)
Albumin/Globulin Ratio: 1.4 (ref 1.2–2.2)
Albumin: 4.1 g/dL (ref 3.5–5.5)
Alkaline Phosphatase: 95 IU/L (ref 39–117)
BUN/Creatinine Ratio: 17 (ref 9–23)
BUN: 9 mg/dL (ref 6–20)
Bilirubin Total: <0.2 mg/dL (ref 0.0–1.2)
CO2: 25 mmol/L (ref 20–29)
Calcium: 9.7 mg/dL (ref 8.7–10.2)
Chloride: 101 mmol/L (ref 96–106)
Creatinine, Ser: 0.52 mg/dL — ABNORMAL LOW (ref 0.57–1.00)
GFR calc Af Amer: 139 mL/min/{1.73_m2} (ref >59)
GFR calc non Af Amer: 121 mL/min/{1.73_m2} (ref >59)
Globulin, Total: 3 g/dL (ref 1.5–4.5)
Glucose: 307 mg/dL — ABNORMAL HIGH (ref 65–99)
Potassium: 5.3 mmol/L — ABNORMAL HIGH (ref 3.5–5.2)
Sodium: 139 mmol/L (ref 134–144)
Total Protein: 7.1 g/dL (ref 6.0–8.5)

## 2017-03-30 ENCOUNTER — Telehealth: Payer: Self-pay

## 2017-03-30 DIAGNOSIS — E875 Hyperkalemia: Secondary | ICD-10-CM

## 2017-03-30 NOTE — Telephone Encounter (Signed)
-----   Message from Nori Riis, PA-C sent at 03/29/2017  6:45 PM EST ----- Potassium is a little high.  Let's recheck her BMP in one month.

## 2017-03-30 NOTE — Telephone Encounter (Signed)
Patient notified and lab scheduled.

## 2017-04-12 ENCOUNTER — Ambulatory Visit: Payer: Self-pay | Admitting: Adult Health Nurse Practitioner

## 2017-04-12 ENCOUNTER — Encounter: Payer: Self-pay | Admitting: Adult Health Nurse Practitioner

## 2017-04-12 VITALS — BP 122/86 | HR 73 | Wt 216.4 lb

## 2017-04-12 DIAGNOSIS — K219 Gastro-esophageal reflux disease without esophagitis: Secondary | ICD-10-CM

## 2017-04-12 DIAGNOSIS — G4733 Obstructive sleep apnea (adult) (pediatric): Secondary | ICD-10-CM

## 2017-04-12 DIAGNOSIS — E1165 Type 2 diabetes mellitus with hyperglycemia: Secondary | ICD-10-CM

## 2017-04-12 DIAGNOSIS — E785 Hyperlipidemia, unspecified: Secondary | ICD-10-CM

## 2017-04-12 DIAGNOSIS — I10 Essential (primary) hypertension: Secondary | ICD-10-CM

## 2017-04-12 MED ORDER — INSULIN DETEMIR 100 UNIT/ML ~~LOC~~ SOLN: 10.0000 [IU] | Freq: Every day | SUBCUTANEOUS | 11 refills | Status: DC

## 2017-04-12 MED ORDER — GUAIFENESIN 100 MG/5ML PO SOLN: 5.0000 mL | ORAL | 0 refills | Status: DC | PRN

## 2017-04-12 MED ORDER — ALBUTEROL SULFATE HFA 108 (90 BASE) MCG/ACT IN AERS: 1.0000 | INHALATION_SPRAY | Freq: Four times a day (QID) | RESPIRATORY_TRACT | 3 refills | Status: DC | PRN

## 2017-04-12 NOTE — Progress Notes (Signed)
Patient: Michelle Warner Female    DOB: 08-07-1975   42 y.o.   MRN: [id number] Visit Date: 04/12/2017  Today's Provider: Staci Acosta, NP   Chief Complaint  Patient presents with  . Hyperlipidemia  . Hypertension  . Diabetes   Subjective:    HPI    Pt states that she has had a nagging nighttime cough x 2 months.  Non-productive.  Pt states that she has a diagnosis of OSA.  Has tried cough-syrup OTC with no relief.   DM:  Pt has been taking glipizide, not taking metformin due to stomach upset- she says that she does have the medication at home.   A1c was 12.1 in October.  Pt does not check sugars at home.    HTN/HTN"  Taking medications as directed.   No Known Allergies Previous Medications   ALBUTEROL (PROVENTIL) (2.5 MG/3ML) 0.083% NEBULIZER SOLUTION    Take 3 mLs (2.5 mg total) by nebulization every 6 (six) hours as needed for wheezing or shortness of breath.   CETIRIZINE (ZYRTEC) 10 MG TABLET    Take 10 mg by mouth daily.   FLUTICASONE (FLONASE) 50 MCG/ACT NASAL SPRAY    Place 2 sprays into both nostrils daily.   GLIPIZIDE (GLUCOTROL) 10 MG TABLET    Take 1 tablet (10 mg total) by mouth 2 (two) times daily before a meal.   LISINOPRIL-HYDROCHLOROTHIAZIDE (PRINZIDE,ZESTORETIC) 20-12.5 MG TABLET    Take 1 tablet by mouth daily.   METFORMIN (GLUCOPHAGE) 1000 MG TABLET    Take 1 tablet (1,000 mg total) by mouth 2 (two) times daily with a meal.   OMEPRAZOLE (PRILOSEC) 40 MG CAPSULE    Take 1 capsule (40 mg total) by mouth daily.   PROCHLORPERAZINE (COMPAZINE) 10 MG TABLET    Take 1 tablet (10 mg total) by mouth every 6 (six) hours as needed for nausea.   SIMVASTATIN (ZOCOR) 10 MG TABLET    Take 1 tablet (10 mg total) by mouth daily.   TRAZODONE (DESYREL) 50 MG TABLET    Take 1 tablet (50 mg total) by mouth at bedtime as needed.    Review of Systems  All other systems reviewed and are negative.   Social History   Tobacco Use  . Smoking status: Former Smoker    Years:  10.00    Types: Cigarettes    Last attempt to quit: 1996    Years since quitting: 23.0  . Smokeless tobacco: Former Systems developer    Types: Snuff  Substance Use Topics  . Alcohol use: No   Objective:   BP 122/86   Pulse 73   Wt 216 lb 6.4 oz (98.2 kg)   LMP 03/15/2017   BMI 38.33 kg/m   Physical Exam  Constitutional: She is oriented to person, place, and time. She appears well-developed and well-nourished.  HENT:  Head: Normocephalic and atraumatic.  Neck: Normal range of motion. Neck supple.  Cardiovascular: Normal rate and regular rhythm.  Systolic murmur present  Pulmonary/Chest: Effort normal and breath sounds normal.  Abdominal: Soft. Bowel sounds are normal.  Neurological: She is alert and oriented to person, place, and time.  Skin: Skin is warm and dry.  Vitals reviewed.       Assessment & Plan:         DM:  Not controlled.  Encourage diabetic diet and exercise.  Continue glipizide, stop metformin.  Start Levemir or Lantus (whichever available) 10 units nightly.  Check CBGs qAM-bring log to next OV.   HTN:  Controlled.   Goal BP <140/90.  Continue current medication regimen.  Encourage low salt diet and exercise.   HLD: .  Continue current regimen.  Encourage low cholesterol, low fat diet and exercise.   A1c and lipid panel ordered.  CMET from Dec reviewed.   Robitussin for cough.  Supportive care. Use Albuterol neb at night and PRN inhaler.       Staci Acosta, NP   [facility]

## 2017-04-13 LAB — LIPID PANEL
CHOL/HDL RATIO: 3.8 ratio (ref 0.0–4.4)
Cholesterol, Total: 200 mg/dL — ABNORMAL HIGH (ref 100–199)
HDL: 53 mg/dL (ref >39)
Triglycerides: 477 mg/dL — ABNORMAL HIGH (ref 0–149)

## 2017-04-13 LAB — HEMOGLOBIN A1C
Est. average glucose Bld gHb Est-mCnc: 269 mg/dL
HEMOGLOBIN A1C: 11 % — AB (ref 4.8–5.6)

## 2017-04-16 ENCOUNTER — Telehealth: Payer: Self-pay | Admitting: Pharmacist

## 2017-04-16 NOTE — Telephone Encounter (Signed)
04/16/17 Received a pharmacy printout for Levemir Vials Inject 10 units under the skin daily at bedtime, printed [place] patient her portion to sign & return, sending provider portion to [facility].AJ 04/16/17 Received a pharmacy printout for Ventolin HFA Inhale 1-2 puffs into the lungs every 6 hours as needed for wheezing or shortness of breath, printed [place] application-mailing patient her portion to sign & return, sending provider script to [facility].Delos Haring

## 2017-04-17 ENCOUNTER — Ambulatory Visit: Payer: Medicaid Other | Admitting: Adult Health Nurse Practitioner

## 2017-04-17 DIAGNOSIS — E1142 Type 2 diabetes mellitus with diabetic polyneuropathy: Secondary | ICD-10-CM | POA: Insufficient documentation

## 2017-04-17 MED ORDER — GABAPENTIN 300 MG PO CAPS: 300.0000 mg | ORAL_CAPSULE | Freq: Every day | ORAL | 1 refills | Status: DC

## 2017-04-17 NOTE — Progress Notes (Signed)
Patient: Michelle Warner Female    DOB: 1975-05-21   42 y.o.   MRN: [id number] Visit Date: 04/17/2017  Today's Provider: Staci Acosta, NP   Chief Complaint  Patient presents with  . Toe Pain   Subjective:    HPI  Pt states that her R great toe has been hurting for about 2 months. Unknown injury.  Rates pain 9/10 when she bends it, minimal pain with walking. No pain at rest.  Denies swelling or redness.  Taking tylenol with minimal relief.  Pt also endorse that the entire foot is tingling and stinging.     No Known Allergies Previous Medications   ALBUTEROL (PROVENTIL HFA;VENTOLIN HFA) 108 (90 BASE) MCG/ACT INHALER    Inhale 1-2 puffs into the lungs every 6 (six) hours as needed for wheezing or shortness of breath.   ALBUTEROL (PROVENTIL) (2.5 MG/3ML) 0.083% NEBULIZER SOLUTION    Take 3 mLs (2.5 mg total) by nebulization every 6 (six) hours as needed for wheezing or shortness of breath.   CETIRIZINE (ZYRTEC) 10 MG TABLET    Take 10 mg by mouth daily.   FLUTICASONE (FLONASE) 50 MCG/ACT NASAL SPRAY    Place 2 sprays into both nostrils daily.   GLIPIZIDE (GLUCOTROL) 10 MG TABLET    Take 1 tablet (10 mg total) by mouth 2 (two) times daily before a meal.   GUAIFENESIN (ROBITUSSIN) 100 MG/5ML SOLN    Take 5 mLs (100 mg total) by mouth every 4 (four) hours as needed for cough or to loosen phlegm.   INSULIN DETEMIR (LEVEMIR) 100 UNIT/ML INJECTION    Inject 0.1 mLs (10 Units total) into the skin at bedtime.   LISINOPRIL-HYDROCHLOROTHIAZIDE (PRINZIDE,ZESTORETIC) 20-12.5 MG TABLET    Take 1 tablet by mouth daily.   METFORMIN (GLUCOPHAGE) 1000 MG TABLET    Take 1 tablet (1,000 mg total) by mouth 2 (two) times daily with a meal.   OMEPRAZOLE (PRILOSEC) 40 MG CAPSULE    Take 1 capsule (40 mg total) by mouth daily.   PROCHLORPERAZINE (COMPAZINE) 10 MG TABLET    Take 1 tablet (10 mg total) by mouth every 6 (six) hours as needed for nausea.   SIMVASTATIN (ZOCOR) 10 MG TABLET    Take 1 tablet (10 mg  total) by mouth daily.   TRAZODONE (DESYREL) 50 MG TABLET    Take 1 tablet (50 mg total) by mouth at bedtime as needed.    Review of Systems  All other systems reviewed and are negative.   Social History   Tobacco Use  . Smoking status: Former Smoker    Years: 10.00    Types: Cigarettes    Last attempt to quit: 1996    Years since quitting: 23.0  . Smokeless tobacco: Former Systems developer    Types: Snuff  Substance Use Topics  . Alcohol use: No   Objective:   BP 102/64 (BP Location: Right Arm)   Pulse 78   Temp 97.9 F (36.6 C)   Wt 216 lb 6.4 oz (98.2 kg)   BMI 38.33 kg/m   Physical Exam  Constitutional: She appears well-developed and well-nourished.  Musculoskeletal:       Feet:  Skin: Skin is warm and dry.        Assessment & Plan:        Soak foot in Epsom salts.  Warm compress as needed.  Will start on gabapentin 300mg  QHS for neuropathy.     Staci Acosta, NP   [facility]  [facility]

## 2017-04-19 ENCOUNTER — Other Ambulatory Visit: Payer: Self-pay

## 2017-04-19 DIAGNOSIS — E1140 Type 2 diabetes mellitus with diabetic neuropathy, unspecified: Secondary | ICD-10-CM

## 2017-04-19 MED ORDER — GABAPENTIN 300 MG PO CAPS
300.0000 mg | ORAL_CAPSULE | Freq: Every day | ORAL | 1 refills | Status: DC
Start: 2017-04-19 — End: 2017-06-22

## 2017-04-30 ENCOUNTER — Other Ambulatory Visit: Payer: Self-pay

## 2017-05-07 ENCOUNTER — Encounter: Payer: Self-pay | Admitting: Urology

## 2017-05-07 ENCOUNTER — Other Ambulatory Visit: Payer: Self-pay

## 2017-05-08 ENCOUNTER — Other Ambulatory Visit: Payer: Self-pay

## 2017-05-08 NOTE — Telephone Encounter (Signed)
Received fax requesting refill on cetirizine hcl 10 mg from [facility].

## 2017-05-10 ENCOUNTER — Other Ambulatory Visit: Payer: Self-pay | Admitting: Adult Health Nurse Practitioner

## 2017-05-10 MED ORDER — CETIRIZINE HCL 10 MG PO TABS: 10.0000 mg | ORAL_TABLET | Freq: Every day | ORAL | 0 refills | Status: DC

## 2017-05-15 ENCOUNTER — Ambulatory Visit: Payer: Self-pay

## 2017-05-23 ENCOUNTER — Telehealth: Payer: Self-pay | Admitting: Pharmacy Technician

## 2017-05-23 NOTE — Telephone Encounter (Signed)
Received updated proof of income.  Patient eligible to receive medication assistance at [facility] through 2019, as long as eligibility requirements continue to be met.  [place] [facility]

## 2017-05-29 ENCOUNTER — Ambulatory Visit: Payer: Medicaid Other | Admitting: Family Medicine

## 2017-05-29 VITALS — BP 118/80 | HR 92 | Temp 97.8°F | Wt 223.5 lb

## 2017-05-29 DIAGNOSIS — I10 Essential (primary) hypertension: Secondary | ICD-10-CM

## 2017-05-29 DIAGNOSIS — E1165 Type 2 diabetes mellitus with hyperglycemia: Secondary | ICD-10-CM

## 2017-05-29 DIAGNOSIS — R059 Cough, unspecified: Secondary | ICD-10-CM

## 2017-05-29 DIAGNOSIS — E119 Type 2 diabetes mellitus without complications: Secondary | ICD-10-CM

## 2017-05-29 DIAGNOSIS — E785 Hyperlipidemia, unspecified: Secondary | ICD-10-CM

## 2017-05-29 DIAGNOSIS — R05 Cough: Secondary | ICD-10-CM

## 2017-05-29 LAB — GLUCOSE, POCT (MANUAL RESULT ENTRY): POC Glucose: 237 mg/dL — AB (ref 70–99)

## 2017-05-29 MED ORDER — INSULIN DETEMIR 100 UNIT/ML ~~LOC~~ SOLN: 15.0000 [IU] | Freq: Every day | SUBCUTANEOUS | 2 refills | Status: DC

## 2017-05-29 MED ORDER — SIMVASTATIN 20 MG PO TABS: 20.0000 mg | ORAL_TABLET | Freq: Every day | ORAL | 2 refills | Status: DC

## 2017-05-29 NOTE — Assessment & Plan Note (Signed)
Avoid drinking empty calories; try to walk some before

## 2017-05-29 NOTE — Assessment & Plan Note (Signed)
Continue medicine 

## 2017-05-29 NOTE — Patient Instructions (Addendum)
Increase insulin from ten units daily to fifteen units daily Check sugars and call back in one week with two highest and two lowest readings so we can adjust your insulin Try to cut out sugary drinks Increase the cholesterol medicine from 10 mg to 20 mg; take it at bedtime

## 2017-05-29 NOTE — Assessment & Plan Note (Addendum)
Increase insulin; check FSBS TID and call in one week with two highest and two lowest for dose adjustment; try to cut back sugary beverages; due for A1c on or after April 24th

## 2017-05-29 NOTE — Progress Notes (Signed)
BP 118/80   Pulse 92   Temp 97.8 F (36.6 C)   Wt 223 lb 8 oz (101.4 kg)   BMI 39.59 kg/m    Subjective:    Patient ID: Michelle Warner, female    DOB: [birth year], 42 y.o.   MRN: [id number]  HPI: Michelle Warner is a 42 y.o. female  Chief Complaint  Patient presents with  . Diabetes    F/u, elevated glucose levels    HPI Patient is here for follow-up Elevated sugars at home Testing every day, three times a day Lowest in the last two weeks 100 something Highest in the last two weeks 500, felt bad No recent infections; no steroids Lab Results  Component Value Date   HGBA1C 11.0 (H) 04/12/2017  A1c down from 12.1 in October  She is coughing; going on for months  No flowsheet data found.  Relevant past medical, surgical, family and social history reviewed Past Medical History:  Diagnosis Date  . Anxiety   . Appendicitis   . Depression   . Diabetes mellitus without complication ([place])   . Heart murmur   . Hypercholesterolemia   . Hypertension    Past Surgical History:  Procedure Laterality Date  . APPENDECTOMY    . CESAREAN SECTION    . LAPAROSCOPIC APPENDECTOMY N/A 12/08/2014   Procedure: APPENDECTOMY LAPAROSCOPIC;  Surgeon: Sherri Rad, MD;  Location: [facility] ORS;  Service: General;  Laterality: N/A;  . TONSILLECTOMY     Family History  Problem Relation Age of Onset  . Hypertension Mother   . Heart disease Mother   . Hypertension Father   . Stroke Father   . Heart disease Father    Social History   Tobacco Use  . Smoking status: Former Smoker    Years: 10.00    Types: Cigarettes    Last attempt to quit: 1996    Years since quitting: 23.2  . Smokeless tobacco: Former Systems developer    Types: Snuff  Substance Use Topics  . Alcohol use: No  . Drug use: No    Interim medical history since last visit reviewed. Allergies and medications reviewed  Review of Systems Per HPI unless specifically indicated above     Objective:    BP 118/80   Pulse 92   Temp  97.8 F (36.6 C)   Wt 223 lb 8 oz (101.4 kg)   BMI 39.59 kg/m   Wt Readings from Last 3 Encounters:  05/29/17 223 lb 8 oz (101.4 kg)  04/17/17 216 lb 6.4 oz (98.2 kg)  04/12/17 216 lb 6.4 oz (98.2 kg)    Physical Exam  Constitutional: She appears well-developed and well-nourished.  Weight gain 5 pounds  HENT:  Mouth/Throat: Mucous membranes are normal.  Eyes: EOM are normal. No scleral icterus.  Cardiovascular: Normal rate and regular rhythm.  Pulmonary/Chest: Effort normal and breath sounds normal.  Abdominal: She exhibits no distension.  obese  Musculoskeletal: She exhibits no edema.  Neurological: She is alert.  Skin: No pallor.  Psychiatric: She has a normal mood and affect. Her behavior is normal.   Diabetic Foot Form - Detailed   Diabetic Foot Exam - detailed Diabetic Foot exam was performed with the following findings:  Yes 05/29/2017  6:42 PM  Visual Foot Exam completed.:  Yes  Pulse Foot Exam completed.:  Yes  Right Dorsalis Pedis:  Present Left Dorsalis Pedis:  Present  Sensory Foot Exam Completed.:  Yes Semmes-Weinstein Monofilament Test R Site 1-Great Toe:  Pos  L Site 1-Great Toe:  Pos        Results for orders placed or performed in visit on 05/29/17  POCT Glucose (CBG)  Result Value Ref Range   POC Glucose 237 (A) 70 - 99 mg/dl      Assessment & Plan:   Problem List Items Addressed This Visit      Cardiovascular and Mediastinum   Hypertension goal BP (blood pressure) < 140/90    Continue medicine      Relevant Medications   simvastatin (ZOCOR) 20 MG tablet     Endocrine   Type II diabetes mellitus, uncontrolled (HCC) - Primary    Increase insulin; check FSBS TID and call in one week with two highest and two lowest for dose adjustment; try to cut back sugary beverages; due for A1c on or after April 24th      Relevant Medications   insulin detemir (LEVEMIR) 100 UNIT/ML injection   simvastatin (ZOCOR) 20 MG tablet     Other   Morbid obesity  (HCC)    Avoid drinking empty calories; try to walk some before      Relevant Medications   insulin detemir (LEVEMIR) 100 UNIT/ML injection   HLD (hyperlipidemia)    Increase statin; recheck labs in April      Relevant Medications   simvastatin (ZOCOR) 20 MG tablet    Other Visit Diagnoses    Diabetes mellitus without complication (HCC)       Relevant Medications   insulin detemir (LEVEMIR) 100 UNIT/ML injection   simvastatin (ZOCOR) 20 MG tablet   Other Relevant Orders   POCT Glucose (CBG) (Completed)   Cough       Relevant Orders   DG Chest 2 View       Follow up plan: Return in about 6 weeks (around 07/11/2017) for follow-up visit.  An after-visit summary was printed and given to the patient at [place].  Please see the patient instructions which may contain other information and recommendations beyond what is mentioned above in the assessment and plan.  Meds ordered this encounter  Medications  . insulin detemir (LEVEMIR) 100 UNIT/ML injection    Sig: Inject 0.15 mLs (15 Units total) into the skin at bedtime.    Dispense:  10 mL    Refill:  2  . simvastatin (ZOCOR) 20 MG tablet    Sig: Take 1 tablet (20 mg total) by mouth daily.    Dispense:  30 tablet    Refill:  2    Increasing the dose; cancel other dose    Orders Placed This Encounter  Procedures  . DG Chest 2 View  . POCT Glucose (CBG)

## 2017-05-29 NOTE — Assessment & Plan Note (Signed)
Increase statin; recheck labs in April

## 2017-05-30 ENCOUNTER — Ambulatory Visit
Admission: RE | Admit: 2017-05-30 | Discharge: 2017-05-30 | Disposition: A | Discharge status: Home / Self Care | Payer: Self-pay | Source: Ambulatory Visit | Attending: Family Medicine | Admitting: Family Medicine

## 2017-05-30 DIAGNOSIS — R05 Cough: Secondary | ICD-10-CM | POA: Insufficient documentation

## 2017-05-31 ENCOUNTER — Telehealth: Payer: Self-pay | Admitting: Pharmacist

## 2017-05-31 NOTE — Telephone Encounter (Signed)
05/31/2017 10:22:40 AM - Levemir Vials Dose increase  05/31/17 Received pharmacy printout for Levemir Vials DOSE INCREASE Inject 15 units under the skin daily at bedtime, # 2. I have printed provider portion and taking to [facility] for provider to sign. Patient has not returned her paperwork back to [place].Delos Haring

## 2017-06-22 ENCOUNTER — Other Ambulatory Visit: Payer: Self-pay | Admitting: Adult Health Nurse Practitioner

## 2017-06-22 DIAGNOSIS — I10 Essential (primary) hypertension: Secondary | ICD-10-CM

## 2017-06-22 DIAGNOSIS — E1140 Type 2 diabetes mellitus with diabetic neuropathy, unspecified: Secondary | ICD-10-CM

## 2017-06-29 ENCOUNTER — Telehealth: Payer: Self-pay | Admitting: Pharmacist

## 2017-06-29 NOTE — Telephone Encounter (Signed)
06/29/2017 3:24:35 PM - DENIAL LETTER TO PT FOR VENTOLIN & LEVEMIR  06/29/17 Mailing Denial letter to patient for Ventolin HFA & Levemir, I mailed paperwork to patient to sign and return 04/16/17 and we have not received this back from patient.Delos Haring

## 2017-07-11 ENCOUNTER — Telehealth: Payer: Self-pay | Admitting: Pharmacist

## 2017-07-11 NOTE — Telephone Encounter (Signed)
07/11/2017 10:44:10 AM - Ventolin HFA  07/11/17 Patient has now signed forms needed to order Ventolin HFA 68mcg Inhale 1-2 puffs into the lungs every 6 hours as needed for wheezing or shortness of breath. I have today faxed to [place] for enrollment. The script from provider was dated 04/16/17-I did fax to [place]. Also printed another script and will take to [facility] for provider to sign because I think they will want a more current date on script due to over 66 days old.AJ   07/11/2017 10:41:49 AM - Levemir Vial  07/11/17 Patient has now signed application and forms for Levemir Vials Inject 15 units under the skin daily at bedtime #2; max daily dose 15 units. I have today faxed [employer] application with No Income letter, [phone], and printouts from [place] showing patient has Family Planning only Medicaid. Delos Haring

## 2017-07-12 ENCOUNTER — Ambulatory Visit: Payer: Medicaid Other | Admitting: Adult Health Nurse Practitioner

## 2017-07-12 VITALS — BP 122/84 | HR 93 | Temp 98.5°F | Wt 230.7 lb

## 2017-07-12 DIAGNOSIS — I10 Essential (primary) hypertension: Secondary | ICD-10-CM

## 2017-07-12 DIAGNOSIS — K219 Gastro-esophageal reflux disease without esophagitis: Secondary | ICD-10-CM

## 2017-07-12 DIAGNOSIS — J301 Allergic rhinitis due to pollen: Secondary | ICD-10-CM

## 2017-07-12 DIAGNOSIS — E785 Hyperlipidemia, unspecified: Secondary | ICD-10-CM

## 2017-07-12 DIAGNOSIS — E1165 Type 2 diabetes mellitus with hyperglycemia: Secondary | ICD-10-CM

## 2017-07-12 DIAGNOSIS — E1140 Type 2 diabetes mellitus with diabetic neuropathy, unspecified: Secondary | ICD-10-CM

## 2017-07-12 MED ORDER — GLIPIZIDE 10 MG PO TABS: 10.0000 mg | ORAL_TABLET | Freq: Two times a day (BID) | ORAL | 4 refills | Status: DC

## 2017-07-12 MED ORDER — LISINOPRIL 20 MG PO TABS: 20.0000 mg | ORAL_TABLET | Freq: Every day | ORAL | 1 refills | Status: DC

## 2017-07-12 MED ORDER — FLUTICASONE PROPIONATE 50 MCG/ACT NA SUSP: 2.0000 | Freq: Every day | NASAL | 2 refills | Status: DC

## 2017-07-12 MED ORDER — INSULIN DETEMIR 100 UNIT/ML ~~LOC~~ SOLN: 20.0000 [IU] | Freq: Every day | SUBCUTANEOUS | 2 refills | Status: DC

## 2017-07-12 MED ORDER — GABAPENTIN 300 MG PO CAPS: 300.0000 mg | ORAL_CAPSULE | Freq: Every day | ORAL | 3 refills | Status: DC

## 2017-07-12 MED ORDER — SIMVASTATIN 20 MG PO TABS: 20.0000 mg | ORAL_TABLET | Freq: Every day | ORAL | 2 refills | Status: DC

## 2017-07-12 MED ORDER — CETIRIZINE HCL 10 MG PO TABS: 10.0000 mg | ORAL_TABLET | Freq: Every day | ORAL | 3 refills | Status: DC

## 2017-07-12 MED ORDER — OMEPRAZOLE 40 MG PO CPDR: 40.0000 mg | DELAYED_RELEASE_CAPSULE | Freq: Every day | ORAL | 5 refills | Status: DC

## 2017-07-12 NOTE — Progress Notes (Signed)
Subjective:    Patient ID: Michelle Warner, female    DOB: [birth year], 42 y.o.   MRN: [id number]  HPI  Michelle Warner is a 42 yo female here for f/u from ED visit on 07/02/17 for a fall evaluation from tripping. Negative for fracture. She is also here for 6 week f/u of uncontrolled diabetes. Last visit on 3/12: Dr. Sanda Klein increased insulin to 15 units and instructed pt to check BS 3x/day. Fall: pt reports symptoms have resolved.  Diabetes: Pt self reports BS range from 131-270 fasting in morning. Pt endorses hypoglycemic symptoms when overheated but reports she doesn't feel like her sugar drops. Pt is concerned about her weight.  Diet: pt reports she is not watching her diet.   Mental health: Pt has support from [place] and Peer Support Specialist.  Pt requests an eye exam and dental referral.   Patient Active Problem List   Diagnosis Date Noted  . Diabetic polyneuropathy ([place]) 04/17/2017  . Exercise counseling 12/11/2014  . Gravida 1 12/11/2014  . Parity 1 12/11/2014  . Abnormal presence of protein in urine 12/11/2014  . Dietary counseling and surveillance 12/11/2014  . Appendicitis 12/08/2014  . Body mass index of 60 or higher 08/20/2014  . Cannot sleep 08/20/2014  . Excessive and frequent menstruation 08/20/2014  . Dysmetabolic syndrome 60/73/7106  . Obstructive sleep apnea of adult 08/20/2014  . Type II diabetes mellitus, uncontrolled ([place]) 08/20/2014  . Morbid obesity ([place]) 11/26/2007  . Allergic rhinitis, seasonal 09/04/2007  . Blood in the urine 08/16/2007  . Routine general medical examination at a health care facility 08/16/2007  . Migraine without aura and responsive to treatment 05/01/2007  . Undiagnosed cardiac murmurs 05/01/2007  . Allergic rhinitis 10/23/2006  . Gastro-esophageal reflux disease without esophagitis 09/04/2006  . Hypertension goal BP (blood pressure) < 140/90 09/04/2006  . HLD (hyperlipidemia) 09/04/2006  . Anemia, iron deficiency 09/04/2006    Allergies as of 07/12/2017   No Known Allergies     Medication List        Accurate as of 07/12/17  6:59 PM. Always use your most recent med list.          albuterol 108 (90 Base) MCG/ACT inhaler Commonly known as:  PROVENTIL HFA;VENTOLIN HFA Inhale 1-2 puffs into the lungs every 6 (six) hours as needed for wheezing or shortness of breath.   albuterol (2.5 MG/3ML) 0.083% nebulizer solution Commonly known as:  PROVENTIL INHALE CONTENTS OF ONE VIAL USING NEBULIZER EVERY 6 HOURS AS NEEDED FOR WHEEZING OR SHORTNESS OF BREATH   cetirizine 10 MG tablet Commonly known as:  ZYRTEC TAKE ONE TABLET BY MOUTH EVERY DAY   fluticasone 50 MCG/ACT nasal spray Commonly known as:  FLONASE Place 2 sprays into both nostrils daily.   gabapentin 300 MG capsule Commonly known as:  NEURONTIN TAKE ONE CAPSULE BY MOUTH AT BEDTIME   glipiZIDE 10 MG tablet Commonly known as:  GLUCOTROL Take 1 tablet (10 mg total) by mouth 2 (two) times daily before a meal.   guaiFENesin 100 MG/5ML Soln Commonly known as:  ROBITUSSIN Take 5 mLs (100 mg total) by mouth every 4 (four) hours as needed for cough or to loosen phlegm.   insulin detemir 100 UNIT/ML injection Commonly known as:  LEVEMIR Inject 0.15 mLs (15 Units total) into the skin at bedtime.   lisinopril 20 MG tablet Commonly known as:  PRINIVIL,ZESTRIL TAKE ONE TABLET BY MOUTH EVERY DAY   omeprazole 40 MG capsule Commonly known as:  PRILOSEC Take 1 capsule (40 mg total) by mouth daily.   prochlorperazine 10 MG tablet Commonly known as:  COMPAZINE Take 1 tablet (10 mg total) by mouth every 6 (six) hours as needed for nausea.   simvastatin 20 MG tablet Commonly known as:  ZOCOR Take 1 tablet (20 mg total) by mouth daily.   traZODone 50 MG tablet Commonly known as:  DESYREL Take 1 tablet (50 mg total) by mouth at bedtime as needed.       Review of Systems  All other systems reviewed and are negative.      Objective:   Physical  Exam  Constitutional: She is oriented to person, place, and time. She appears well-developed and well-nourished.  Cardiovascular: Normal rate, regular rhythm, normal heart sounds and intact distal pulses.  Pulmonary/Chest: Effort normal and breath sounds normal.  Abdominal: Soft. Bowel sounds are normal.  Neurological: She is alert and oriented to person, place, and time.  Skin:     Psychiatric:  tearful  Vitals reviewed.   BP 122/84   Pulse 93   Temp 98.5 F (36.9 C)   Wt 230 lb 11.2 oz (104.6 kg)   BMI 40.87 kg/m        Assessment & Plan:   Diabetes: Increase insulin to 20 units.  Continue fasting blood sugar daily and keep Pt log.  Encouraged healthy lifestyle changes- diet and exercise.   HTN:  Controlled.  Goal BP <140/90.  Continue current medication regimen.  Encourage low salt diet and exercise.   HLD:  Continue current regimen.  Encourage low cholesterol, low fat diet and exercise.   Routine labs today.     Refer to [facility] for support. Continue to FU with [facility].  Refer for eye exam and dental clinic

## 2017-07-13 LAB — COMPREHENSIVE METABOLIC PANEL
ALBUMIN: 4.6 g/dL (ref 3.5–5.5)
ALK PHOS: 103 IU/L (ref 39–117)
ALT: 18 IU/L (ref 0–32)
AST: 11 IU/L (ref 0–40)
Albumin/Globulin Ratio: 1.5 (ref 1.2–2.2)
BUN / CREAT RATIO: 16 (ref 9–23)
BUN: 9 mg/dL (ref 6–24)
Bilirubin Total: <0.2 mg/dL (ref 0.0–1.2)
CHLORIDE: 94 mmol/L — AB (ref 96–106)
CO2: 25 mmol/L (ref 20–29)
CREATININE: 0.56 mg/dL — AB (ref 0.57–1.00)
Calcium: 10.1 mg/dL (ref 8.7–10.2)
GFR calc non Af Amer: 117 mL/min/{1.73_m2} (ref >59)
GFR, EST AFRICAN AMERICAN: 135 mL/min/{1.73_m2} (ref >59)
GLOBULIN, TOTAL: 3 g/dL (ref 1.5–4.5)
Glucose: 275 mg/dL — ABNORMAL HIGH (ref 65–99)
Potassium: 4.7 mmol/L (ref 3.5–5.2)
Sodium: 142 mmol/L (ref 134–144)
Total Protein: 7.6 g/dL (ref 6.0–8.5)

## 2017-07-13 LAB — HEMOGLOBIN A1C
Est. average glucose Bld gHb Est-mCnc: 240 mg/dL
Hgb A1c MFr Bld: 10 % — ABNORMAL HIGH (ref 4.8–5.6)

## 2017-07-13 LAB — LDL CHOLESTEROL, DIRECT: LDL DIRECT: 108 mg/dL — AB (ref 0–99)

## 2017-07-18 ENCOUNTER — Telehealth: Payer: Self-pay

## 2017-07-18 ENCOUNTER — Ambulatory Visit: Payer: Medicaid Other | Admitting: Licensed Clinical Social Worker

## 2017-07-18 ENCOUNTER — Ambulatory Visit: Payer: Medicaid Other | Admitting: Ophthalmology

## 2017-07-18 DIAGNOSIS — F411 Generalized anxiety disorder: Secondary | ICD-10-CM

## 2017-07-18 DIAGNOSIS — F331 Major depressive disorder, recurrent, moderate: Secondary | ICD-10-CM

## 2017-07-18 NOTE — Telephone Encounter (Signed)
Spoke with pt while she was at the clinic today. Gave lab results and instructions to increase Levemir to 30 units nightly. Pt understood.

## 2017-07-18 NOTE — Progress Notes (Signed)
Total time: 30 minutes Type of Service: [place]  Interpretor:No.   SUBJECTIVE: BAYLEI SIEBELS is a 42 y.o. female  referred by [place] nurse practicioner in clinic for symptoms of:  depression and low self esteem.. Patient is accompanied by her husband.  Patient reports the following symptoms and or concerns: anxiety, decreased appetite, depression, fatigue, irritability, loss of interest in favorite activities, sleep disturbance and difficulty sleeping.:  Duration of problem:  Ms. Hulick reports that she has been experiencing symptoms of depression for the past several years and her symptoms worsened after her father passed away in 10-19-2022 of last year. She explains that her anxiety also started several years ago but increased after her dad passed.  Impact on function: She explains that her self esteem has been low due to problems with gaining weight and not being able to loose. She notes that her self esteem keeps her from going places and wanting to do things. She explains that her depression and anxiety affect her interest in doing things she loves. She describes isolating herself away from others and wanting to lay in her bed the majority of the time.  Current or Hx of substance use: She denies ever abusing alcohol or other drugs. She denies ever being in treatment for substance abuse.  PSYCHIATRIC HISTORY - Medical conditions that might explain or contribute to symptoms: She has a history of diabetes, hypertension, allergies, sleep apnea, acid reflex, difficulty sleeping, and migraines. She has had a c section, tonsillectomy, appendix removed,and heart murmur in the past.  - Hospitalizations/ Outpatient therapy:  She denies ever being hospitalized for mental illness. She reports that she has been a patient at [facility] for the past year in half for peer support and sees Dr. Leonides Schanz for medications.  -Pharmacotherapy:She reports that she is prescribed two medications for depression and  anxiety: Trazodone 50 mg at bedtime and is unable to remember the name of the other medication. Dr. Leonides [facility] at [facility] prescribed these medications. She reports that these medications are effective.  -Family history of psychiatric issues: She reports that her dad had a history of anxiety and depression. She notes that he was in treatment and on medication prior to him passing in the last year. She believes that her mom has depression but is not in treatment.      LIFE CONTEXT:  [place] lives with husband, mom, daughter, and brother.  She notes that she has the support of her husband and daughter. She notes that she has three dogs and two cats.   Currently employed:No. She is unemployed and in the process of applying for disability.   What is the last grade of school you completed? The highest level of education that Ms. Kondracki completed was part of ninth grade.  Are you active with community agencies/resources/homecare? Yes Agency Name: She receives peer support services at [facility] and is a patient at [facility].  church, synagogue, mosque or other faith based community? Yes  clubs or social organizations? She belongs to [facility] and enjoys going to church every Sunday.  What do you do for fun?  Hobbies?  Interests? She notes that she enjoys playing with her animals, cooking, color, and things like that.  Recent Life changes: She has experienced the loss of her father, health has declined, and low self esteem in the last year.   GOALS ADDRESSED:  Patient will reduce symptoms of: anxiety, depression and low self esteem; increase ability PY:PPJKDT skills,  healthy habits, self-management skills and stress reduction, will also :Increase healthy adjustment to current life circumstances.  INTERVENTIONS:Biopsychosocial assessment, Psychoeducation and/or Health Education, Mindfulness or Relaxation Training , Reflective listening Standardized Assessments completed:  PHQ 9= 12,indication of: moderate depression. GAD-7=16 indication of: severe anxiety.   ISSUES DISCUSSED: Integrated care services, support system, previous and current coping skills, community resources , community support, things patient enjoy or use to enjoy doing, low self esteem, weight gain, health issues, depression, and anxiety.     ASSESSMENT:  Ms. Sellin is a 42 year old Caucasian female who presents today with her husband for a mental health assessment and was referred by [place] nurse practitioner at [facility]. She explains that she has been dealing with both anxiety and depression for the last several years. She explains that her depression worsened in the last year after the passing of her father as evidenced by crying spells, difficulty sleeping, low self esteem, irritability, isolating herself away from others, sadness, loss of interest in previously enjoyed activities, difficulty concentrating, and overeating. She denies suicidal and homicidal thoughts. She denies that there is access to a fire arm in the home. She explains that her anxiety has been present for the last several years but worsened after her weight gain and loosing her father in the last year as evidenced by excessive worrying, irritability, afraid as if something awful might happen, difficulty sleeping, unable to stop or control her worrying, restless, trouble relaxing, and feeling nervous anxious, or on edge. She reports that she has been a patient at [facility] for the last year in half for peer support services and medication management. She explains that Dr. Leonides Schanz prescribes her Trazodone 50 mg at bedtime for insomnia and depression. She notes that she is on another medication for anxiety and depression but is unable to remember the milligrams or dosage. She denies ever being hospitalized for mental illness. She denies ever abusing drugs or alcohol.   She reports that she has a long list of medical problems including type II  diabetes, hypertension, insomnia, weight gain, acid reflex, allergies, and migraines. She has been a patient at [facility] for the last several years. She is unemployed and has applied for disability three times. She explains that she is in the process of appeal for disability. She notes that she didn't graduate high school. She is a member of [employer] and goes to church every Sunday. She notes that her support system consists of her husband and daughter. She notes that there is a history of mental illness in her family; father had anxiety and depression and believes mom has depression.  Patient currently experiencing symptoms of  Depression, anxiety, and low self esteem.  Symptoms exacerbated by health, financial stressors, and weight gain.  Patient may benefit from, and is in agreement to receive further assessment and brief therapeutic interventions to assist with managing symptoms.   PLAN: . Patient will F/U with LCSW Julian Hy . LCSW will F/U with phone call n/a . Behavioral recommendations: Recommendation for Ms. Hickok to follow up with Julian Hy LCSW utilizing cognitive behavioral therapy at least once a week, focusing on symptoms of anxiety, depression, and low self esteem at [facility].  Marland Kitchen Referral:[facility] (In Clinic), recommendation that Ms. Hsu continue to follow up with Dr. Leonides Schanz at [facility] and her peer support specialist.  . :   Warm Hand Off Completed.

## 2017-07-18 NOTE — Telephone Encounter (Signed)
-----   Message from Staci Acosta, NP sent at 07/17/2017  5:56 PM EDT ----- A1c is still elevated at 10, increase Levemir to 30 units nightly. Continue to check blood sugars in the AM before breakfast.

## 2017-07-20 ENCOUNTER — Telehealth: Payer: Self-pay | Admitting: Pharmacist

## 2017-07-20 NOTE — Telephone Encounter (Signed)
07/20/2017 11:07:37 AM - Ventolin HFA  07/20/17 Faxed script to [place] [facility] for Ventolin HFA 90mcg Inhale 1-2 puffs into the lungs every 6 hours as needed for wheezing or shortness of breath.Delos Haring

## 2017-07-24 ENCOUNTER — Ambulatory Visit: Payer: Medicaid Other | Admitting: Licensed Clinical Social Worker

## 2017-07-24 DIAGNOSIS — F4321 Adjustment disorder with depressed mood: Secondary | ICD-10-CM

## 2017-07-24 DIAGNOSIS — F331 Major depressive disorder, recurrent, moderate: Secondary | ICD-10-CM

## 2017-07-24 NOTE — Progress Notes (Signed)
Subjective:  Patient ID: Michelle Warner, female   DOB: [birth year], 42 y.o.   MRN: [id number]    Increase emotional regulation  Michelle Warner  presents with grief. Onset of symptoms was July of 2018 with gradually improving improving. Symptoms have been occurringNot influenced by the time of the day.  Symptoms are currently rated moderate. Associated signs and symptoms include: attention difficulties, sadness and crying spells.Other: Grief and low self esteem.   She notes that her mood has been all over the place. She explains that as the anniversary of her father's death is approaches that she is having a difficult time with it. She notes that overall the month of July will be rough due to her dad's birthday, her birthday, and the anniversary of his death. She notes that she doesn't talk to anyone about the grief. She notes that she talks to her father's ashes that are placed by his bed. She notes that she is spending more time alone in her room by herself. She denies suicidal and homicidal thoughts.   Therapeutic Interventions: Cognitive Behavioral therapy was utilized by the clinician by providing emotional support and encouragement. Clinician encouraged the patient to ventilate about her feelings towards her current situation. Clinician processed with the patient regarding that it's barely been a year since her father passed and its something that she is trying to figure out how to navigate. Clinician provided psycho-education about the five stages of grief: acceptance, denial, depression, anger, and bargaining. Clinician explained that there is no set order for the stages of grief and it will vary person to person. Clinician asked the patient if she is allowing herself to grieve. Clinician encouraged the patient to allow herself to experience the emotions of grief; if she needs to cry, get angry, or vent then that is what she needs to do. Clinician encouraged the patient to not isolate herself in her  bedroom.   Return visit in 1 week.    Effectiveness:  Established problem, stable/improving (1). Progressing It is felt more time is needed for Interventions to work.  . Patient is fully or not fully Other:  orientated to time and place. Patient's Appropriate into problems. Active. Thought process is  Coherent.Minimal: No identifiable suicidal ideation.  Patients presenting with no risk factors but with morbid ruminations; may be classified as minimal risk based on the severity of the depressive symptoms and None.   Homework: Allow herself to grieve and not isolate in her bedroom.   Plan: Follow up with Julian Hy, LCSW at [facility] in one week or earlier.

## 2017-07-27 ENCOUNTER — Other Ambulatory Visit: Payer: Self-pay | Admitting: Adult Health Nurse Practitioner

## 2017-07-31 ENCOUNTER — Ambulatory Visit: Payer: Medicaid Other | Admitting: Licensed Clinical Social Worker

## 2017-07-31 DIAGNOSIS — F411 Generalized anxiety disorder: Secondary | ICD-10-CM

## 2017-07-31 NOTE — Progress Notes (Signed)
Subjective:  Patient ID: Michelle Warner, female   DOB: 1976-01-01, 42 y.o.   MRN: [id number]    Increase emotional regulation  Michelle Warner  presents with anxiety and low self-esteem. Onset of symptoms was last few years with gradually improving improving. Symptoms have been occurringNot influenced by the time of the day.  Symptoms are currently rated moderate. Associated signs and symptoms include: attention difficulties and poor social interactionHealth problems Other: concerned about weight gain.   She reports that she has been doing better this week compared to last week. She explains that she got a lot out of her system during last week's session that made her feel a lot better. She notes that she is still worrying about her weight and overall health. She explains that she wants to work on loosing weight but her energy level is not where it needs to be. She reports that she usually can't fall asleep to 3 am and wakes around 7:30 to 8:00 am when her husband gets off of work. She notes that her allergies have worsened. She notes that typically she is eating fast food. She notes that she will walk up and down the side walk. She denies suicidal and homicidal thoughts.   Therapeutic Interventions: Cognitive Behavioral therapy was utilized by focusing on patient's anxiety and effect on normal cognition. Clinician asked the patient how she has been doing since the last session. Clinician processed with the patient regarding how she has been feeling. Clinician asked the patient what her anxiety is over this week. Clinician asked the patient to give her an idea of a typical day, dietary habits, and sleep habits. Clinician provided psycho education regarding sleep hygiene. Clinician explained that the concept of sleep hygiene is waking up at the same time each day and going to bed at the same time each night. Clinician explained that the fast food she is eating a lot of can also deplete her energy and it's not good  for her overall health. Clinician explained that in order to loose weight that its a combination of healthy diet, monitoring portion sizes, and exercise. Clinician suggested that the patient try to do some walking at least three times a week for fifteen to thirty minutes at a time.   Return visit in 2 weeks.    Effectiveness:  Established problem, stable/improving (1). Progressing It is felt more time is needed for Interventions to work.  . Patient is fully  Other:  orientated to time and place. Patient's Appropriate into problems. Active. Thought process is  Coherent.Minimal: No identifiable suicidal ideation.  Patients presenting with no risk factors but with morbid ruminations; may be classified as minimal risk based on the severity of the depressive symptoms and None.   Homework: Practice sleep hygiene. Come up with a plan for weight loss.   Plan: Follow up with Julian Hy, LCSW at [facility] in two weeks or earlier if needed.

## 2017-08-14 ENCOUNTER — Ambulatory Visit: Payer: Medicaid Other | Admitting: Licensed Clinical Social Worker

## 2017-08-14 DIAGNOSIS — F331 Major depressive disorder, recurrent, moderate: Secondary | ICD-10-CM

## 2017-08-14 NOTE — Progress Notes (Signed)
Subjective:  Patient ID: Michelle Warner, female   DOB: 11/21/1975, 42 y.o.   MRN: [id number]    Increase emotional regulation  Michelle Warner  presents with depression and grief. Onset of symptoms was in the last year in a half with gradually improving improving. Symptoms have been occurringNot influenced by the time of the day.  Symptoms are currently rated moderate. Associated signs and symptoms include: attention difficulties, poor social interaction and sadnessHealth problems Marital or family conflict Other: grief.   She notes that she has been having a difficult time and had a terrible morning. She notes that her dog passed away this morning. She explains that her husband's aunt passed away and they have to travel to [place] on Friday for the funeral. She notes that she also heard about a little boy at her church drowning in the [place] in [place] this past weekend. She notes that her stress level has high is because the air conditioning needs to be fixed in the house and there has been some miscommunication with the maintence man where they live. She denies suicidal and homicidal thoughts.   Therapeutic Interventions: Cognitive Behavioral therapy was utilized by the clinician by providing emotional support and encouragement to the patient. Clinician processed with the patient regarding how she has been doing since the last session. Clinician utilized reflective listening encouraging the patient to share her feelings. Clinician expressed her condolences for the loss of her dog and her husband's aunt. Clinician explained that lots of things happen that are out of our control and it's important to practice self care. Clinician explained that she needs to allow herself to grieve and experience emotions to fully move forward from the losses she has experienced.   Return visit in 2 weeks.    Effectiveness:  Established problem, stable/improving (1). Progressing It is felt more time is needed for  Interventions to work.  . Patient is fully or not fully Other:  orientated to time and place. Patient's Appropriate into problems. Active. Thought process is  Coherent.Minimal: No identifiable suicidal ideation.  Patients presenting with no risk factors but with morbid ruminations; may be classified as minimal risk based on the severity of the depressive symptoms and None.   Homework: Practice self care   Plan: Follow up with Julian Hy, LCSW at [facility] in two weeks or earlier if needed.

## 2017-08-28 ENCOUNTER — Ambulatory Visit: Payer: Medicaid Other | Admitting: Licensed Clinical Social Worker

## 2017-08-28 DIAGNOSIS — F411 Generalized anxiety disorder: Secondary | ICD-10-CM

## 2017-08-28 NOTE — Progress Notes (Signed)
Subjective:  Patient ID: Michelle Warner, female   DOB: 11-05-1975, 42 y.o.   MRN: [id number]    Increase emotional regulation  [name] presents with anxiety. Onset of symptoms was several years with gradually improving improving. Symptoms have been occurringNot influenced by the time of the day.  Symptoms are currently rated moderate. Associated signs and symptoms include: attention difficulties, highly negative, poor social interaction and sadnessFinancial difficulties Health problems Other: grief.   She reports that she has been doing okay. She explains that her anxiety has been rising due to the upcoming anniversary of her dad's death. She notes that she is also stressed out the air conditioning at her house not being fully fixed and the landlord wants to put in a hot water heater. She notes that money is tight and doesn't know if her family can afford another bill. She explains that she has been having this intense pain in her ear and tooth. She asked if it was possible for her to get an appointment in clinic this week to get it checked because she is concerned about having an infection. She notes that she is still having a hard time sleeping and can't help but to take naps during the day.   Therapeutic Interventions: Cognitive Behavioral therapy was utilized by the clinician focusing on her anxiety and affect on normal cognition. Clinician processed with the patient regarding how she has been doing since the last session. Clinician discussed with the patient regarding the factors that are contributing to her anxiety symptoms. Clinician processed with the patient regarding that she can schedule her on Thursday at 6:30 pm in the evening to see a doctor in clinic for the ear and tooth pain. Clinician processed with the patient regarding has she tried to talk to the landlord about the issues with the being able to afford installing a hot water heater that she may not be able to afford. Clinician  suggested that the patient practice self care, take less naps, and be more physically active during the day. Clinician explained that studies show that its helpful to practice sleep hygiene; waking up at the same time each morning and going to bed at the same time each night. Clinician processed with the patient regarding that taking naps during the day will confuse her body and brain's biological clock of when she is supposed to be asleep and awake.  Return visit in 2 weeks.    Effectiveness:  Established problem, stable/improving (1). Progressing It is felt more time is needed for Interventions to work.  . Patient is fully  Other:  orientated to time and place. Patient's Appropriate into problems. Active. Thought process is  Coherent.Minimal: No identifiable suicidal ideation.  Patients presenting with no risk factors but with morbid ruminations; may be classified as minimal risk based on the severity of the depressive symptoms and None.   Homework: Practice self care, be more physically active, and practice sleep hygiene.   Plan: Follow up with Julian Hy, LCSW at [facility] in two weeks or earlier.

## 2017-08-30 ENCOUNTER — Ambulatory Visit: Payer: Medicaid Other | Admitting: Family Medicine

## 2017-08-30 VITALS — BP 115/81 | HR 89 | Temp 98.0°F | Ht 62.0 in | Wt 231.8 lb

## 2017-08-30 DIAGNOSIS — F331 Major depressive disorder, recurrent, moderate: Secondary | ICD-10-CM

## 2017-08-30 DIAGNOSIS — E1165 Type 2 diabetes mellitus with hyperglycemia: Secondary | ICD-10-CM

## 2017-08-30 DIAGNOSIS — E1142 Type 2 diabetes mellitus with diabetic polyneuropathy: Secondary | ICD-10-CM

## 2017-08-30 DIAGNOSIS — Z09 Encounter for follow-up examination after completed treatment for conditions other than malignant neoplasm: Secondary | ICD-10-CM

## 2017-08-30 DIAGNOSIS — R5383 Other fatigue: Secondary | ICD-10-CM

## 2017-08-30 DIAGNOSIS — K047 Periapical abscess without sinus: Secondary | ICD-10-CM

## 2017-08-30 MED ORDER — IBUPROFEN 800 MG PO TABS: 800.0000 mg | ORAL_TABLET | Freq: Three times a day (TID) | ORAL | 2 refills | Status: DC | PRN

## 2017-08-30 MED ORDER — AMOXICILLIN-POT CLAVULANATE 875-125 MG PO TABS: 1.0000 | ORAL_TABLET | Freq: Two times a day (BID) | ORAL | 0 refills | Status: DC

## 2017-08-30 NOTE — Progress Notes (Addendum)
Subjective:    Patient ID: Michelle Warner, female    DOB: 26-Jun-1975, 42 y.o.   MRN: [id number]  PCP: Kathe Becton, NP  Chief Complaint  Patient presents with  . Dental Pain    Right side of jaw, ear and head pain    HPI  Ms. Michelle Warner has history of Hypertension, Hypercholesterolemia, Heart Murmur, Diabetes, Depression, Appendicitis, and Anxiety. He is here for jaw and ear pain .  Current Status: She has lower right tooth pain, in which the pain is radiating to her ears and head. She has multiple dental caries.   Otherwise she is doing well. She reports increased fatigue. She denies fevers, chills, recent infections, and weight loss. She has hot flashes. She has not had any headaches, visual, dizziness, and falls. She has noticed recent vision changes. No chest pain, heart palpitations, and cough and shortness of breath have improved.   No reports of GI problems. She has no reports of blood in stools, dysuria and hematuria.   Her depression and  anxiety are stable, which she follows up with Nira Conn at our office and Dr. Leonides Schanz at [facility].   She has numbness and tingling in her hands and toes.   Past Medical History:  Diagnosis Date  . Anxiety   . Appendicitis   . Depression   . Diabetes mellitus without complication ([place])   . Heart murmur   . Hypercholesterolemia   . Hypertension      Family History  Problem Relation Age of Onset  . Hypertension Mother   . Heart disease Mother   . Hypertension Father   . Stroke Father   . Heart disease Father    Social History   Socioeconomic History  . Marital status: Married    Spouse name: Not on file  . Number of children: Not on file  . Years of education: Not on file  . Highest education level: Not on file  Occupational History  . Not on file  Social Needs  . Financial resource strain: Somewhat hard  . Food insecurity:    Worry: Often true    Inability: Often true  . Transportation needs:    Medical: Yes    Non-medical:  Yes  Tobacco Use  . Smoking status: Former Smoker    Years: 10.00    Types: Cigarettes    Last attempt to quit: 1996    Years since quitting: 23.4  . Smokeless tobacco: Former Systems developer    Types: Snuff  Substance and Sexual Activity  . Alcohol use: No  . Drug use: No  . Sexual activity: Not on file  Lifestyle  . Physical activity:    Days per week: 7 days    Minutes per session: 10 min  . Stress: Very much  Relationships  . Social connections:    Talks on phone: More than three times a week    Gets together: More than three times a week    Attends religious service: More than 4 times per year    Active member of club or organization: Yes    Attends meetings of clubs or organizations: More than 4 times per year    Relationship status: Married  . Intimate partner violence:    Fear of current or ex partner: No    Emotionally abused: No    Physically abused: No    Forced sexual activity: No  Other Topics Concern  . Not on file  Social History Narrative  . Not on file  Past Surgical History:  Procedure Laterality Date  . APPENDECTOMY    . CESAREAN SECTION    . LAPAROSCOPIC APPENDECTOMY N/A 12/08/2014   Procedure: APPENDECTOMY LAPAROSCOPIC;  Surgeon: Sherri Rad, MD;  Location: [facility] ORS;  Service: General;  Laterality: N/A;  . TONSILLECTOMY       There is no immunization history on file for this patient.  Current Meds  Medication Sig  . albuterol (PROVENTIL HFA;VENTOLIN HFA) 108 (90 Base) MCG/ACT inhaler Inhale 1-2 puffs into the lungs every 6 (six) hours as needed for wheezing or shortness of breath.  Marland Kitchen albuterol (PROVENTIL) (2.5 MG/3ML) 0.083% nebulizer solution INHALE CONTENTS OF ONE VIAL USING NEBULIZER EVERY 6 HOURS AS NEEDED FOR WHEEZING OR SHORTNESS OF BREATH  . cetirizine (ZYRTEC) 10 MG tablet Take 1 tablet (10 mg total) by mouth daily.  . fluticasone (FLONASE) 50 MCG/ACT nasal spray Place 2 sprays into both nostrils daily.  Marland Kitchen gabapentin (NEURONTIN) 300 MG capsule  Take 1 capsule (300 mg total) by mouth at bedtime.  Marland Kitchen glipiZIDE (GLUCOTROL) 10 MG tablet Take 1 tablet (10 mg total) by mouth 2 (two) times daily before a meal.  . guaiFENesin (ROBITUSSIN) 100 MG/5ML SOLN Take 5 mLs (100 mg total) by mouth every 4 (four) hours as needed for cough or to loosen phlegm.  . hydrochlorothiazide (MICROZIDE) 12.5 MG capsule TAKE ONE TABLET BY MOUTH EVERY DAY  . insulin detemir (LEVEMIR) 100 UNIT/ML injection Inject 0.2 mLs (20 Units total) into the skin at bedtime.  Marland Kitchen lisinopril (PRINIVIL,ZESTRIL) 20 MG tablet Take 1 tablet (20 mg total) by mouth daily.  Marland Kitchen omeprazole (PRILOSEC) 40 MG capsule Take 1 capsule (40 mg total) by mouth daily.  . prochlorperazine (COMPAZINE) 10 MG tablet Take 1 tablet (10 mg total) by mouth every 6 (six) hours as needed for nausea.  . simvastatin (ZOCOR) 20 MG tablet Take 1 tablet (20 mg total) by mouth daily.  . traZODone (DESYREL) 50 MG tablet Take 1 tablet (50 mg total) by mouth at bedtime as needed. (Patient taking differently: Take 150 mg by mouth at bedtime as needed. )    No Known Allergies  BP 115/81 (BP Location: Right Arm, Patient Position: Sitting)   Pulse 89   Temp 98 F (36.7 C)   Ht 5\' 2"  (1.575 m)   Wt 231 lb 12.8 oz (105.1 kg)   BMI 42.40 kg/m   Review of Systems  Constitutional: Negative.   HENT: Positive for dental problem (acute tooth ache. multiple dental caries.) and ear pain.   Respiratory: Positive for cough and shortness of breath.   Cardiovascular: Negative.   Gastrointestinal: Positive for abdominal distention (Obese).  Endocrine: Negative.   Genitourinary: Negative.   Musculoskeletal: Positive for arthralgias (generalized joint pain).  Skin: Negative.   Allergic/Immunologic: Negative.   Neurological: Negative.   Hematological: Negative.   Psychiatric/Behavioral: Negative.    Objective:   Physical Exam  Constitutional: She is oriented to person, place, and time. She appears well-developed and  well-nourished.  HENT:  Head: Normocephalic.  Right Ear: External ear normal.  Left Ear: External ear normal.  Nose: Nose normal.  Mouth/Throat: Oropharynx is clear and moist.  Eyes: Pupils are equal, round, and reactive to light. Conjunctivae and EOM are normal.  Neck: Normal range of motion. Neck supple.  Cardiovascular: Normal rate, regular rhythm and intact distal pulses.  Murmur heard. Pulmonary/Chest: Effort normal and breath sounds normal.  Abdominal: Soft. Bowel sounds are normal. She exhibits distension (Obese).  Musculoskeletal:  Limited ROM in  back  Neurological: She is alert and oriented to person, place, and time.  Skin: Skin is warm and dry. Capillary refill takes less than 2 seconds.  Psychiatric: She has a normal mood and affect. Her behavior is normal. Judgment and thought content normal.  Nursing note and vitals reviewed.  Assessment & Plan:   1. Tooth abscess We will send Rx for Augmentin 875 mg to pharmacy today, to help with infection. Rx for Motrin to pharmacy. She will make appointment to follow up with dentist at our office.  - ibuprofen (ADVIL,MOTRIN) 800 MG tablet; Take 1 tablet (800 mg total) by mouth every 8 (eight) hours as needed.  Dispense: 30 tablet; Refill: 2  2. Moderate episode of recurrent major depressive disorder (HCC) Stable. She continues to follow up with Nira Conn here at [facility]. She will also continue to follow up with Dr. Leonides Schanz at [facility]. Continue Trazodone 150 mg as prescribed.   3. Diabetic polyneuropathy associated with type 2 diabetes mellitus (HCC) Stable. She will continue Neuropathy.  4. Uncontrolled type 2 diabetes mellitus with hyperglycemia (HCC) Hgb A1c was 10.0 on 07/12/2017. We will re-assess Hgb A1c on next OV. She is reminded to continue on DASH diet, get at least 30 minutes of cardio exercise daily.   5. Fatigue, unspecified type We will assess Vitamin B-12 and Vitamin D levels to assess fatigue.  - Vitamin D (25 hydroxy) -  Vitamin B12  6. Follow up She will keep scheduled follow up appointment for 10/02/2017.  Meds ordered this encounter  Medications  . ibuprofen (ADVIL,MOTRIN) 800 MG tablet    Sig: Take 1 tablet (800 mg total) by mouth every 8 (eight) hours as needed.    Dispense:  30 tablet    Refill:  2  . amoxicillin-clavulanate (AUGMENTIN) 875-125 MG tablet    Sig: Take 1 tablet by mouth 2 (two) times daily.    Dispense:  20 tablet    Refill:  0   Kathe Becton,  MSN, FNP-BC [place] [address] [place], [place] [id number] [phone]

## 2017-08-31 LAB — VITAMIN B12: Vitamin B-12: 369 pg/mL (ref 232–1245)

## 2017-08-31 LAB — VITAMIN D 25 HYDROXY (VIT D DEFICIENCY, FRACTURES): Vit D, 25-Hydroxy: 19.7 ng/mL — ABNORMAL LOW (ref 30.0–100.0)

## 2017-09-06 ENCOUNTER — Other Ambulatory Visit: Payer: Self-pay | Admitting: Adult Health

## 2017-09-06 ENCOUNTER — Encounter: Payer: Self-pay | Admitting: Pharmacist

## 2017-09-06 MED ORDER — VITAMIN D (ERGOCALCIFEROL) 1.25 MG (50000 UNIT) PO CAPS: 50000.0000 [IU] | ORAL_CAPSULE | ORAL | 5 refills | Status: DC

## 2017-09-06 NOTE — Progress Notes (Signed)
Vitamin D ordered

## 2017-09-07 ENCOUNTER — Telehealth: Payer: Self-pay

## 2017-09-07 NOTE — Telephone Encounter (Signed)
Lm for pt to call back re Vitamin D.

## 2017-09-07 NOTE — Telephone Encounter (Signed)
-----   Message from Erlene Quan, NP sent at 09/06/2017  1:20 PM EDT ----- Let patient know she has a script for vitamin D at the pharmacy ----- Message ----- From: Interface, [facility] Lab Results In Sent: 08/31/2017   7:35 AM To: Bod Results Pool

## 2017-09-11 ENCOUNTER — Ambulatory Visit: Payer: Medicaid Other | Admitting: Licensed Clinical Social Worker

## 2017-09-11 DIAGNOSIS — F411 Generalized anxiety disorder: Secondary | ICD-10-CM

## 2017-09-13 NOTE — Progress Notes (Signed)
Subjective:  Patient ID: Michelle Warner, female   DOB: [birth year], 42 y.o.   MRN: [id number]    Increase emotional regulation  Michelle Warner presents with anxiety. Onset of symptoms was in the last few years with gradually improving improving. Symptoms have been occurringNot influenced by the time of the day.  Symptoms are currently rated moderate. Associated signs and symptoms include: attention difficulties, highly negative, poor social interaction, sadness and anxiety.Financial difficulties Health problems.   She reports that she has been feeling stressed. She explains that her husband had a day's pay deducted from his last check so money is really tight. She explains that she plans to pawn a computer so that she can have the rest of the money to make the car payment. She notes that her husband works everyday and hasn't missed any work so she is frustrated with how things are financially. She reports that she has a hearing scheduled for disability in October but if she gets it, it will be awhile until she receives her money. She notes that she filled out a [employer] consent form due to not have enough money for food each month. She denies suicidal and homicidal thoughts.   Therapeutic Interventions: Cognitive Behavioral therapy was utilized by the clinician focusing on patient's anxiety and affect on normal cognition. Clinician processed with the client regarding how she has been doing since the last session. Clinician processed with the client regarding if anyone else in the household works. Clinician processed with the patient regarding that things happen that are out of our control all the time but getting frustrated and constantly worrying about things will not fix the situation. Clinician processed with the patient regarding to utilize the relaxation techniques that they have discussed at previous sessions along with behavioral activation when she is feeling stressed. Clinician followed up about the reason  she filled out the informed consent for the web based case management program called [employer].   Return visit in 2 weeks.    Effectiveness:  Established problem, stable/improving (1). Progressing It is felt more time is needed for Interventions to work. . Patient is fully  Other:  orientated to time and place. Patient's Appropriate into problems. Active. Thought process is  Coherent.Minimal: No identifiable suicidal ideation.  Patients presenting with no risk factors but with morbid ruminations; may be classified as minimal risk based on the severity of the depressive symptoms and None.   Homework: Clinician explained that she would upload her consent and make a referral for food stamps. Utilize relaxation techniques and behavioral activation.  Plan: Follow up with Julian Hy, LCSW at [facility] in two weeks or earlier if needed.

## 2017-09-25 ENCOUNTER — Ambulatory Visit: Payer: Medicaid Other | Admitting: Licensed Clinical Social Worker

## 2017-10-02 ENCOUNTER — Other Ambulatory Visit: Payer: Medicaid Other

## 2017-10-02 ENCOUNTER — Ambulatory Visit: Payer: Medicaid Other | Admitting: Licensed Clinical Social Worker

## 2017-10-02 DIAGNOSIS — Z09 Encounter for follow-up examination after completed treatment for conditions other than malignant neoplasm: Secondary | ICD-10-CM

## 2017-10-03 LAB — COMPREHENSIVE METABOLIC PANEL
ALBUMIN: 4.2 g/dL (ref 3.5–5.5)
ALT: 19 IU/L (ref 0–32)
AST: 12 IU/L (ref 0–40)
Albumin/Globulin Ratio: 1.5 (ref 1.2–2.2)
Alkaline Phosphatase: 79 IU/L (ref 39–117)
BUN / CREAT RATIO: 26 — AB (ref 9–23)
BUN: 12 mg/dL (ref 6–24)
Bilirubin Total: 0.3 mg/dL (ref 0.0–1.2)
CO2: 22 mmol/L (ref 20–29)
CREATININE: 0.47 mg/dL — AB (ref 0.57–1.00)
Calcium: 9.4 mg/dL (ref 8.7–10.2)
Chloride: 99 mmol/L (ref 96–106)
GFR calc non Af Amer: 124 mL/min/{1.73_m2} (ref >59)
GFR, EST AFRICAN AMERICAN: 143 mL/min/{1.73_m2} (ref >59)
GLUCOSE: 200 mg/dL — AB (ref 65–99)
Globulin, Total: 2.8 g/dL (ref 1.5–4.5)
Potassium: 4.8 mmol/L (ref 3.5–5.2)
Sodium: 138 mmol/L (ref 134–144)
TOTAL PROTEIN: 7 g/dL (ref 6.0–8.5)

## 2017-10-03 LAB — HEMOGLOBIN A1C
ESTIMATED AVERAGE GLUCOSE: 240 mg/dL
HEMOGLOBIN A1C: 10 % — AB (ref 4.8–5.6)

## 2017-10-03 LAB — LDL CHOLESTEROL, DIRECT: LDL DIRECT: 122 mg/dL — AB (ref 0–99)

## 2017-10-09 ENCOUNTER — Ambulatory Visit: Payer: Medicaid Other | Admitting: Licensed Clinical Social Worker

## 2017-10-11 ENCOUNTER — Ambulatory Visit: Payer: Medicaid Other

## 2017-10-11 VITALS — BP 119/76 | HR 94 | Temp 98.0°F | Ht 62.0 in | Wt 237.6 lb

## 2017-10-11 DIAGNOSIS — E1169 Type 2 diabetes mellitus with other specified complication: Secondary | ICD-10-CM

## 2017-10-11 DIAGNOSIS — E1165 Type 2 diabetes mellitus with hyperglycemia: Secondary | ICD-10-CM

## 2017-10-11 DIAGNOSIS — N390 Urinary tract infection, site not specified: Secondary | ICD-10-CM

## 2017-10-11 DIAGNOSIS — E119 Type 2 diabetes mellitus without complications: Secondary | ICD-10-CM

## 2017-10-11 DIAGNOSIS — E785 Hyperlipidemia, unspecified: Secondary | ICD-10-CM

## 2017-10-11 DIAGNOSIS — M545 Low back pain: Secondary | ICD-10-CM

## 2017-10-11 MED ORDER — INSULIN DETEMIR 100 UNIT/ML ~~LOC~~ SOLN: SUBCUTANEOUS | 2 refills | Status: DC

## 2017-10-11 MED ORDER — NITROFURANTOIN MONOHYD MACRO 100 MG PO CAPS: 100.0000 mg | ORAL_CAPSULE | Freq: Two times a day (BID) | ORAL | 0 refills | Status: DC

## 2017-10-11 MED ORDER — ROSUVASTATIN CALCIUM 10 MG PO TABS: 10.0000 mg | ORAL_TABLET | Freq: Every day | ORAL | 5 refills | Status: DC

## 2017-10-11 MED ORDER — NAPROXEN 250 MG PO TABS: 250.0000 mg | ORAL_TABLET | Freq: Two times a day (BID) | ORAL | 0 refills | Status: DC

## 2017-10-11 NOTE — Progress Notes (Unsigned)
Subjective:    Patient ID: Michelle Warner, female    DOB: [birth year], 42 y.o.   MRN: [id number]  HPI  Michelle Warner is a 42 yo F here for and presents w/ lower back pain. She denies an injury. She reports some pain with urinating. She endorses pain when turning.  DM: She is taking 20u Levemir  Patient Active Problem List   Diagnosis Date Noted  . Diabetic polyneuropathy ([place]) 04/17/2017  . Exercise counseling 12/11/2014  . Gravida 1 12/11/2014  . Parity 1 12/11/2014  . Abnormal presence of protein in urine 12/11/2014  . Dietary counseling and surveillance 12/11/2014  . Appendicitis 12/08/2014  . Body mass index of 60 or higher 08/20/2014  . Cannot sleep 08/20/2014  . Excessive and frequent menstruation 08/20/2014  . Dysmetabolic syndrome 29/93/7169  . Obstructive sleep apnea of adult 08/20/2014  . Type II diabetes mellitus, uncontrolled ([place]) 08/20/2014  . Morbid obesity ([place]) 11/26/2007  . Allergic rhinitis, seasonal 09/04/2007  . Blood in the urine 08/16/2007  . Routine general medical examination at a health care facility 08/16/2007  . Migraine without aura and responsive to treatment 05/01/2007  . Undiagnosed cardiac murmurs 05/01/2007  . Allergic rhinitis 10/23/2006  . Gastro-esophageal reflux disease without esophagitis 09/04/2006  . Hypertension goal BP (blood pressure) < 140/90 09/04/2006  . HLD (hyperlipidemia) 09/04/2006  . Anemia, iron deficiency 09/04/2006   Allergies as of 10/11/2017   No Known Allergies     Medication List        Accurate as of 10/11/17  6:23 PM. Always use your most recent med list.          albuterol 108 (90 Base) MCG/ACT inhaler Commonly known as:  PROVENTIL HFA;VENTOLIN HFA Inhale 1-2 puffs into the lungs every 6 (six) hours as needed for wheezing or shortness of breath.   albuterol (2.5 MG/3ML) 0.083% nebulizer solution Commonly known as:  PROVENTIL INHALE CONTENTS OF ONE VIAL USING NEBULIZER EVERY 6 HOURS AS NEEDED FOR WHEEZING OR  SHORTNESS OF BREATH   amoxicillin-clavulanate 875-125 MG tablet Commonly known as:  AUGMENTIN Take 1 tablet by mouth 2 (two) times daily.   cetirizine 10 MG tablet Commonly known as:  ZYRTEC Take 1 tablet (10 mg total) by mouth daily.   fluticasone 50 MCG/ACT nasal spray Commonly known as:  FLONASE Place 2 sprays into both nostrils daily.   gabapentin 300 MG capsule Commonly known as:  NEURONTIN Take 1 capsule (300 mg total) by mouth at bedtime.   glipiZIDE 10 MG tablet Commonly known as:  GLUCOTROL Take 1 tablet (10 mg total) by mouth 2 (two) times daily before a meal.   guaiFENesin 100 MG/5ML Soln Commonly known as:  ROBITUSSIN Take 5 mLs (100 mg total) by mouth every 4 (four) hours as needed for cough or to loosen phlegm.   hydrochlorothiazide 12.5 MG capsule Commonly known as:  MICROZIDE TAKE ONE TABLET BY MOUTH EVERY DAY   ibuprofen 800 MG tablet Commonly known as:  ADVIL,MOTRIN Take 1 tablet (800 mg total) by mouth every 8 (eight) hours as needed.   insulin detemir 100 UNIT/ML injection Commonly known as:  LEVEMIR Inject 0.2 mLs (20 Units total) into the skin at bedtime.   lisinopril 20 MG tablet Commonly known as:  PRINIVIL,ZESTRIL Take 1 tablet (20 mg total) by mouth daily.   omeprazole 40 MG capsule Commonly known as:  PRILOSEC Take 1 capsule (40 mg total) by mouth daily.   prochlorperazine 10 MG tablet Commonly known as:  COMPAZINE Take 1 tablet (10 mg total) by mouth every 6 (six) hours as needed for nausea.   simvastatin 20 MG tablet Commonly known as:  ZOCOR Take 1 tablet (20 mg total) by mouth daily.   traZODone 50 MG tablet Commonly known as:  DESYREL Take 1 tablet (50 mg total) by mouth at bedtime as needed.   Vitamin D (Ergocalciferol) 50000 units Caps capsule Commonly known as:  DRISDOL Take 1 capsule (50,000 Units total) by mouth every 7 (seven) days.        Review of Systems  Musculoskeletal: Positive for back pain.  All other  systems reviewed and are negative.  A1C remained at 10.0 since last draw.     Objective:   Physical Exam  Constitutional: She is oriented to person, place, and time. She appears well-developed and well-nourished.  HENT:  Head: Normocephalic and atraumatic.  Eyes: Conjunctivae are normal. No scleral icterus.  Neck: No thyromegaly present.  Cardiovascular: Normal rate, regular rhythm and normal heart sounds.  Pulmonary/Chest: Effort normal.  Abdominal: Soft.  Musculoskeletal: She exhibits no edema.  Feet:  Right Foot:  Protective Sensation: 1 site tested. 1 site sensed. Left Foot:  Protective Sensation: 1 site tested. 1 site sensed. Neurological: She is alert and oriented to person, place, and time.  Foot exam normal.  Skin: Skin is warm and dry.  Hirsutism noted  Psychiatric: She has a normal mood and affect. Her behavior is normal. Judgment and thought content normal.          Assessment & Plan:   Low back pain:/Muscular Urine culture tonight  Rx Nitrofurantoin 100mg  for possible UTI Rx Naproxen 250mg    DM: Increase Levemir to 40u - 20u in morning and at night  Elevated LDL: Elevated at 122 /goal less than 70 if possible. Rx Rosuvastatin 10mg     F/u in 1 mo to evaluate symptoms and routine labs 1 week prior

## 2017-10-12 ENCOUNTER — Telehealth: Payer: Self-pay | Admitting: Pharmacist

## 2017-10-12 NOTE — Telephone Encounter (Signed)
10/12/2017 10:01:13 AM - Levemir Vials refill  10/12/17 Faxed [employer] refill request for Levemir Vials Inject 20 units under the skin daily at bedtime #3.Delos Haring

## 2017-10-13 LAB — URINE CULTURE

## 2017-10-16 ENCOUNTER — Ambulatory Visit: Payer: Medicaid Other | Admitting: Pharmacist

## 2017-10-16 ENCOUNTER — Other Ambulatory Visit: Payer: Self-pay

## 2017-10-16 VITALS — Ht 62.0 in | Wt 237.0 lb

## 2017-10-16 DIAGNOSIS — Z79899 Other long term (current) drug therapy: Secondary | ICD-10-CM

## 2017-10-19 ENCOUNTER — Encounter: Payer: Self-pay | Admitting: Pharmacist

## 2017-10-19 NOTE — Progress Notes (Signed)
Medication Management Clinic Visit Note  Patient: Michelle Warner MRN: [id number] Date of Birth: 02-Aug-1975 PCP: Patient, No Pcp Per   Shelton Silvas 42 y.o. female presents for a follow up medication therapy management visit today with the pharmacist. She complains of a cough at nighttime and pain in her back, legs, knees, feet and joints.  Of note, she was late for her appointment so I was only able to complete the medication reconciliation portion.  There were no vitals taken for this visit.  Patient Information   Past Medical History:  Diagnosis Date  . Anxiety   . Appendicitis   . Depression   . Diabetes mellitus without complication ([place])   . Heart murmur   . Hypercholesterolemia   . Hypertension       Past Surgical History:  Procedure Laterality Date  . APPENDECTOMY    . CESAREAN SECTION    . LAPAROSCOPIC APPENDECTOMY N/A 12/08/2014   Procedure: APPENDECTOMY LAPAROSCOPIC;  Surgeon: Sherri Rad, MD;  Location: [facility] ORS;  Service: General;  Laterality: N/A;  . TONSILLECTOMY       Family History  Problem Relation Age of Onset  . Hypertension Mother   . Heart disease Mother   . Hypertension Father   . Stroke Father   . Heart disease Father     New Diagnoses (since last visit):             Social History   Substance and Sexual Activity  Alcohol Use No      Social History   Tobacco Use  Smoking Status Former Smoker  . Years: 10.00  . Types: Cigarettes  . Last attempt to quit: 1996  . Years since quitting: 23.6  Smokeless Tobacco Former Systems developer  . Types: Snuff      Health Maintenance  Topic Date Due  . PNEUMOCOCCAL POLYSACCHARIDE VACCINE (1) 04/22/1979  . OPHTHALMOLOGY EXAM  04/22/1987  . HIV Screening  04/21/1992  . TETANUS/TDAP  04/21/1996  . PAP SMEAR  04/21/1998  . INFLUENZA VACCINE  10/18/2017  . HEMOGLOBIN A1C  04/04/2018  . FOOT EXAM  05/30/2018   Outpatient Encounter Medications as of 10/16/2017  Medication Sig  . acetaminophen  (TYLENOL) 500 MG tablet Take 1,000 mg by mouth at bedtime. For back, leg and knee pain  . albuterol (PROVENTIL HFA;VENTOLIN HFA) 108 (90 Base) MCG/ACT inhaler Inhale 1-2 puffs into the lungs every 6 (six) hours as needed for wheezing or shortness of breath.  Marland Kitchen albuterol (PROVENTIL) (2.5 MG/3ML) 0.083% nebulizer solution INHALE CONTENTS OF ONE VIAL USING NEBULIZER EVERY 6 HOURS AS NEEDED FOR WHEEZING OR SHORTNESS OF BREATH  . cetirizine (ZYRTEC) 10 MG tablet Take 1 tablet (10 mg total) by mouth daily.  . fluticasone (FLONASE) 50 MCG/ACT nasal spray Place 2 sprays into both nostrils daily.  Marland Kitchen gabapentin (NEURONTIN) 300 MG capsule Take 1 capsule (300 mg total) by mouth at bedtime.  Marland Kitchen glipiZIDE (GLUCOTROL) 10 MG tablet Take 1 tablet (10 mg total) by mouth 2 (two) times daily before a meal.  . hydrochlorothiazide (MICROZIDE) 12.5 MG capsule TAKE ONE TABLET BY MOUTH EVERY DAY  . insulin detemir (LEVEMIR) 100 UNIT/ML injection Take 20 units in the morning and 20 units at night  . lisinopril (PRINIVIL,ZESTRIL) 20 MG tablet Take 1 tablet (20 mg total) by mouth daily.  . naproxen (NAPROSYN) 250 MG tablet Take 1 tablet (250 mg total) by mouth 2 (two) times daily with a meal. (Patient taking differently: Take 250 mg by mouth 2 (  two) times daily with a meal. Taking 1 tablet twice daily due to pain)  . nitrofurantoin, macrocrystal-monohydrate, (MACROBID) 100 MG capsule Take 1 capsule (100 mg total) by mouth 2 (two) times daily.  Marland Kitchen omeprazole (PRILOSEC) 20 MG capsule Take 40 mg by mouth daily. Takes 20-40mg  daily  . rosuvastatin (CRESTOR) 10 MG tablet Take 1 tablet (10 mg total) by mouth daily.  . traZODone (DESYREL) 50 MG tablet Take 1 tablet (50 mg total) by mouth at bedtime as needed. (Patient taking differently: Take 50 mg by mouth at bedtime as needed. Prescribed 1 1/2 - 3 tablets at bedtime. Take 2 tablets.)  . Vitamin D, Ergocalciferol, (DRISDOL) 50000 units CAPS capsule Take 1 capsule (50,000 Units total) by  mouth every 7 (seven) days.  . [DISCONTINUED] omeprazole (PRILOSEC) 40 MG capsule Take 1 capsule (40 mg total) by mouth daily.  Marland Kitchen ibuprofen (ADVIL,MOTRIN) 800 MG tablet Take 1 tablet (800 mg total) by mouth every 8 (eight) hours as needed. (Patient not taking: Reported on 10/16/2017)  . [DISCONTINUED] amoxicillin-clavulanate (AUGMENTIN) 875-125 MG tablet Take 1 tablet by mouth 2 (two) times daily.  . [DISCONTINUED] guaiFENesin (ROBITUSSIN) 100 MG/5ML SOLN Take 5 mLs (100 mg total) by mouth every 4 (four) hours as needed for cough or to loosen phlegm.  . [DISCONTINUED] prochlorperazine (COMPAZINE) 10 MG tablet Take 1 tablet (10 mg total) by mouth every 6 (six) hours as needed for nausea.  . [DISCONTINUED] simvastatin (ZOCOR) 20 MG tablet Take 1 tablet (20 mg total) by mouth daily.   No facility-administered encounter medications on file as of 10/16/2017.      Assessment  Compliance:  Mrs. Donaho did not bring any of her medication bottles or a list to her appointment today. She had difficulty recalling which medications she is currently taking. I was able to bring up each prescription in QS1 and show her a picture of her pill/capsule for identification. Diabetes: Glipizide and Levemir States metformin was discontinued due to intolerance (stomach upset). It is not clear if she tried metformin ER.  A1c = 10% (10-02-17). No change from the last A1c. Hypertension: Lisinopril and HCTZ Not able to assess today. HLD: Rosuvastatin Direct LDL = 122 mg/dl (10-02-17). Previously on simvastatin 20 mg daily, now on rosuvastatin 10 mg daily. States she was having joint pain with the simvastatin. She agreed to a trial of rosuvastatin. Depression/Anxiety/Sleep: Citalopram and Trazodone Currently receiving treatment through [facility]. Also sees the Education officer, museum at [facility]. GERD: Omeprazole Omeprazole seems to be working although she did mention increased nausea at night causing her to "twist and turn". Pain: Naproxen,  Ibuprofen, Gabapentin, Vitamin D Complains of pain in her back, legs, knees and feet and tingling in the feet. Due to her back pain, patient has been taking the Naproxen as 1 tablet twice daily, prescribed 1/2 tablet twice daily. Educated patient on the dangers of taking ibuprofen and naproxen at the same time, encouraged her to take one or the other, she verbalized understanding. Of note, she was recently treated for a UTI. (nitrofurantoin) Respiratory: Ventolin HFA, Albuterol solution, Fluticasone, and Cetirizine: States she mainly uses either the Ventolin or albuterol via nebulizer at night due to cough.  PLAN: 3 month follow up scheduled to reassess compliance and understanding of medications Follow up scheduled with [facility] 11-13-17 Pharmacist to contact [facility] about rescheduling her missed appointment with the Social Worker.  Marquetta Weiskopf K. Dicky Doe, PharmD [facility] [place] Operations Coordinator [phone]

## 2017-10-26 ENCOUNTER — Telehealth: Payer: Self-pay | Admitting: Pharmacist

## 2017-10-26 NOTE — Telephone Encounter (Signed)
10/26/2017 9:28:16 AM - Levemir Vials-Dose Change  10/26/17 I have faxed [facility] a refill requests for Dose Change-Levemir Vial Inject 20 units every morning & Inject 20 units every evening #5 (max daily dose 40 units).Delos Haring

## 2017-11-07 ENCOUNTER — Other Ambulatory Visit: Payer: Medicaid Other

## 2017-11-07 DIAGNOSIS — E1169 Type 2 diabetes mellitus with other specified complication: Secondary | ICD-10-CM

## 2017-11-07 DIAGNOSIS — E1165 Type 2 diabetes mellitus with hyperglycemia: Secondary | ICD-10-CM

## 2017-11-07 DIAGNOSIS — E785 Hyperlipidemia, unspecified: Principal | ICD-10-CM

## 2017-11-07 DIAGNOSIS — E875 Hyperkalemia: Secondary | ICD-10-CM

## 2017-11-08 LAB — BASIC METABOLIC PANEL
BUN/Creatinine Ratio: 24 — ABNORMAL HIGH (ref 9–23)
BUN: 13 mg/dL (ref 6–24)
CHLORIDE: 103 mmol/L (ref 96–106)
CO2: 22 mmol/L (ref 20–29)
Calcium: 9.1 mg/dL (ref 8.7–10.2)
Creatinine, Ser: 0.54 mg/dL — ABNORMAL LOW (ref 0.57–1.00)
GFR, EST AFRICAN AMERICAN: 137 mL/min/{1.73_m2} (ref >59)
GFR, EST NON AFRICAN AMERICAN: 118 mL/min/{1.73_m2} (ref >59)
Glucose: 315 mg/dL — ABNORMAL HIGH (ref 65–99)
POTASSIUM: 4.6 mmol/L (ref 3.5–5.2)
Sodium: 139 mmol/L (ref 134–144)

## 2017-11-08 LAB — LIPID PANEL
CHOL/HDL RATIO: 3.7 ratio (ref 0.0–4.4)
CHOLESTEROL TOTAL: 142 mg/dL (ref 100–199)
HDL: 38 mg/dL — AB (ref >39)
LDL Calculated: 57 mg/dL (ref 0–99)
Triglycerides: 237 mg/dL — ABNORMAL HIGH (ref 0–149)
VLDL Cholesterol Cal: 47 mg/dL — ABNORMAL HIGH (ref 5–40)

## 2017-11-13 ENCOUNTER — Telehealth: Payer: Self-pay | Admitting: Adult Health Nurse Practitioner

## 2017-11-13 ENCOUNTER — Ambulatory Visit: Payer: Medicaid Other | Admitting: Licensed Clinical Social Worker

## 2017-11-13 ENCOUNTER — Ambulatory Visit: Payer: Medicaid Other

## 2017-11-13 NOTE — Telephone Encounter (Signed)
Patient called to reschedule appointments. Appointments have been rescheduled. Left voicemail for patient on 11/12/17 informing of appointments.

## 2017-11-20 ENCOUNTER — Telehealth: Payer: Self-pay | Admitting: Pharmacist

## 2017-11-20 ENCOUNTER — Ambulatory Visit: Payer: Medicaid Other | Admitting: Family Medicine

## 2017-11-20 ENCOUNTER — Ambulatory Visit: Payer: Medicaid Other | Admitting: Licensed Clinical Social Worker

## 2017-11-20 VITALS — BP 127/88 | HR 93 | Temp 98.8°F | Wt 237.8 lb

## 2017-11-20 DIAGNOSIS — K219 Gastro-esophageal reflux disease without esophagitis: Secondary | ICD-10-CM

## 2017-11-20 DIAGNOSIS — Z09 Encounter for follow-up examination after completed treatment for conditions other than malignant neoplasm: Secondary | ICD-10-CM

## 2017-11-20 DIAGNOSIS — E1169 Type 2 diabetes mellitus with other specified complication: Secondary | ICD-10-CM

## 2017-11-20 DIAGNOSIS — E785 Hyperlipidemia, unspecified: Principal | ICD-10-CM

## 2017-11-20 DIAGNOSIS — F411 Generalized anxiety disorder: Secondary | ICD-10-CM

## 2017-11-20 DIAGNOSIS — I10 Essential (primary) hypertension: Secondary | ICD-10-CM

## 2017-11-20 DIAGNOSIS — E119 Type 2 diabetes mellitus without complications: Secondary | ICD-10-CM

## 2017-11-20 NOTE — Telephone Encounter (Signed)
11/20/2017 12:06:21 PM - Ventolin HFA refill  11/20/17 Placed refill online for Ventolin HFA, to release 12/03/17, order# [id number].Delos Haring

## 2017-11-20 NOTE — Progress Notes (Signed)
Subjective:  Patient ID: Michelle Warner, female   DOB: May 04, 1975, 42 y.o.   MRN: [id number]    Increase emotional regulation  [name] presents with anxiety. Onset of symptoms was in the last year in half with gradually improving improving. Symptoms have been occurringNot influenced by the time of the day.  Symptoms are currently rated moderate. Associated signs and symptoms include: attention difficulties, highly negative, poor social interaction, sadness and feeling overwhelmed.Financial difficulties Health problems.   She reports that she has been doing okay. She explains that her anxiety has been high due to worrying about her sister and being able to pay all the bills. She explains that she is trying to keep her mind straight. She notes that she has a hearing scheduled for disability in October. She explains that the air conditioning at the house was partially fixed. She explains that she is sweating a lot and can't figure out why. She notes that transportation is limited and it's one of the reasons why she has had to cancel appointments. She notes that she is also worried about her sister who lies to her about drinking and using drugs. She notes that she wants to get her help but isn't sure what to do. She denies suicidal and homicidal thoughts.   Therapeutic Interventions: Cognitive Behavioral therapy was utilized by the clinician focusing on anxiety and affect on normal cognition. Clinician processed with the patient regarding how she has been doing since the last session. Clinician processed with the patient regarding the triggers for her anxiety symptoms. Clinician explained that it sounds like the disability process is under way and encouraged the patient to focus on the positive. Clinician explained that it sounds like she is just going through a difficult time in terms of problems with her family and finances. Clinician encouraged the patient to utilize her coping skills.   Return visit in  3 weeks.    Effectiveness:  Review of last therapy session (1). Progressing It is felt more time is needed for Interventions to work. . Patient is fully Other:  orientated to time and place. Patient's Appropriate into problems. Active. Thought process is  Coherent.Minimal: No identifiable suicidal ideation.  Patients presenting with no risk factors but with morbid ruminations; may be classified as minimal risk based on the severity of the depressive symptoms and None.   Homework: Utilize coping skills and practice self care.   Plan: Follow up with Julian Hy, LCSW at [facility] in three weeks or earlier if needed.

## 2017-11-20 NOTE — Progress Notes (Signed)
Subjective:    Patient ID: Michelle Warner, female    DOB: [birth year], 42 y.o.   MRN: [id number]  HPI  Michelle Warner is a 42 yo F here for 1 mo f/u for DM, elevated LDL, and lab results. She was recently seen at the ED on 11/16/17 for Cellulitis. She was started on Clindamycin 450mg  for 7 days and advised to take Sitz baths. She also complains of diarrhea and abdominal pain this weekend.  DM: She reports she is taking 30 u of Levemir at night. She reports she is only taking it at night because she didn't want to run out of needles. She endorses frequent urination.  Diet: She reports she is working on her diet and drinking mostly water.  Exercise: She reports she walks up and down her street.  She is concerned about her weight gain over the past year. Since January, she has gained 21 pounds.    Patient Active Problem List   Diagnosis Date Noted  . Diabetic polyneuropathy ([place]) 04/17/2017  . Exercise counseling 12/11/2014  . Gravida 1 12/11/2014  . Parity 1 12/11/2014  . Abnormal presence of protein in urine 12/11/2014  . Dietary counseling and surveillance 12/11/2014  . Appendicitis 12/08/2014  . Body mass index of 60 or higher 08/20/2014  . Cannot sleep 08/20/2014  . Excessive and frequent menstruation 08/20/2014  . Dysmetabolic syndrome 46/65/9935  . Obstructive sleep apnea of adult 08/20/2014  . Type II diabetes mellitus, uncontrolled ([place]) 08/20/2014  . Morbid obesity ([place]) 11/26/2007  . Allergic rhinitis, seasonal 09/04/2007  . Blood in the urine 08/16/2007  . Routine general medical examination at a health care facility 08/16/2007  . Migraine without aura and responsive to treatment 05/01/2007  . Undiagnosed cardiac murmurs 05/01/2007  . Allergic rhinitis 10/23/2006  . Gastro-esophageal reflux disease without esophagitis 09/04/2006  . Hypertension goal BP (blood pressure) < 140/90 09/04/2006  . HLD (hyperlipidemia) 09/04/2006  . Anemia, iron deficiency 09/04/2006   Allergies  as of 11/20/2017   No Known Allergies     Medication List        Accurate as of 11/20/17  6:42 PM. Always use your most recent med list.          acetaminophen 500 MG tablet Commonly known as:  TYLENOL Take 1,000 mg by mouth at bedtime. For back, leg and knee pain   albuterol 108 (90 Base) MCG/ACT inhaler Commonly known as:  PROVENTIL HFA;VENTOLIN HFA Inhale 1-2 puffs into the lungs every 6 (six) hours as needed for wheezing or shortness of breath.   albuterol (2.5 MG/3ML) 0.083% nebulizer solution Commonly known as:  PROVENTIL INHALE CONTENTS OF ONE VIAL USING NEBULIZER EVERY 6 HOURS AS NEEDED FOR WHEEZING OR SHORTNESS OF BREATH   cetirizine 10 MG tablet Commonly known as:  ZYRTEC Take 1 tablet (10 mg total) by mouth daily.   fluticasone 50 MCG/ACT nasal spray Commonly known as:  FLONASE Place 2 sprays into both nostrils daily.   gabapentin 300 MG capsule Commonly known as:  NEURONTIN Take 1 capsule (300 mg total) by mouth at bedtime.   glipiZIDE 10 MG tablet Commonly known as:  GLUCOTROL Take 1 tablet (10 mg total) by mouth 2 (two) times daily before a meal.   hydrochlorothiazide 12.5 MG capsule Commonly known as:  MICROZIDE TAKE ONE TABLET BY MOUTH EVERY DAY   ibuprofen 800 MG tablet Commonly known as:  ADVIL,MOTRIN Take 1 tablet (800 mg total) by mouth every 8 (eight) hours as needed.  insulin detemir 100 UNIT/ML injection Commonly known as:  LEVEMIR Take 20 units in the morning and 20 units at night   lisinopril 20 MG tablet Commonly known as:  PRINIVIL,ZESTRIL Take 1 tablet (20 mg total) by mouth daily.   naproxen 250 MG tablet Commonly known as:  NAPROSYN Take 1 tablet (250 mg total) by mouth 2 (two) times daily with a meal.   nitrofurantoin (macrocrystal-monohydrate) 100 MG capsule Commonly known as:  MACROBID Take 1 capsule (100 mg total) by mouth 2 (two) times daily.   omeprazole 20 MG capsule Commonly known as:  PRILOSEC Take 40 mg by mouth  daily. Takes 20-40mg  daily   rosuvastatin 10 MG tablet Commonly known as:  CRESTOR Take 1 tablet (10 mg total) by mouth daily.   traZODone 50 MG tablet Commonly known as:  DESYREL Take 1 tablet (50 mg total) by mouth at bedtime as needed.   Vitamin D (Ergocalciferol) 50000 units Caps capsule Commonly known as:  DRISDOL Take 1 capsule (50,000 Units total) by mouth every 7 (seven) days.        Review of Systems  All other systems reviewed and are negative.  Triglycerides elevated at 237. LDL down to 57 from 122.     Objective:   Physical Exam  Constitutional: She is oriented to person, place, and time. She appears well-developed and well-nourished.  Cardiovascular: Normal rate, regular rhythm and normal heart sounds.  Pulmonary/Chest: Effort normal and breath sounds normal.  Abdominal: Soft. Bowel sounds are normal.  Neurological: She is alert and oriented to person, place, and time.  Skin: Skin is warm and dry.  Psychiatric: She has a normal mood and affect. Her behavior is normal. Judgment and thought content normal.  Vitals reviewed.   BP 127/88   Pulse 93   Temp 98.8 F (37.1 C)   Wt 237 lb 12.8 oz (107.9 kg)   BMI 43.49 kg/m    Assessment & Plan:   DM: Not controlled.   Informed pt that she can get more needles at the pharmacy.  Discussed with Pt importance of taking Levemir in morning and at night. She will begin injecting Levemir as Rx, 20 u in morning and 20 u at night.   Weight gain: Discussed with Pt importance of eating a balanced diet and increasing exercise.   F/u in 2 mo w/ labs 1 week prior.   No orders of the defined types were placed in this encounter.  Kathe Becton,  MSN, FNP-C [facility] [address] [place], [place] [address] [phone]

## 2017-12-11 ENCOUNTER — Ambulatory Visit: Payer: Medicaid Other | Admitting: Licensed Clinical Social Worker

## 2017-12-11 DIAGNOSIS — F411 Generalized anxiety disorder: Secondary | ICD-10-CM

## 2017-12-12 ENCOUNTER — Emergency Department
Admission: EM | Admit: 2017-12-12 | Discharge: 2017-12-12 | Disposition: A | Discharge status: Home / Self Care | Payer: 59 | Attending: Emergency Medicine | Admitting: Emergency Medicine

## 2017-12-12 ENCOUNTER — Encounter: Payer: Self-pay | Admitting: Emergency Medicine

## 2017-12-12 DIAGNOSIS — S20469A Insect bite (nonvenomous) of unspecified back wall of thorax, initial encounter: Secondary | ICD-10-CM | POA: Insufficient documentation

## 2017-12-12 DIAGNOSIS — W57XXXA Bitten or stung by nonvenomous insect and other nonvenomous arthropods, initial encounter: Secondary | ICD-10-CM | POA: Insufficient documentation

## 2017-12-12 DIAGNOSIS — Y999 Unspecified external cause status: Secondary | ICD-10-CM | POA: Insufficient documentation

## 2017-12-12 DIAGNOSIS — Z79899 Other long term (current) drug therapy: Secondary | ICD-10-CM | POA: Insufficient documentation

## 2017-12-12 DIAGNOSIS — Z794 Long term (current) use of insulin: Secondary | ICD-10-CM | POA: Insufficient documentation

## 2017-12-12 DIAGNOSIS — Y939 Activity, unspecified: Secondary | ICD-10-CM | POA: Diagnosis not present

## 2017-12-12 DIAGNOSIS — I10 Essential (primary) hypertension: Secondary | ICD-10-CM | POA: Diagnosis not present

## 2017-12-12 DIAGNOSIS — L299 Pruritus, unspecified: Secondary | ICD-10-CM | POA: Diagnosis present

## 2017-12-12 DIAGNOSIS — E119 Type 2 diabetes mellitus without complications: Secondary | ICD-10-CM | POA: Insufficient documentation

## 2017-12-12 DIAGNOSIS — Y929 Unspecified place or not applicable: Secondary | ICD-10-CM | POA: Diagnosis not present

## 2017-12-12 DIAGNOSIS — Z87891 Personal history of nicotine dependence: Secondary | ICD-10-CM | POA: Insufficient documentation

## 2017-12-12 DIAGNOSIS — T63441A Toxic effect of venom of bees, accidental (unintentional), initial encounter: Secondary | ICD-10-CM

## 2017-12-12 LAB — GLUCOSE, CAPILLARY: GLUCOSE-CAPILLARY: 216 mg/dL — AB (ref 70–99)

## 2017-12-12 MED ORDER — DIPHENHYDRAMINE HCL 25 MG PO CAPS: 25.0000 mg | ORAL_CAPSULE | Freq: Four times a day (QID) | ORAL | 0 refills | Status: DC | PRN

## 2017-12-12 MED ORDER — PREDNISONE 10 MG PO TABS: 40.0000 mg | ORAL_TABLET | Freq: Every day | ORAL | 0 refills | Status: DC

## 2017-12-12 MED ORDER — METHYLPREDNISOLONE SODIUM SUCC 125 MG IJ SOLR
80.0000 mg | Freq: Once | INTRAMUSCULAR | Status: AC
  Administered 2017-12-12: 80 mg via INTRAMUSCULAR
  Filled 2017-12-12: qty 2

## 2017-12-12 MED ORDER — PREDNISONE 10 MG PO TABS: ORAL_TABLET | ORAL | 0 refills | Status: DC

## 2017-12-12 MED ORDER — DIPHENHYDRAMINE HCL 50 MG/ML IJ SOLN
25.0000 mg | Freq: Once | INTRAMUSCULAR | Status: AC
  Administered 2017-12-12: 25 mg via INTRAMUSCULAR
  Filled 2017-12-12: qty 1

## 2017-12-12 NOTE — ED Triage Notes (Addendum)
Patient presents to the ED with multiple insect bites from bees yesterday.  Patient states she was mowing the yard and hit a nest.  Patient states she has taken benadryl but is still having some itching and burning.  Denies any difficulty breathing.

## 2017-12-12 NOTE — ED Notes (Signed)
See triage note  Presents with multiple bee stings since yesterday   Red areas noted to neck,and both arms

## 2017-12-12 NOTE — ED Provider Notes (Signed)
[facility] Emergency Department Provider Note  ____________________________________________  Time seen: Approximately 2:46 PM  I have reviewed the triage vital signs and the nursing notes.   HISTORY  Chief Complaint Insect Bite    HPI Michelle Warner is a 42 y.o. female that presents to the emergency department for evaluation of multiple bee stings yesterday.  Patient states that she was stung by 20+ bees.  Patient has taken Benadryl but is still itching.  No throat tingling, shortness of breath.   Past Medical History:  Diagnosis Date  . Anxiety   . Appendicitis   . Depression   . Diabetes mellitus without complication ([place])   . Heart murmur   . Hypercholesterolemia   . Hypertension     Patient Active Problem List   Diagnosis Date Noted  . Diabetic polyneuropathy ([place]) 04/17/2017  . Exercise counseling 12/11/2014  . Gravida 1 12/11/2014  . Parity 1 12/11/2014  . Abnormal presence of protein in urine 12/11/2014  . Dietary counseling and surveillance 12/11/2014  . Appendicitis 12/08/2014  . Body mass index of 60 or higher 08/20/2014  . Cannot sleep 08/20/2014  . Excessive and frequent menstruation 08/20/2014  . Dysmetabolic syndrome 98/92/1194  . Obstructive sleep apnea of adult 08/20/2014  . Type II diabetes mellitus, uncontrolled ([place]) 08/20/2014  . Morbid obesity ([place]) 11/26/2007  . Allergic rhinitis, seasonal 09/04/2007  . Blood in the urine 08/16/2007  . Routine general medical examination at a health care facility 08/16/2007  . Migraine without aura and responsive to treatment 05/01/2007  . Undiagnosed cardiac murmurs 05/01/2007  . Allergic rhinitis 10/23/2006  . Gastro-esophageal reflux disease without esophagitis 09/04/2006  . Hypertension goal BP (blood pressure) < 140/90 09/04/2006  . HLD (hyperlipidemia) 09/04/2006  . Anemia, iron deficiency 09/04/2006    Past Surgical History:  Procedure Laterality Date  . APPENDECTOMY    .  CESAREAN SECTION    . LAPAROSCOPIC APPENDECTOMY N/A 12/08/2014   Procedure: APPENDECTOMY LAPAROSCOPIC;  Surgeon: Sherri Rad, MD;  Location: [facility] ORS;  Service: General;  Laterality: N/A;  . TONSILLECTOMY      Prior to Admission medications   Medication Sig Start Date End Date Taking? Authorizing Provider  acetaminophen (TYLENOL) 500 MG tablet Take 1,000 mg by mouth at bedtime. For back, leg and knee pain    [provider]  albuterol (PROVENTIL HFA;VENTOLIN HFA) 108 (90 Base) MCG/ACT inhaler Inhale 1-2 puffs into the lungs every 6 (six) hours as needed for wheezing or shortness of breath. 04/12/17   Doles-Johnson, Teah, NP  albuterol (PROVENTIL) (2.5 MG/3ML) 0.083% nebulizer solution INHALE CONTENTS OF ONE VIAL USING NEBULIZER EVERY 6 HOURS AS NEEDED FOR WHEEZING OR SHORTNESS OF BREATH 06/25/17   Doles-Johnson, Teah, NP  cetirizine (ZYRTEC) 10 MG tablet Take 1 tablet (10 mg total) by mouth daily. 07/12/17   Doles-Johnson, Teah, NP  diphenhydrAMINE (BENADRYL) 25 mg capsule Take 1 capsule (25 mg total) by mouth every 6 (six) hours as needed. 12/12/17 12/12/18  Laban Emperor, PA-C  fluticasone (FLONASE) 50 MCG/ACT nasal spray Place 2 sprays into both nostrils daily. 07/12/17   Doles-Johnson, Teah, NP  gabapentin (NEURONTIN) 300 MG capsule Take 1 capsule (300 mg total) by mouth at bedtime. 07/12/17   Doles-Johnson, Teah, NP  glipiZIDE (GLUCOTROL) 10 MG tablet Take 1 tablet (10 mg total) by mouth 2 (two) times daily before a meal. 07/12/17   Doles-Johnson, Teah, NP  hydrochlorothiazide (MICROZIDE) 12.5 MG capsule TAKE ONE TABLET BY MOUTH EVERY DAY 07/27/17  Doles-Johnson, Teah, NP  ibuprofen (ADVIL,MOTRIN) 800 MG tablet Take 1 tablet (800 mg total) by mouth every 8 (eight) hours as needed. 08/30/17   Azzie Glatter, FNP  insulin detemir (LEVEMIR) 100 UNIT/ML injection Take 20 units in the morning and 20 units at night 10/11/17   Jerrol Banana., MD  lisinopril (PRINIVIL,ZESTRIL) 20 MG tablet  Take 1 tablet (20 mg total) by mouth daily. 07/12/17   Doles-Johnson, Teah, NP  naproxen (NAPROSYN) 250 MG tablet Take 1 tablet (250 mg total) by mouth 2 (two) times daily with a meal. Patient taking differently: Take 250 mg by mouth 2 (two) times daily with a meal. Taking 1 tablet twice daily due to pain 10/11/17   Jerrol Banana., MD  nitrofurantoin, macrocrystal-monohydrate, (MACROBID) 100 MG capsule Take 1 capsule (100 mg total) by mouth 2 (two) times daily. 10/11/17   Jerrol Banana., MD  omeprazole (PRILOSEC) 20 MG capsule Take 40 mg by mouth daily. Takes 20-40mg  daily    [provider]  predniSONE (DELTASONE) 10 MG tablet Take 6 tablets on day 1, take 5 tablets on day 2, take 4 tablets on day 3, take 3 tablets on day 4, take 2 tablets on day 5, take 1 tablet on day 6 12/12/17   Laban Emperor, PA-C  rosuvastatin (CRESTOR) 10 MG tablet Take 1 tablet (10 mg total) by mouth daily. 10/11/17   Jerrol Banana., MD  traZODone (DESYREL) 50 MG tablet Take 1 tablet (50 mg total) by mouth at bedtime as needed. Patient taking differently: Take 50 mg by mouth at bedtime as needed. Prescribed 1 1/2 - 3 tablets at bedtime. Take 2 tablets. 01/11/15   [name], MD  Vitamin D, Ergocalciferol, (DRISDOL) 50000 units CAPS capsule Take 1 capsule (50,000 Units total) by mouth every 7 (seven) days. 09/06/17   Erlene Quan, NP    Allergies Patient has no known allergies.  Family History  Problem Relation Age of Onset  . Hypertension Mother   . Heart disease Mother   . Hypertension Father   . Stroke Father   . Heart disease Father     Social History Social History   Tobacco Use  . Smoking status: Former Smoker    Years: 10.00    Types: Cigarettes    Last attempt to quit: 1996    Years since quitting: 23.7  . Smokeless tobacco: Former Systems developer    Types: Snuff  Substance Use Topics  . Alcohol use: No  . Drug use: No     Review of Systems  Constitutional:  No fever/chills Cardiovascular: No chest pain. Respiratory: No SOB. Gastrointestinal: No abdominal pain.  No nausea, no vomiting.  Musculoskeletal: Negative for musculoskeletal pain. Skin: Negative for abrasions, lacerations, ecchymosis.  Positive for rash.  ____________________________________________   PHYSICAL EXAM:  VITAL SIGNS: ED Triage Vitals  Enc Vitals Group     BP 12/12/17 1328 123/85     Pulse Rate 12/12/17 1328 81     Resp 12/12/17 1328 18     Temp 12/12/17 1328 98.4 F (36.9 C)     Temp Source 12/12/17 1328 Oral     SpO2 12/12/17 1328 95 %     Weight 12/12/17 1329 232 lb (105.2 kg)     Height 12/12/17 1329 5\' 2"  (1.575 m)     Head Circumference --      Peak Flow --      Pain Score 12/12/17 1328 9  Pain Loc --      Pain Edu? --      Excl. in [place]? --      Constitutional: Alert and oriented. Well appearing and in no acute distress. Eyes: Conjunctivae are normal. PERRL. EOMI. Head: Atraumatic. ENT:      Ears:      Nose: No congestion/rhinnorhea.      Mouth/Throat: Mucous membranes are moist.  Neck: No stridor.   Cardiovascular: Normal rate, regular rhythm.  Good peripheral circulation. Respiratory: Normal respiratory effort without tachypnea or retractions. Lungs CTAB. Good air entry to the bases with no decreased or absent breath sounds. Gastrointestinal: Bowel sounds 4 quadrants. Soft and nontender to palpation. No guarding or rigidity. No palpable masses. No distention.  Musculoskeletal: Full range of motion to all extremities. No gross deformities appreciated. Neurologic:  Normal speech and language. No gross focal neurologic deficits are appreciated.  Skin:  Skin is warm, dry and intact.  Multiple 1 cm pink areas of skin to arms and back.  Psychiatric: Mood and affect are normal. Speech and behavior are normal. Patient exhibits appropriate insight and judgement.   ____________________________________________   LABS (all labs ordered are listed,  but only abnormal results are displayed)  Labs Reviewed  GLUCOSE, CAPILLARY - Abnormal; Notable for the following components:      Result Value   Glucose-Capillary 216 (*)    All other components within normal limits  CBG MONITORING, ED   ____________________________________________  EKG   ____________________________________________  RADIOLOGY   No results found.  ____________________________________________    PROCEDURES  Procedure(s) performed:    Procedures    Medications  methylPREDNISolone sodium succinate (SOLU-MEDROL) 125 mg/2 mL injection 80 mg (80 mg Intramuscular Given 12/12/17 1442)  diphenhydrAMINE (BENADRYL) injection 25 mg (25 mg Intramuscular Given 12/12/17 1442)     ____________________________________________   INITIAL IMPRESSION / ASSESSMENT AND PLAN / ED COURSE  Pertinent labs & imaging results that were available during my care of the patient were reviewed by me and considered in my medical decision making (see chart for details).  Review of the  [place]RS was performed in accordance of the [place] prior to dispensing any controlled drugs.     Patient presented to the emergency department for evaluation of multiple bee stings yesterday.  IM Solu-Medrol and Benadryl was given.  Patient will be discharged home with prescriptions for prednisone and Benadryl. Patient is to follow up with primary care as directed. Patient is given ED precautions to return to the ED for any worsening or new symptoms.     ____________________________________________  FINAL CLINICAL IMPRESSION(S) / ED DIAGNOSES  Final diagnoses:  Bee sting reaction, accidental or unintentional, initial encounter      NEW MEDICATIONS STARTED DURING THIS VISIT:  ED Discharge Orders         Ordered    predniSONE (DELTASONE) 10 MG tablet  Daily,   Status:  Discontinued     12/12/17 1455    diphenhydrAMINE (BENADRYL) 25 mg capsule  Every 6 hours PRN,   Status:  Discontinued      12/12/17 1455    predniSONE (DELTASONE) 10 MG tablet     12/12/17 1457    diphenhydrAMINE (BENADRYL) 25 mg capsule  Every 6 hours PRN     12/12/17 1457              This chart was dictated using voice recognition software/Dragon. Despite best efforts to proofread, errors can occur which can change the meaning. Any change was purely  unintentional.     Laban Emperor, PA-C 12/12/17 1508    Earleen Newport, MD 12/13/17 [phone]

## 2017-12-12 NOTE — Progress Notes (Signed)
Subjective:  Patient ID: Michelle Warner, female   DOB: Apr 14, 1975, 42 y.o.   MRN: [id number]    Increase emotional regulation  Michelle Warner  presents with anxiety. Onset of symptoms was in the last several years with gradually improving improving. Symptoms have been occurringNot influenced by the time of the day.  Symptoms are currently rated moderate. Associated signs and symptoms include: attention difficulties, highly negative, poor social interaction and feeling overwhelmed. Financial difficulties Occupational concerns.   She reports that she has been dealing with stress. She explains that she has been worrying about finances and upcoming disability hearing in October. She explains that she is trying to stay positive and prayer for a good outcome. She notes that the other source of her anxiety is worrying about a [employer] hearing that is coming up soon. She explains that a year ago, something she eat at [facility] caused her several trips to the emergency room and cut up her throat. She explains that she is trying to eat right, cut out soda, drinking plenty of water, and trying to exercise some. She notes that she is trying to stay busy and active around the house as well. She denies suicidal and homicidal thoughts.   Therapeutic Interventions: Cognitive Behavioral therapy was utilized by the clinician focusing on anxiety and affect on normal cognition. Clinician processed with the patient regarding how she has been doing since the last session. Clinician discussed with the patient regarding the sources of her overall stress. Clinician suggested that the patient try to do some relaxation techniques and some things to distract herself from constantly worrying about things that she does not have control over. Clinician encouraged the patient to continue to pray. Clinician processed with the patient regarding how that she is glad to her that she is trying to improve her diet and get some exercise.    Return visit in 3 weeks.    Effectiveness:  Review of last therapy session (1). Progressing It is felt more time is needed for Interventions to work.   As evidenced by: Marland Kitchen Patient is fully  Other:  orientated to time and place. Patient's Appropriate into problems. Active. Thought process is  Coherent.Minimal: No identifiable suicidal ideation.  Patients presenting with no risk factors but with morbid ruminations; may be classified as minimal risk based on the severity of the depressive symptoms and None.   Homework: Stay active and practice self care.   Plan: Follow up with Julian Hy, LCSW at [facility] in three weeks or earlier if needed.

## 2017-12-14 ENCOUNTER — Other Ambulatory Visit: Payer: Self-pay | Admitting: Adult Health Nurse Practitioner

## 2017-12-14 ENCOUNTER — Other Ambulatory Visit: Payer: Self-pay | Admitting: Internal Medicine

## 2017-12-14 DIAGNOSIS — K047 Periapical abscess without sinus: Secondary | ICD-10-CM

## 2017-12-19 ENCOUNTER — Telehealth: Payer: Self-pay | Admitting: Internal Medicine

## 2017-12-19 NOTE — Telephone Encounter (Signed)
Called pt to reschedule eye appointment for DOS 01/23/18. Left message for pt to return phone call at earliest convenience.

## 2017-12-25 ENCOUNTER — Telehealth: Payer: Self-pay | Admitting: Adult Health Nurse Practitioner

## 2017-12-25 NOTE — Telephone Encounter (Signed)
Called and rescheduled 11/6 eye appointment to 11/13.

## 2017-12-28 ENCOUNTER — Other Ambulatory Visit: Payer: Self-pay | Admitting: Adult Health Nurse Practitioner

## 2017-12-28 DIAGNOSIS — J301 Allergic rhinitis due to pollen: Secondary | ICD-10-CM

## 2018-01-01 ENCOUNTER — Ambulatory Visit: Payer: 59 | Admitting: Licensed Clinical Social Worker

## 2018-01-01 DIAGNOSIS — F411 Generalized anxiety disorder: Secondary | ICD-10-CM

## 2018-01-03 NOTE — Progress Notes (Signed)
Integrated Behavioral Health Follow Up Visit  MRN: [id number] Name: LORIN HAUCK   Type of Service: [place] Interpretor:No. Interpretor Name and Language: Not applicable.  SUBJECTIVE: FANNYE MYER is a 42 y.o. female accompanied by herself. Patient was referred by Staci Acosta for symptoms of anxiety and depression. Patient reports the following symptoms/concerns: Ms. Joswick reports that her anxiety is high due due to her daughter leaving the house in the last week. She notes that she believes that her daughter is doing drugs, knows that she is hanging around the wrong people, and lost her job. She explains that she didn't raise her daughter that way and doesn't understand why she is behaving the way. She notes that her husband has a few people looking out for her daughter as well. She explains that she has been having lots of crying spells. She denies suicidal and homicidal thoughts. Duration of problem: Worsened since the passing of her father and her daughter leaving the house in the last week ; Severity of problem: moderate  OBJECTIVE: Mood: Euthymic and Affect: Appropriate Risk of harm to self or others: No plan to harm self or others  LIFE CONTEXT: Family and Social: Ms. Biscardi is worried about her daughter's welfare and her recent behaviors. School/Work: Ms. Spurgin has an open case with disability.  Self-Care: Ms. Braddy has been trying to get adequate rest, eat healthier, and try to focus on the positive things in her life. Life Changes: Ms. Manton is worried about her daughter who lost her job, is using drugs, and is hanging out with the wrong people in the last week.   GOALS ADDRESSED: Patient will: 1.  Reduce symptoms of: anxiety and stress  2.  Increase knowledge and/or ability of: healthy habits, self-management skills and stress reduction  3.  Demonstrate ability to: Increase healthy adjustment to current life  circumstances  INTERVENTIONS: Interventions utilized:  Clinician utilized cognitive behavioral therapy by focusing on the patient's anxiety and affect on normal cognition. Clinician processed with the patient regarding how she has been feeling since the last follow up session. Clinician explained that it sounds like her daughter is making poor choices. Clinician suggested that the patient reach out to her daughter letting her know that she loves her and wants her to do better. Clinician explained that if her daughter has a disability that she could always call the police and ask them to do a welfare check on her daughter. Clinician explained that chances are that she will run out of food, shelter, and money; meaning she will be back home sooner than she thinks. Clinician asked the patient how often she is practicing self care and utilizing her coping skills.  Standardized Assessments completed: Not Needed  ASSESSMENT: Patient currently experiencing psychosocial stressors due to her daughter acting out.   Patient may benefit from continuing to practice self care, utilizing her coping skills, and not dwell on things.  PLAN: 1. Follow up with behavioral health clinician on : in two weeks or earlier if needed. 2. Behavioral recommendations: Utilizing coping skills, practice self care, and not dwell on things.  3. Referral(s): [place] (In Clinic) 4. "From scale of 1-10, how likely are you to follow plan?": [place], LCSW

## 2018-01-04 ENCOUNTER — Other Ambulatory Visit: Payer: Self-pay | Admitting: Adult Health Nurse Practitioner

## 2018-01-10 ENCOUNTER — Other Ambulatory Visit: Payer: Self-pay | Admitting: Adult Health Nurse Practitioner

## 2018-01-10 DIAGNOSIS — E1140 Type 2 diabetes mellitus with diabetic neuropathy, unspecified: Secondary | ICD-10-CM

## 2018-01-15 ENCOUNTER — Ambulatory Visit: Payer: 59 | Admitting: Licensed Clinical Social Worker

## 2018-01-15 ENCOUNTER — Telehealth: Payer: Self-pay | Admitting: Pharmacist

## 2018-01-15 DIAGNOSIS — F331 Major depressive disorder, recurrent, moderate: Secondary | ICD-10-CM

## 2018-01-15 DIAGNOSIS — F411 Generalized anxiety disorder: Secondary | ICD-10-CM

## 2018-01-15 NOTE — Telephone Encounter (Signed)
01/15/2018 10:45:32 AM - Levemir Vial refill-dose change  01/15/18 Printed [employer] refill request for Levemir Vials Inject 20 units in morning & 20 units in evening #5 vials Max daily dose 40 units Dose increase.Delos Haring

## 2018-01-15 NOTE — BH Specialist Note (Signed)
Integrated Behavioral Health Follow Up Visit  MRN: [id number] Name: Michelle Warner   Type of Service: [place] Interpretor:No. Interpretor Name and Language: Not applicable.   SUBJECTIVE: Michelle Warner is a 42 y.o. female accompanied by herself. Patient was referred by Staci Acosta nurse practicioner for mental health. Patient reports the following symptoms/concerns: She explains that she has been dealing with situational stressors. She explains that her crying spells have decreased. She explains that she has been having a lot of back pain and isn't eating the best. She notes that her daughter is staying with a lady who has a bunch of kids and can't understand why she won't come back home. She notes that her anxiety is bad because her husband had to go to the er the other night and her brother is in the hospital but isn't sure what is wrong with him. She reports that the only positive is the disability hearing went well. She explains that she wants to go to church but her husband doesn't have any extra money to be able to get to church. She denies suicidal and homicidal thoughts.  Duration of problem: ; Severity of problem: moderate  OBJECTIVE: Mood: Euthymic and Affect: Appropriate Risk of harm to self or others: No plan to harm self or others  LIFE CONTEXT: Family and Social: Michelle Warner is worrying about her daughter and brother.  School/Work: Michelle Warner is awaiting a response from disability. Self-Care: Michelle Warner is trying to focus on the positive and stay busy. She has been eating a lot of halloween candy. Life Changes: Michelle Warner stress level has increased due to situational stressors.   GOALS ADDRESSED: Patient will: 1.  Reduce symptoms of: stress  2.  Increase knowledge and/or ability of: healthy habits, self-management skills and stress reduction  3.  Demonstrate ability to: Increase healthy adjustment to current life  circumstances  INTERVENTIONS: Interventions utilized:  Brief CBT was utilized by the clinician focusing on the patient's anxiety and affect on normal cognition. Clinician processed with the client regarding how she has been doing since the last session. Clinician explained that she thinks that her back pain is partially due to the amount of stress that she has experienced over the last two weeks and encouraged her to figure out some different ways to relax. Clinician explained that she thinks if there is a way that she could reach out to a church member in regards to transportation then she would be able to have another healthy outlet at church besides peer support and opt group at [facility].  Standardized Assessments completed: PHQ 9 9 and GAD 7 12  ASSESSMENT: Patient currently experiencing anxiety due to situational stressors.   Patient may benefit from continuing with therapy.  PLAN: 1. Follow up with behavioral health clinician on : three to four weeks. 2. Behavioral recommendations: Figure out a transportation option to church. 3. Referral(s): [place] (In Clinic) 4. "From scale of 1-10, how likely are you to follow plan?": [place], LCSW

## 2018-01-21 ENCOUNTER — Encounter: Payer: Medicaid Other | Admitting: Pharmacist

## 2018-01-23 ENCOUNTER — Ambulatory Visit: Payer: Medicaid Other | Admitting: Ophthalmology

## 2018-01-23 ENCOUNTER — Other Ambulatory Visit: Payer: 59

## 2018-01-24 ENCOUNTER — Other Ambulatory Visit: Payer: 59

## 2018-01-24 DIAGNOSIS — E1165 Type 2 diabetes mellitus with hyperglycemia: Secondary | ICD-10-CM

## 2018-01-25 LAB — LIPID PANEL
CHOLESTEROL TOTAL: 148 mg/dL (ref 100–199)
Chol/HDL Ratio: 3.5 ratio (ref 0.0–4.4)
HDL: 42 mg/dL (ref >39)
LDL Calculated: 54 mg/dL (ref 0–99)
Triglycerides: 260 mg/dL — ABNORMAL HIGH (ref 0–149)
VLDL CHOLESTEROL CAL: 52 mg/dL — AB (ref 5–40)

## 2018-01-25 LAB — HEMOGLOBIN A1C
Est. average glucose Bld gHb Est-mCnc: 258 mg/dL
Hgb A1c MFr Bld: 10.6 % — ABNORMAL HIGH (ref 4.8–5.6)

## 2018-01-30 ENCOUNTER — Ambulatory Visit: Payer: Medicaid Other | Admitting: Ophthalmology

## 2018-01-30 ENCOUNTER — Encounter: Payer: Self-pay | Admitting: Internal Medicine

## 2018-01-30 ENCOUNTER — Ambulatory Visit: Payer: Medicaid Other | Admitting: Internal Medicine

## 2018-01-30 VITALS — BP 116/76 | HR 86 | Temp 98.0°F | Ht 62.0 in | Wt 239.6 lb

## 2018-01-30 DIAGNOSIS — E1140 Type 2 diabetes mellitus with diabetic neuropathy, unspecified: Secondary | ICD-10-CM

## 2018-01-30 DIAGNOSIS — J301 Allergic rhinitis due to pollen: Secondary | ICD-10-CM

## 2018-01-30 DIAGNOSIS — I10 Essential (primary) hypertension: Secondary | ICD-10-CM

## 2018-01-30 DIAGNOSIS — K219 Gastro-esophageal reflux disease without esophagitis: Secondary | ICD-10-CM

## 2018-01-30 DIAGNOSIS — E785 Hyperlipidemia, unspecified: Secondary | ICD-10-CM

## 2018-01-30 MED ORDER — INSULIN DETEMIR 100 UNIT/ML ~~LOC~~ SOLN: SUBCUTANEOUS | 2 refills | Status: DC

## 2018-01-30 MED ORDER — GABAPENTIN 300 MG PO CAPS: 300.0000 mg | ORAL_CAPSULE | Freq: Every day | ORAL | 3 refills | Status: DC

## 2018-01-30 MED ORDER — LISINOPRIL 20 MG PO TABS: 20.0000 mg | ORAL_TABLET | Freq: Every day | ORAL | 3 refills | Status: DC

## 2018-01-30 MED ORDER — ROSUVASTATIN CALCIUM 10 MG PO TABS: 10.0000 mg | ORAL_TABLET | Freq: Every day | ORAL | 3 refills | Status: DC

## 2018-01-30 MED ORDER — FLUTICASONE PROPIONATE 50 MCG/ACT NA SUSP: 2.0000 | Freq: Every day | NASAL | 0 refills | Status: AC

## 2018-01-30 MED ORDER — OMEPRAZOLE 20 MG PO CPDR: 40.0000 mg | DELAYED_RELEASE_CAPSULE | Freq: Every day | ORAL | 3 refills | Status: DC

## 2018-01-30 MED ORDER — GLIPIZIDE 10 MG PO TABS: ORAL_TABLET | ORAL | 3 refills | Status: DC

## 2018-01-30 NOTE — Progress Notes (Signed)
Subjective:    Patient ID: Michelle Warner, female    DOB: 1976/01/16, 42 y.o.   MRN: [id number]  HPI  Chief Complaint  Patient presents with  . Follow-up    review labs; bruise on left hand that is painful   . Medication Refill    needs most of meds refilled    Pt reports that she has coughing spells that keep her up at night and that's why she is on inhalers.   Pt is diabetic and she checks FSBS in the morning. Pt states that her blood sugar was in the 200's this morning. Pt does not report having time's when her sugar drops below 50.    Review of Systems   Patient Active Problem List   Diagnosis Date Noted  . Diabetic polyneuropathy ([place]) 04/17/2017  . Exercise counseling 12/11/2014  . Gravida 1 12/11/2014  . Parity 1 12/11/2014  . Abnormal presence of protein in urine 12/11/2014  . Dietary counseling and surveillance 12/11/2014  . Appendicitis 12/08/2014  . Body mass index of 60 or higher 08/20/2014  . Cannot sleep 08/20/2014  . Excessive and frequent menstruation 08/20/2014  . Dysmetabolic syndrome 97/04/6376  . Obstructive sleep apnea of adult 08/20/2014  . Type II diabetes mellitus, uncontrolled ([place]) 08/20/2014  . Morbid obesity ([place]) 11/26/2007  . Allergic rhinitis, seasonal 09/04/2007  . Blood in the urine 08/16/2007  . Routine general medical examination at a health care facility 08/16/2007  . Migraine without aura and responsive to treatment 05/01/2007  . Undiagnosed cardiac murmurs 05/01/2007  . Allergic rhinitis 10/23/2006  . Gastro-esophageal reflux disease without esophagitis 09/04/2006  . Hypertension goal BP (blood pressure) < 140/90 09/04/2006  . HLD (hyperlipidemia) 09/04/2006  . Anemia, iron deficiency 09/04/2006   Allergies as of 01/30/2018   No Known Allergies     Medication List        Accurate as of 01/30/18 11:52 AM. Always use your most recent med list.          acetaminophen 500 MG tablet Commonly known as:  TYLENOL Take 1,000 mg  by mouth at bedtime. For back, leg and knee pain   albuterol 108 (90 Base) MCG/ACT inhaler Commonly known as:  PROVENTIL HFA;VENTOLIN HFA Inhale 1-2 puffs into the lungs every 6 (six) hours as needed for wheezing or shortness of breath.   albuterol (2.5 MG/3ML) 0.083% nebulizer solution Commonly known as:  PROVENTIL INHALE CONTENTS OF ONE VIAL USING NEBULIZER EVERY 6 HOURS AS NEEDED FOR WHEEZING OR SHORTNESS OF BREATH   cetirizine 10 MG tablet Commonly known as:  ZYRTEC TAKE ONE TABLET BY MOUTH EVERY DAY   diphenhydrAMINE 25 mg capsule Commonly known as:  BENADRYL Take 1 capsule (25 mg total) by mouth every 6 (six) hours as needed.   fluticasone 50 MCG/ACT nasal spray Commonly known as:  FLONASE PLACE 2 SPRAYS INTO BOTH NOSTRILS DAILY.   gabapentin 300 MG capsule Commonly known as:  NEURONTIN TAKE ONE CAPSULE BY MOUTH AT BEDTIME   glipiZIDE 10 MG tablet Commonly known as:  GLUCOTROL TAKE ONE TABLET BY MOUTH 2 TIMES A DAY BEFORE A MEAL   hydrochlorothiazide 12.5 MG capsule Commonly known as:  MICROZIDE TAKE ONE TABLET BY MOUTH EVERY DAY   ibuprofen 800 MG tablet Commonly known as:  ADVIL,MOTRIN TAKE ONE TABLET BY MOUTH EVERY 8 HOURS AS NEEDED   insulin detemir 100 UNIT/ML injection Commonly known as:  LEVEMIR Take 20 units in the morning and 20 units at night  lisinopril 20 MG tablet Commonly known as:  PRINIVIL,ZESTRIL Take 1 tablet (20 mg total) by mouth daily.   naproxen 250 MG tablet Commonly known as:  NAPROSYN Take 1 tablet (250 mg total) by mouth 2 (two) times daily with a meal.   nitrofurantoin (macrocrystal-monohydrate) 100 MG capsule Commonly known as:  MACROBID Take 1 capsule (100 mg total) by mouth 2 (two) times daily.   omeprazole 20 MG capsule Commonly known as:  PRILOSEC Take 40 mg by mouth daily. Takes 20-40mg  daily   predniSONE 10 MG tablet Commonly known as:  DELTASONE Take 6 tablets on day 1, take 5 tablets on day 2, take 4 tablets on day  3, take 3 tablets on day 4, take 2 tablets on day 5, take 1 tablet on day 6   rosuvastatin 10 MG tablet Commonly known as:  CRESTOR Take 1 tablet (10 mg total) by mouth daily.   traZODone 50 MG tablet Commonly known as:  DESYREL Take 1 tablet (50 mg total) by mouth at bedtime as needed.   Vitamin D (Ergocalciferol) 1.25 MG (50000 UT) Caps capsule Commonly known as:  DRISDOL Take 1 capsule (50,000 Units total) by mouth every 7 (seven) days.         Objective:   Physical Exam  BP 116/76   Pulse 86   Temp 98 F (36.7 C) (Oral)   Ht 5\' 2"  (1.575 m)   Wt 239 lb 9.6 oz (108.7 kg)   BMI 43.82 kg/m     Assessment & Plan:   1. Allergic rhinitis due to pollen Refill Today: - fluticasone (FLONASE) 50 MCG/ACT nasal spray; Place 2 sprays into both nostrils daily.  Dispense: 16 g; Refill: 0  2. Type 2 diabetes mellitus with diabetic neuropathy, unspecified whether long term insulin use (HCC) Uncontrolled.   Refill Today: - gabapentin (NEURONTIN) 300 MG capsule; Take 1 capsule (300 mg total) by mouth at bedtime.  Dispense: 90 capsule; Refill: 3 - glipiZIDE (GLUCOTROL) 10 MG tablet; TAKE ONE TABLET BY MOUTH 2 TIMES A DAY BEFORE A MEAL  Dispense: 120 tablet; Refill: 3 - insulin detemir (LEVEMIR) 100 UNIT/ML injection; Take 20 units in the morning and 20 units at night  Dispense: 10 mL; Refill: 2  Check in 3 Months: - CBC; Future - Comprehensive metabolic panel; Future - Lipid panel; Future - Hemoglobin A1c; Future - TSH; Future - Urinalysis; Future - Microalbumin / creatinine urine ratio; Future  3. Hypertension goal BP (blood pressure) < 140/90 Controlled. Refill Today: - lisinopril (PRINIVIL,ZESTRIL) 20 MG tablet; Take 1 tablet (20 mg total) by mouth daily.  Dispense: 90 tablet; Refill: 3  4. Hyperlipidemia, unspecified hyperlipidemia type Refill Today: - rosuvastatin (CRESTOR) 10 MG tablet; Take 1 tablet (10 mg total) by mouth daily.  Dispense: 90 tablet; Refill: 3  5.  Gastro-esophageal reflux disease without esophagitis Refill Today: - omeprazole (PRILOSEC) 20 MG capsule; Take 2 capsules (40 mg total) by mouth daily. Takes 20-40mg  daily  Dispense: 90 capsule; Refill: 3

## 2018-02-01 ENCOUNTER — Telehealth: Payer: Self-pay | Admitting: Pharmacist

## 2018-02-05 ENCOUNTER — Ambulatory Visit: Payer: 59 | Admitting: Licensed Clinical Social Worker

## 2018-02-07 ENCOUNTER — Other Ambulatory Visit: Payer: Self-pay | Admitting: Adult Health Nurse Practitioner

## 2018-02-12 ENCOUNTER — Telehealth: Payer: Self-pay | Admitting: Pharmacist

## 2018-02-12 NOTE — Telephone Encounter (Signed)
02/12/2018 12:15:46 PM - Ventolin HFA refill  02/12/18 Placed refill online with [facility] for Ventolin HFA, to release 02/26/18, order# [id number].Delos Haring

## 2018-02-13 ENCOUNTER — Telehealth: Payer: Self-pay | Admitting: Pharmacy Technician

## 2018-02-13 NOTE — Telephone Encounter (Signed)
Patient has Pharmacist, community.  No longer meets [facility]'s eligibility criteria.  [facility]

## 2018-03-05 ENCOUNTER — Telehealth: Payer: Self-pay | Admitting: Licensed Clinical Social Worker

## 2018-03-05 NOTE — Telephone Encounter (Signed)
Clinician left patient a voicemail with call back information. She explained that due to her not being an employee at her husband's work that the could remove her from his insurance policy so she could continue to be a patient at [facility].

## 2018-04-24 ENCOUNTER — Other Ambulatory Visit: Payer: 59

## 2018-04-26 ENCOUNTER — Emergency Department
Admission: EM | Admit: 2018-04-26 | Discharge: 2018-04-26 | Disposition: A | Discharge status: Home / Self Care | Payer: 59 | Attending: Emergency Medicine | Admitting: Emergency Medicine

## 2018-04-26 ENCOUNTER — Other Ambulatory Visit: Payer: Self-pay

## 2018-04-26 DIAGNOSIS — Z87891 Personal history of nicotine dependence: Secondary | ICD-10-CM | POA: Diagnosis not present

## 2018-04-26 DIAGNOSIS — E1140 Type 2 diabetes mellitus with diabetic neuropathy, unspecified: Secondary | ICD-10-CM | POA: Diagnosis not present

## 2018-04-26 DIAGNOSIS — I10 Essential (primary) hypertension: Secondary | ICD-10-CM | POA: Diagnosis not present

## 2018-04-26 DIAGNOSIS — Z79899 Other long term (current) drug therapy: Secondary | ICD-10-CM | POA: Diagnosis not present

## 2018-04-26 DIAGNOSIS — L03011 Cellulitis of right finger: Secondary | ICD-10-CM | POA: Insufficient documentation

## 2018-04-26 DIAGNOSIS — R2231 Localized swelling, mass and lump, right upper limb: Secondary | ICD-10-CM | POA: Insufficient documentation

## 2018-04-26 DIAGNOSIS — Z794 Long term (current) use of insulin: Secondary | ICD-10-CM | POA: Insufficient documentation

## 2018-04-26 DIAGNOSIS — M79641 Pain in right hand: Secondary | ICD-10-CM | POA: Diagnosis present

## 2018-04-26 MED ORDER — SULFAMETHOXAZOLE-TRIMETHOPRIM 800-160 MG PO TABS: 1.0000 | ORAL_TABLET | Freq: Two times a day (BID) | ORAL | 0 refills | Status: DC

## 2018-04-26 MED ORDER — IBUPROFEN 600 MG PO TABS: 600.0000 mg | ORAL_TABLET | Freq: Three times a day (TID) | ORAL | 0 refills | Status: DC | PRN

## 2018-04-26 MED ORDER — SULFAMETHOXAZOLE-TRIMETHOPRIM 800-160 MG PO TABS
1.0000 | ORAL_TABLET | Freq: Once | ORAL | Status: AC
  Administered 2018-04-26: 1 via ORAL
  Filled 2018-04-26: qty 1

## 2018-04-26 MED ORDER — LIDOCAINE-EPINEPHRINE-TETRACAINE (LET) SOLUTION
3.0000 mL | Freq: Once | NASAL | Status: AC
  Administered 2018-04-26: 3 mL via TOPICAL
  Filled 2018-04-26: qty 3

## 2018-04-26 MED ORDER — OXYCODONE-ACETAMINOPHEN 7.5-325 MG PO TABS: 1.0000 | ORAL_TABLET | Freq: Four times a day (QID) | ORAL | 0 refills | Status: DC | PRN

## 2018-04-26 MED ORDER — OXYCODONE-ACETAMINOPHEN 5-325 MG PO TABS
1.0000 | ORAL_TABLET | Freq: Once | ORAL | Status: AC
  Administered 2018-04-26: 1 via ORAL
  Filled 2018-04-26: qty 1

## 2018-04-26 MED ORDER — NAPROXEN 500 MG PO TABS
500.0000 mg | ORAL_TABLET | Freq: Once | ORAL | Status: AC
  Administered 2018-04-26: 500 mg via ORAL
  Filled 2018-04-26: qty 1

## 2018-04-26 NOTE — ED Notes (Signed)
See triage note  Presents with pain and swelling to right middle finger  States she had a hang nail couple of days ago before swelling started

## 2018-04-26 NOTE — ED Triage Notes (Signed)
Pt c/o right 3rd finger pain and swelling of the cuticle for the past 2 days.

## 2018-04-26 NOTE — ED Provider Notes (Signed)
[facility] Emergency Department Provider Note   ____________________________________________   None    (approximate)  I have reviewed the triage vital signs and the nursing notes.   HISTORY  Chief Complaint Hand Pain    HPI Michelle Warner is a 43 y.o. female patient presents with pain and swelling at the cuticle area of the third digit right hand.  Patient state increased pain and swelling for 2 days.  Patient rates pain as a 6/10.  Patient described the pain is "pressure/aching".  No palliative measure for complaint.    Past Medical History:  Diagnosis Date  . Anxiety   . Appendicitis   . Depression   . Diabetes mellitus without complication ([place])   . Heart murmur   . Hypercholesterolemia   . Hypertension     Patient Active Problem List   Diagnosis Date Noted  . Diabetic polyneuropathy ([place]) 04/17/2017  . Exercise counseling 12/11/2014  . Gravida 1 12/11/2014  . Parity 1 12/11/2014  . Abnormal presence of protein in urine 12/11/2014  . Dietary counseling and surveillance 12/11/2014  . Appendicitis 12/08/2014  . Body mass index of 60 or higher 08/20/2014  . Cannot sleep 08/20/2014  . Excessive and frequent menstruation 08/20/2014  . Dysmetabolic syndrome 09/17/1599  . Obstructive sleep apnea of adult 08/20/2014  . Type II diabetes mellitus, uncontrolled ([place]) 08/20/2014  . Morbid obesity ([place]) 11/26/2007  . Allergic rhinitis, seasonal 09/04/2007  . Blood in the urine 08/16/2007  . Routine general medical examination at a health care facility 08/16/2007  . Migraine without aura and responsive to treatment 05/01/2007  . Undiagnosed cardiac murmurs 05/01/2007  . Allergic rhinitis 10/23/2006  . Gastro-esophageal reflux disease without esophagitis 09/04/2006  . Hypertension goal BP (blood pressure) < 140/90 09/04/2006  . HLD (hyperlipidemia) 09/04/2006  . Anemia, iron deficiency 09/04/2006    Past Surgical History:  Procedure  Laterality Date  . APPENDECTOMY    . CESAREAN SECTION    . LAPAROSCOPIC APPENDECTOMY N/A 12/08/2014   Procedure: APPENDECTOMY LAPAROSCOPIC;  Surgeon: Sherri Rad, MD;  Location: [facility] ORS;  Service: General;  Laterality: N/A;  . TONSILLECTOMY      Prior to Admission medications   Medication Sig Start Date End Date Taking? Authorizing Provider  acetaminophen (TYLENOL) 500 MG tablet Take 1,000 mg by mouth at bedtime. For back, leg and knee pain    [provider]  albuterol (PROVENTIL HFA;VENTOLIN HFA) 108 (90 Base) MCG/ACT inhaler Inhale 1-2 puffs into the lungs every 6 (six) hours as needed for wheezing or shortness of breath. 04/12/17   Doles-Johnson, Teah, NP  albuterol (PROVENTIL) (2.5 MG/3ML) 0.083% nebulizer solution INHALE CONTENTS OF ONE VIAL USING NEBULIZER EVERY 6 HOURS AS NEEDED FOR WHEEZING OR SHORTNESS OF BREATH 06/25/17   Doles-Johnson, Teah, NP  cetirizine (ZYRTEC) 10 MG tablet TAKE ONE TABLET BY MOUTH EVERY DAY 02/07/18   Doles-Johnson, Teah, NP  diphenhydrAMINE (BENADRYL) 25 mg capsule Take 1 capsule (25 mg total) by mouth every 6 (six) hours as needed. 12/12/17 12/12/18  Laban Emperor, PA-C  fluticasone (FLONASE) 50 MCG/ACT nasal spray Place 2 sprays into both nostrils daily. 01/30/18   Tawni Millers, MD  gabapentin (NEURONTIN) 300 MG capsule Take 1 capsule (300 mg total) by mouth at bedtime. 01/30/18   Tawni Millers, MD  glipiZIDE (GLUCOTROL) 10 MG tablet TAKE ONE TABLET BY MOUTH 2 TIMES A DAY BEFORE A MEAL 01/30/18   Tawni Millers, MD  hydrochlorothiazide (MICROZIDE) 12.5 MG capsule TAKE  ONE TABLET BY MOUTH EVERY DAY 07/27/17   Doles-Johnson, Teah, NP  ibuprofen (ADVIL,MOTRIN) 600 MG tablet Take 1 tablet (600 mg total) by mouth every 8 (eight) hours as needed. 04/26/18   Sable Feil, PA-C  ibuprofen (ADVIL,MOTRIN) 800 MG tablet TAKE ONE TABLET BY MOUTH EVERY 8 HOURS AS NEEDED 12/14/17   Doles-Johnson, Teah, NP  insulin detemir (LEVEMIR) 100 UNIT/ML injection Take 20 units  in the morning and 20 units at night 01/30/18   Tawni Millers, MD  lisinopril (PRINIVIL,ZESTRIL) 20 MG tablet Take 1 tablet (20 mg total) by mouth daily. 01/30/18   Tawni Millers, MD  naproxen (NAPROSYN) 250 MG tablet Take 1 tablet (250 mg total) by mouth 2 (two) times daily with a meal. Patient taking differently: Take 250 mg by mouth 2 (two) times daily with a meal. Taking 1 tablet twice daily due to pain 10/11/17   Jerrol Banana., MD  nitrofurantoin, macrocrystal-monohydrate, (MACROBID) 100 MG capsule Take 1 capsule (100 mg total) by mouth 2 (two) times daily. 10/11/17   Jerrol Banana., MD  omeprazole (PRILOSEC) 20 MG capsule Take 2 capsules (40 mg total) by mouth daily. Takes 20-40mg  daily 01/30/18   Tawni Millers, MD  oxyCODONE-acetaminophen (PERCOCET) 7.5-325 MG tablet Take 1 tablet by mouth every 6 (six) hours as needed for severe pain. 04/26/18   Sable Feil, PA-C  predniSONE (DELTASONE) 10 MG tablet Take 6 tablets on day 1, take 5 tablets on day 2, take 4 tablets on day 3, take 3 tablets on day 4, take 2 tablets on day 5, take 1 tablet on day 6 12/12/17   Laban Emperor, PA-C  rosuvastatin (CRESTOR) 10 MG tablet Take 1 tablet (10 mg total) by mouth daily. 01/30/18   Tawni Millers, MD  sulfamethoxazole-trimethoprim (BACTRIM DS,SEPTRA DS) 800-160 MG tablet Take 1 tablet by mouth 2 (two) times daily. 04/26/18   Sable Feil, PA-C  traZODone (DESYREL) 50 MG tablet Take 1 tablet (50 mg total) by mouth at bedtime as needed. Patient taking differently: Take 50 mg by mouth at bedtime as needed. Prescribed 1 1/2 - 3 tablets at bedtime. Take 2 tablets. 01/11/15   [name], MD  Vitamin D, Ergocalciferol, (DRISDOL) 50000 units CAPS capsule Take 1 capsule (50,000 Units total) by mouth every 7 (seven) days. 09/06/17   Erlene Quan, NP    Allergies Patient has no known allergies.  Family History  Problem Relation Age of Onset  . Hypertension Mother   . Heart  disease Mother   . Hypertension Father   . Stroke Father   . Heart disease Father     Social History Social History   Tobacco Use  . Smoking status: Former Smoker    Years: 10.00    Types: Cigarettes    Last attempt to quit: 1996    Years since quitting: 24.1  . Smokeless tobacco: Former Systems developer    Types: Snuff  Substance Use Topics  . Alcohol use: No  . Drug use: No    Review of Systems  Constitutional: No fever/chills Eyes: No visual changes. ENT: No sore throat. Cardiovascular: Denies chest pain. Respiratory: Denies shortness of breath. Gastrointestinal: No abdominal pain.  No nausea, no vomiting.  No diarrhea.  No constipation. Genitourinary: Negative for dysuria. Musculoskeletal: Negative for back pain. Skin: Negative for rash. Neurological: Negative for headaches, focal weakness or numbness. Psychiatric:  Anxiety and depression Endocrine:  Diabetes, hyperlipidemia, and hypertension. ____________________________________________   PHYSICAL  EXAM:  VITAL SIGNS: ED Triage Vitals  Enc Vitals Group     BP 04/26/18 0823 (!) 147/79     Pulse Rate 04/26/18 0823 94     Resp 04/26/18 0823 20     Temp 04/26/18 0823 97.9 F (36.6 C)     Temp Source 04/26/18 0823 Oral     SpO2 04/26/18 0823 95 %     Weight --      Height --      Head Circumference --      Peak Flow --      Pain Score 04/26/18 0820 6     Pain Loc --      Pain Edu? --      Excl. in [place]? --    Constitutional: Alert and oriented. Well appearing and in no acute distress. Cardiovascular: Normal rate, regular rhythm. Grossly normal heart sounds.  Good peripheral circulation. Respiratory: Normal respiratory effort.  No retractions. Lungs CTAB. Skin: Edema and erythema cuticle area of third digit right hand.Marland Kitchen Psychiatric: Mood and affect are normal. Speech and behavior are normal.  ____________________________________________   LABS (all labs ordered are listed, but only abnormal results are  displayed)  Labs Reviewed - No data to display ____________________________________________  EKG   ____________________________________________  RADIOLOGY  ED MD interpretation:    Official radiology report(s): No results found.  ____________________________________________   PROCEDURES  Procedure(s) performed: None  .Marland KitchenIncision and Drainage Date/Time: 04/26/2018 9:50 AM Performed by: Sable Feil, PA-C Authorized by: Sable Feil, PA-C   Consent:    Consent obtained:  Verbal   Consent given by:  Parent   Risks discussed:  Bleeding, incomplete drainage, pain and infection Location:    Type:  Abscess   Location:  Upper extremity   Upper extremity location:  Finger   Finger location:  R long finger Pre-procedure details:    Skin preparation:  Betadine Anesthesia (see MAR for exact dosages):    Anesthesia method:  Topical application   Topical anesthetic:  LET Procedure type:    Complexity:  Simple Procedure details:    Needle aspiration: no     Incision types:  Single straight   Scalpel blade:  11   Wound management:  Probed and deloculated   Drainage:  Purulent   Drainage amount:  Scant   Wound treatment:  Wound left open Post-procedure details:    Patient tolerance of procedure:  Tolerated well, no immediate complications    Critical Care performed: No  ____________________________________________   INITIAL IMPRESSION / ASSESSMENT AND PLAN / ED COURSE  As part of my medical decision making, I reviewed the following data within the [place]     Right finger pain secondary to Paronychia.  Patient given discharge care instruction advised take medication as directed.  [facility]     ____________________________________________   FINAL CLINICAL IMPRESSION(S) / ED DIAGNOSES  Final diagnoses:  Paronychia of finger of right hand     ED Discharge Orders         Ordered     sulfamethoxazole-trimethoprim (BACTRIM DS,SEPTRA DS) 800-160 MG tablet  2 times daily     04/26/18 0943    oxyCODONE-acetaminophen (PERCOCET) 7.5-325 MG tablet  Every 6 hours PRN     04/26/18 0943    ibuprofen (ADVIL,MOTRIN) 600 MG tablet  Every 8 hours PRN     04/26/18 0943           Note:  This document was  prepared using Systems analyst and may include unintentional dictation errors.    Sable Feil, PA-C 04/26/18 3414    Lavonia Drafts, MD 04/26/18 1004

## 2018-04-26 NOTE — Discharge Instructions (Signed)
Advised [employer]pson salt soaks for 10 minutes daily for 2 to 3 days.

## 2018-04-30 ENCOUNTER — Ambulatory Visit: Payer: 59 | Admitting: Gerontology

## 2018-05-07 ENCOUNTER — Telehealth: Payer: Self-pay

## 2018-05-07 NOTE — Telephone Encounter (Signed)
Left voicemail for patient regarding 945am appt at [facility] on 05/08/2018. We will need to cancel this appt. Unable to see patient at this time. It looks as if her [employer] is still active as of 05/04/2018.

## 2018-05-08 ENCOUNTER — Ambulatory Visit: Payer: 59 | Admitting: Gerontology

## 2018-07-24 DIAGNOSIS — F419 Anxiety disorder, unspecified: Secondary | ICD-10-CM | POA: Insufficient documentation

## 2018-07-24 DIAGNOSIS — E1142 Type 2 diabetes mellitus with diabetic polyneuropathy: Secondary | ICD-10-CM | POA: Insufficient documentation

## 2018-07-24 DIAGNOSIS — T7840XA Allergy, unspecified, initial encounter: Secondary | ICD-10-CM | POA: Insufficient documentation

## 2018-07-24 DIAGNOSIS — E559 Vitamin D deficiency, unspecified: Secondary | ICD-10-CM | POA: Insufficient documentation

## 2018-07-24 DIAGNOSIS — J4530 Mild persistent asthma, uncomplicated: Secondary | ICD-10-CM | POA: Insufficient documentation

## 2018-08-06 DIAGNOSIS — R0789 Other chest pain: Secondary | ICD-10-CM | POA: Insufficient documentation

## 2018-08-06 DIAGNOSIS — B3731 Acute candidiasis of vulva and vagina: Secondary | ICD-10-CM | POA: Insufficient documentation

## 2018-08-06 DIAGNOSIS — B373 Candidiasis of vulva and vagina: Secondary | ICD-10-CM | POA: Insufficient documentation

## 2018-09-19 DIAGNOSIS — J029 Acute pharyngitis, unspecified: Secondary | ICD-10-CM | POA: Insufficient documentation

## 2018-09-19 DIAGNOSIS — K120 Recurrent oral aphthae: Secondary | ICD-10-CM | POA: Insufficient documentation

## 2018-10-01 ENCOUNTER — Encounter: Payer: Self-pay | Admitting: Emergency Medicine

## 2018-10-01 ENCOUNTER — Other Ambulatory Visit: Payer: Self-pay

## 2018-10-01 ENCOUNTER — Emergency Department: Payer: 59

## 2018-10-01 ENCOUNTER — Emergency Department
Admission: EM | Admit: 2018-10-01 | Discharge: 2018-10-01 | Disposition: A | Discharge status: Home / Self Care | Payer: 59 | Attending: Student in an Organized Health Care Education/Training Program | Admitting: Student in an Organized Health Care Education/Training Program

## 2018-10-01 DIAGNOSIS — E119 Type 2 diabetes mellitus without complications: Secondary | ICD-10-CM | POA: Diagnosis not present

## 2018-10-01 DIAGNOSIS — R1011 Right upper quadrant pain: Secondary | ICD-10-CM | POA: Diagnosis not present

## 2018-10-01 DIAGNOSIS — R079 Chest pain, unspecified: Secondary | ICD-10-CM | POA: Insufficient documentation

## 2018-10-01 DIAGNOSIS — Z79899 Other long term (current) drug therapy: Secondary | ICD-10-CM | POA: Insufficient documentation

## 2018-10-01 DIAGNOSIS — Z87891 Personal history of nicotine dependence: Secondary | ICD-10-CM | POA: Insufficient documentation

## 2018-10-01 DIAGNOSIS — Z794 Long term (current) use of insulin: Secondary | ICD-10-CM | POA: Diagnosis not present

## 2018-10-01 DIAGNOSIS — K219 Gastro-esophageal reflux disease without esophagitis: Secondary | ICD-10-CM

## 2018-10-01 DIAGNOSIS — I10 Essential (primary) hypertension: Secondary | ICD-10-CM | POA: Insufficient documentation

## 2018-10-01 LAB — CBC WITH DIFFERENTIAL/PLATELET
Abs Immature Granulocytes: 0.05 10*3/uL (ref 0.00–0.07)
Basophils Absolute: 0 10*3/uL (ref 0.0–0.1)
Basophils Relative: 0 %
Eosinophils Absolute: 0.1 10*3/uL (ref 0.0–0.5)
Eosinophils Relative: 1 %
HCT: 40 % (ref 36.0–46.0)
Hemoglobin: 13.6 g/dL (ref 12.0–15.0)
Immature Granulocytes: 0 %
Lymphocytes Relative: 28 %
Lymphs Abs: 3.2 10*3/uL (ref 0.7–4.0)
MCH: 29.3 pg (ref 26.0–34.0)
MCHC: 34 g/dL (ref 30.0–36.0)
MCV: 86.2 fL (ref 80.0–100.0)
Monocytes Absolute: 0.6 10*3/uL (ref 0.1–1.0)
Monocytes Relative: 5 %
Neutro Abs: 7.5 10*3/uL (ref 1.7–7.7)
Neutrophils Relative %: 66 %
Platelets: 424 10*3/uL — ABNORMAL HIGH (ref 150–400)
RBC: 4.64 MIL/uL (ref 3.87–5.11)
RDW: 11.6 % (ref 11.5–15.5)
WBC: 11.4 10*3/uL — ABNORMAL HIGH (ref 4.0–10.5)
nRBC: 0 % (ref 0.0–0.2)

## 2018-10-01 LAB — COMPREHENSIVE METABOLIC PANEL
ALT: 18 U/L (ref 0–44)
AST: 13 U/L — ABNORMAL LOW (ref 15–41)
Albumin: 3.9 g/dL (ref 3.5–5.0)
Alkaline Phosphatase: 104 U/L (ref 38–126)
Anion gap: 11 (ref 5–15)
BUN: 10 mg/dL (ref 6–20)
CO2: 27 mmol/L (ref 22–32)
Calcium: 9.4 mg/dL (ref 8.9–10.3)
Chloride: 95 mmol/L — ABNORMAL LOW (ref 98–111)
Creatinine, Ser: 0.66 mg/dL (ref 0.44–1.00)
GFR calc Af Amer: >60 mL/min (ref >60)
GFR calc non Af Amer: >60 mL/min (ref >60)
Glucose, Bld: 366 mg/dL — ABNORMAL HIGH (ref 70–99)
Potassium: 3.8 mmol/L (ref 3.5–5.1)
Sodium: 133 mmol/L — ABNORMAL LOW (ref 135–145)
Total Bilirubin: 0.5 mg/dL (ref 0.3–1.2)
Total Protein: 8.1 g/dL (ref 6.5–8.1)

## 2018-10-01 LAB — URINALYSIS, COMPLETE (UACMP) WITH MICROSCOPIC
Bacteria, UA: NONE SEEN
Bilirubin Urine: NEGATIVE
Glucose, UA: >=500 mg/dL — AB
Hgb urine dipstick: NEGATIVE
Ketones, ur: NEGATIVE mg/dL
Nitrite: NEGATIVE
Protein, ur: NEGATIVE mg/dL
Specific Gravity, Urine: 1.033 — ABNORMAL HIGH (ref 1.005–1.030)
pH: 6 (ref 5.0–8.0)

## 2018-10-01 LAB — LIPASE, BLOOD: Lipase: 28 U/L (ref 11–51)

## 2018-10-01 LAB — GLUCOSE, CAPILLARY: Glucose-Capillary: 283 mg/dL — ABNORMAL HIGH (ref 70–99)

## 2018-10-01 LAB — TROPONIN I (HIGH SENSITIVITY)
Troponin I (High Sensitivity): 3 ng/L (ref <18)
Troponin I (High Sensitivity): 4 ng/L (ref <18)

## 2018-10-01 MED ORDER — SUCRALFATE 1 G PO TABS: 1.0000 g | ORAL_TABLET | Freq: Four times a day (QID) | ORAL | 1 refills | Status: DC

## 2018-10-01 MED ORDER — OXYCODONE-ACETAMINOPHEN 5-325 MG PO TABS
2.0000 | ORAL_TABLET | Freq: Once | ORAL | Status: AC
  Administered 2018-10-01: 09:00:00 2 via ORAL
  Filled 2018-10-01: qty 2

## 2018-10-01 MED ORDER — OMEPRAZOLE 40 MG PO CPDR: 40.0000 mg | DELAYED_RELEASE_CAPSULE | Freq: Two times a day (BID) | ORAL | 1 refills | Status: DC

## 2018-10-01 MED ORDER — SUCRALFATE 1 G PO TABS: 1.0000 g | ORAL_TABLET | Freq: Four times a day (QID) | ORAL | 0 refills | Status: DC | PRN

## 2018-10-01 MED ORDER — LIDOCAINE VISCOUS HCL 2 % MT SOLN
15.0000 mL | Freq: Once | OROMUCOSAL | Status: AC
  Administered 2018-10-01: 06:00:00 15 mL via ORAL
  Filled 2018-10-01: qty 15

## 2018-10-01 MED ORDER — ALUM & MAG HYDROXIDE-SIMETH 200-200-20 MG/5ML PO SUSP
30.0000 mL | Freq: Once | ORAL | Status: AC
  Administered 2018-10-01: 06:00:00 30 mL via ORAL
  Filled 2018-10-01: qty 30

## 2018-10-01 MED ORDER — TRAMADOL HCL 50 MG PO TABS: 50.0000 mg | ORAL_TABLET | Freq: Four times a day (QID) | ORAL | 0 refills | Status: AC | PRN

## 2018-10-01 NOTE — ED Provider Notes (Signed)
[facility] Emergency Department Provider Note    First MD Initiated Contact with Patient 10/01/18 [phone]     (approximate)  I have reviewed the triage vital signs and the nursing notes.   HISTORY  Chief Complaint Chest Pain    HPI Michelle Warner is a 43 y.o. female below listed past medical history presents to the ER for evaluation of epigastric discomfort as well as right upper quadrant pain that started overnight.  Has not been able to get any sleep due to discomfort.  Does not have any fevers but has had some nausea.  No vomiting.  No diarrhea.  Denies any history of abdominal surgery.  Does have a history of diabetes and takes insulin.  States that chest pain is nonradiating.  Is mild to moderate in severity.    Past Medical History:  Diagnosis Date  . Anxiety   . Appendicitis   . Depression   . Diabetes mellitus without complication ([place])   . Heart murmur   . Hypercholesterolemia   . Hypertension    Family History  Problem Relation Age of Onset  . Hypertension Mother   . Heart disease Mother   . Hypertension Father   . Stroke Father   . Heart disease Father    Past Surgical History:  Procedure Laterality Date  . APPENDECTOMY    . CESAREAN SECTION    . LAPAROSCOPIC APPENDECTOMY N/A 12/08/2014   Procedure: APPENDECTOMY LAPAROSCOPIC;  Surgeon: Sherri Rad, MD;  Location: [facility] ORS;  Service: General;  Laterality: N/A;  . TONSILLECTOMY     Patient Active Problem List   Diagnosis Date Noted  . Diabetic polyneuropathy ([place]) 04/17/2017  . Exercise counseling 12/11/2014  . Gravida 1 12/11/2014  . Parity 1 12/11/2014  . Abnormal presence of protein in urine 12/11/2014  . Dietary counseling and surveillance 12/11/2014  . Appendicitis 12/08/2014  . Body mass index of 60 or higher 08/20/2014  . Cannot sleep 08/20/2014  . Excessive and frequent menstruation 08/20/2014  . Dysmetabolic syndrome 18/29/9371  . Obstructive sleep apnea of adult  08/20/2014  . Type II diabetes mellitus, uncontrolled ([place]) 08/20/2014  . Morbid obesity ([place]) 11/26/2007  . Allergic rhinitis, seasonal 09/04/2007  . Blood in the urine 08/16/2007  . Routine general medical examination at a health care facility 08/16/2007  . Migraine without aura and responsive to treatment 05/01/2007  . Undiagnosed cardiac murmurs 05/01/2007  . Allergic rhinitis 10/23/2006  . Gastro-esophageal reflux disease without esophagitis 09/04/2006  . Hypertension goal BP (blood pressure) < 140/90 09/04/2006  . HLD (hyperlipidemia) 09/04/2006  . Anemia, iron deficiency 09/04/2006      Prior to Admission medications   Medication Sig Start Date End Date Taking? Authorizing Provider  acetaminophen (TYLENOL) 500 MG tablet Take 1,000 mg by mouth at bedtime. For back, leg and knee pain    [provider]  albuterol (PROVENTIL HFA;VENTOLIN HFA) 108 (90 Base) MCG/ACT inhaler Inhale 1-2 puffs into the lungs every 6 (six) hours as needed for wheezing or shortness of breath. 04/12/17   Doles-Johnson, Teah, NP  albuterol (PROVENTIL) (2.5 MG/3ML) 0.083% nebulizer solution INHALE CONTENTS OF ONE VIAL USING NEBULIZER EVERY 6 HOURS AS NEEDED FOR WHEEZING OR SHORTNESS OF BREATH 06/25/17   Doles-Johnson, Teah, NP  cetirizine (ZYRTEC) 10 MG tablet TAKE ONE TABLET BY MOUTH EVERY DAY 02/07/18   Doles-Johnson, Teah, NP  diphenhydrAMINE (BENADRYL) 25 mg capsule Take 1 capsule (25 mg total) by mouth every 6 (six) hours as needed. 12/12/17  12/12/18  Laban Emperor, PA-C  fluticasone (FLONASE) 50 MCG/ACT nasal spray Place 2 sprays into both nostrils daily. 01/30/18   Tawni Millers, MD  gabapentin (NEURONTIN) 300 MG capsule Take 1 capsule (300 mg total) by mouth at bedtime. 01/30/18   Tawni Millers, MD  glipiZIDE (GLUCOTROL) 10 MG tablet TAKE ONE TABLET BY MOUTH 2 TIMES A DAY BEFORE A MEAL 01/30/18   Tawni Millers, MD  hydrochlorothiazide (MICROZIDE) 12.5 MG capsule TAKE ONE TABLET BY MOUTH EVERY DAY  07/27/17   Doles-Johnson, Teah, NP  ibuprofen (ADVIL,MOTRIN) 600 MG tablet Take 1 tablet (600 mg total) by mouth every 8 (eight) hours as needed. 04/26/18   Sable Feil, PA-C  ibuprofen (ADVIL,MOTRIN) 800 MG tablet TAKE ONE TABLET BY MOUTH EVERY 8 HOURS AS NEEDED 12/14/17   Doles-Johnson, Teah, NP  insulin detemir (LEVEMIR) 100 UNIT/ML injection Take 20 units in the morning and 20 units at night 01/30/18   Tawni Millers, MD  lisinopril (PRINIVIL,ZESTRIL) 20 MG tablet Take 1 tablet (20 mg total) by mouth daily. 01/30/18   Tawni Millers, MD  naproxen (NAPROSYN) 250 MG tablet Take 1 tablet (250 mg total) by mouth 2 (two) times daily with a meal. Patient taking differently: Take 250 mg by mouth 2 (two) times daily with a meal. Taking 1 tablet twice daily due to pain 10/11/17   Jerrol Banana., MD  nitrofurantoin, macrocrystal-monohydrate, (MACROBID) 100 MG capsule Take 1 capsule (100 mg total) by mouth 2 (two) times daily. 10/11/17   Jerrol Banana., MD  omeprazole (PRILOSEC) 20 MG capsule Take 2 capsules (40 mg total) by mouth daily. Takes 20-40mg  daily 01/30/18   Tawni Millers, MD  oxyCODONE-acetaminophen (PERCOCET) 7.5-325 MG tablet Take 1 tablet by mouth every 6 (six) hours as needed for severe pain. 04/26/18   Sable Feil, PA-C  predniSONE (DELTASONE) 10 MG tablet Take 6 tablets on day 1, take 5 tablets on day 2, take 4 tablets on day 3, take 3 tablets on day 4, take 2 tablets on day 5, take 1 tablet on day 6 12/12/17   Laban Emperor, PA-C  rosuvastatin (CRESTOR) 10 MG tablet Take 1 tablet (10 mg total) by mouth daily. 01/30/18   Tawni Millers, MD  sulfamethoxazole-trimethoprim (BACTRIM DS,SEPTRA DS) 800-160 MG tablet Take 1 tablet by mouth 2 (two) times daily. 04/26/18   Sable Feil, PA-C  traZODone (DESYREL) 50 MG tablet Take 1 tablet (50 mg total) by mouth at bedtime as needed. Patient taking differently: Take 50 mg by mouth at bedtime as needed. Prescribed 1 1/2 - 3 tablets at  bedtime. Take 2 tablets. 01/11/15   [name], MD  Vitamin D, Ergocalciferol, (DRISDOL) 50000 units CAPS capsule Take 1 capsule (50,000 Units total) by mouth every 7 (seven) days. 09/06/17   Erlene Quan, NP    Allergies Patient has no known allergies.    Social History Social History   Tobacco Use  . Smoking status: Former Smoker    Years: 10.00    Types: Cigarettes    Quit date: 1996    Years since quitting: 24.5  . Smokeless tobacco: Former Systems developer    Types: Snuff  Substance Use Topics  . Alcohol use: No  . Drug use: No    Review of Systems Patient denies headaches, rhinorrhea, blurry vision, numbness, shortness of breath, chest pain, edema, cough, abdominal pain, nausea, vomiting, diarrhea, dysuria, fevers, rashes or hallucinations unless otherwise stated above in  HPI. ____________________________________________   PHYSICAL EXAM:  VITAL SIGNS: Vitals:   10/01/18 0432 10/01/18 0641  BP: 128/77 132/87  Pulse: 82 93  Resp: 16 18  Temp: 98.7 F (37.1 C)   SpO2: 97% 97%    Constitutional: Alert and oriented.  Eyes: Conjunctivae are normal.  Head: Atraumatic. Nose: No congestion/rhinnorhea. Mouth/Throat: Mucous membranes are moist.   Neck: No stridor. Painless ROM.  Cardiovascular: Normal rate, regular rhythm. Grossly normal heart sounds.  Good peripheral circulation. Respiratory: Normal respiratory effort.  No retractions. Lungs CTAB. Gastrointestinal: Soft, obese,  Minimally ttp in RUQ, e. No distention. No abdominal bruits. No CVA tenderness. Genitourinary:  Musculoskeletal: No lower extremity tenderness nor edema.  No joint effusions. Neurologic:  Normal speech and language. No gross focal neurologic deficits are appreciated. No facial droop Skin:  Skin is warm, dry and intact. No rash noted. Psychiatric: Mood and affect are normal. Speech and behavior are normal.  ____________________________________________   LABS (all labs ordered are  listed, but only abnormal results are displayed)  Results for orders placed or performed during the hospital encounter of 10/01/18 (from the past 24 hour(s))  CBC with Differential     Status: Abnormal   Collection Time: 10/01/18  4:34 AM  Result Value Ref Range   WBC 11.4 (H) 4.0 - 10.5 K/uL   RBC 4.64 3.87 - 5.11 MIL/uL   Hemoglobin 13.6 12.0 - 15.0 g/dL   HCT 40.0 36.0 - 46.0 %   MCV 86.2 80.0 - 100.0 fL   MCH 29.3 26.0 - 34.0 pg   MCHC 34.0 30.0 - 36.0 g/dL   RDW 11.6 11.5 - 15.5 %   Platelets 424 (H) 150 - 400 K/uL   nRBC 0.0 0.0 - 0.2 %   Neutrophils Relative % 66 %   Neutro Abs 7.5 1.7 - 7.7 K/uL   Lymphocytes Relative 28 %   Lymphs Abs 3.2 0.7 - 4.0 K/uL   Monocytes Relative 5 %   Monocytes Absolute 0.6 0.1 - 1.0 K/uL   Eosinophils Relative 1 %   Eosinophils Absolute 0.1 0.0 - 0.5 K/uL   Basophils Relative 0 %   Basophils Absolute 0.0 0.0 - 0.1 K/uL   Immature Granulocytes 0 %   Abs Immature Granulocytes 0.05 0.00 - 0.07 K/uL  Comprehensive metabolic panel     Status: Abnormal   Collection Time: 10/01/18  4:34 AM  Result Value Ref Range   Sodium 133 (L) 135 - 145 mmol/L   Potassium 3.8 3.5 - 5.1 mmol/L   Chloride 95 (L) 98 - 111 mmol/L   CO2 27 22 - 32 mmol/L   Glucose, Bld 366 (H) 70 - 99 mg/dL   BUN 10 6 - 20 mg/dL   Creatinine, Ser 0.66 0.44 - 1.00 mg/dL   Calcium 9.4 8.9 - 10.3 mg/dL   Total Protein 8.1 6.5 - 8.1 g/dL   Albumin 3.9 3.5 - 5.0 g/dL   AST 13 (L) 15 - 41 U/L   ALT 18 0 - 44 U/L   Alkaline Phosphatase 104 38 - 126 U/L   Total Bilirubin 0.5 0.3 - 1.2 mg/dL   GFR calc non Af Amer >60 >60 mL/min   GFR calc Af Amer >60 >60 mL/min   Anion gap 11 5 - 15  Troponin I (High Sensitivity)     Status: None   Collection Time: 10/01/18  4:34 AM  Result Value Ref Range   Troponin I (High Sensitivity) 3 <18 ng/L   ____________________________________________  EKG My review and personal interpretation at Time: 4:39   Indication: chest pain  Rate: 90   Rhythm: sinus Axis: normal Other: normal intervals, no stemi ____________________________________________  RADIOLOGY  I personally reviewed all radiographic images ordered to evaluate for the above acute complaints and reviewed radiology reports and findings.  These findings were personally discussed with the patient.  Please see medical record for radiology report.  ____________________________________________   PROCEDURES  Procedure(s) performed:  Procedures    Critical Care performed: no ____________________________________________   INITIAL IMPRESSION / ASSESSMENT AND PLAN / ED COURSE  Pertinent labs & imaging results that were available during my care of the patient were reviewed by me and considered in my medical decision making (see chart for details).   DDX: Cholelithiasis, cholecystitis, pancreatitis, pneumonia, ACS, esophagitis, gastritis  Michelle Warner is a 43 y.o. who presents to the ED with symptoms as described above.  Patient nontoxic-appearing exam somewhat noted due to obesity.  EKG shows no evidence of acute ischemia and initial troponin is negative.  Have a lower suspicion for ACS.  Heart score of 2.  Will order serial enzymes.  Will order right upper quadrant ultrasound to evaluate for biliary pathology given location of her pain and mild leukocytosis.  No lower abdominal pain.  Clinical Course as of Oct 01 715  Tue Oct 01, 2018  0716 Patient reassessed.  Pain improved after GI cocktail.  Ultrasound is reassuring.  Denies any back pain.  No dysuria or hematuria.  Patient signed out to oncoming physician pending repeat troponin.  Anticipate discharge home.   [PR]    Clinical Course User Index [PR] Merlyn Lot, MD    The patient was evaluated in Emergency Department today for the symptoms described in the history of present illness. He/she was evaluated in the context of the global COVID-19 pandemic, which necessitated consideration that the patient  might be at risk for infection with the SARS-CoV-2 virus that causes COVID-19. Institutional protocols and algorithms that pertain to the evaluation of patients at risk for COVID-19 are in a state of rapid change based on information released by regulatory bodies including the CDC and federal and state organizations. These policies and algorithms were followed during the patient's care in the ED.  As part of my medical decision making, I reviewed the following data within the [place] notes reviewed and incorporated, Labs reviewed, notes from prior ED visits and [place] Controlled Substance Database   ____________________________________________   FINAL CLINICAL IMPRESSION(S) / ED DIAGNOSES  Final diagnoses:  RUQ abdominal pain  Chest pain, unspecified type      NEW MEDICATIONS STARTED DURING THIS VISIT:  New Prescriptions   No medications on file     Note:  This document was prepared using Dragon voice recognition software and may include unintentional dictation errors.    Merlyn Lot, MD 10/01/18 [phone]

## 2018-10-01 NOTE — Discharge Instructions (Addendum)

## 2018-10-01 NOTE — ED Notes (Signed)
Patient transported to X-ray at this time 

## 2018-10-01 NOTE — ED Triage Notes (Signed)
Patient ambulatory to triage with steady gait, without difficulty or distress noted, mask in place; pt reports since yesterday having upper CP radiating into back accomp by "heartburn"

## 2018-10-01 NOTE — ED Provider Notes (Signed)
Patient is in no acute distress at this time, still having some epigastric discomfort and pain.  Gastritis versus peptic ulcer disease is likely.  Also could be gastroparesis given her poorly controlled diabetes, however she does not have any vomiting.  Should be referred to GI for close outpatient follow-up.   Earleen Newport, MD 10/01/18 [phone]

## 2019-04-13 ENCOUNTER — Emergency Department
Admission: EM | Admit: 2019-04-13 | Discharge: 2019-04-13 | Disposition: A | Discharge status: Home / Self Care | Payer: Self-pay | Attending: Emergency Medicine | Admitting: Emergency Medicine

## 2019-04-13 ENCOUNTER — Other Ambulatory Visit: Payer: Self-pay

## 2019-04-13 DIAGNOSIS — Z87891 Personal history of nicotine dependence: Secondary | ICD-10-CM | POA: Insufficient documentation

## 2019-04-13 DIAGNOSIS — E1140 Type 2 diabetes mellitus with diabetic neuropathy, unspecified: Secondary | ICD-10-CM | POA: Insufficient documentation

## 2019-04-13 DIAGNOSIS — Z794 Long term (current) use of insulin: Secondary | ICD-10-CM | POA: Insufficient documentation

## 2019-04-13 DIAGNOSIS — I10 Essential (primary) hypertension: Secondary | ICD-10-CM | POA: Insufficient documentation

## 2019-04-13 DIAGNOSIS — K029 Dental caries, unspecified: Secondary | ICD-10-CM | POA: Insufficient documentation

## 2019-04-13 DIAGNOSIS — Z79899 Other long term (current) drug therapy: Secondary | ICD-10-CM | POA: Insufficient documentation

## 2019-04-13 MED ORDER — CLINDAMYCIN HCL 300 MG PO CAPS: 300.0000 mg | ORAL_CAPSULE | Freq: Three times a day (TID) | ORAL | 0 refills | Status: DC

## 2019-04-13 MED ORDER — CLINDAMYCIN HCL 150 MG PO CAPS
300.0000 mg | ORAL_CAPSULE | Freq: Once | ORAL | Status: AC
  Administered 2019-04-13: 300 mg via ORAL
  Filled 2019-04-13: qty 2

## 2019-04-13 MED ORDER — IBUPROFEN 600 MG PO TABS
600.0000 mg | ORAL_TABLET | ORAL | Status: AC
  Administered 2019-04-13: 600 mg via ORAL
  Filled 2019-04-13: qty 1

## 2019-04-13 NOTE — ED Provider Notes (Signed)
[facility] Emergency Department Provider Note   ____________________________________________   None    (approximate)  I have reviewed the triage vital signs and the nursing notes.   HISTORY  Chief Complaint Dental Pain    HPI Michelle Warner is a 43 y.o. female for evaluation of pain in her left jaw, reports dental pain  Reports history of lots of dental problems, lots of cavities in the past.  Today she started noticing pain in her left side of her jaw.  Not swollen.  No nausea or vomiting.  No difficulty swallowing.  Just pain, mostly in tooth that seems to be either on the top of bottom sometimes hard to tell.  No trouble breathing.  No neck swelling no facial swelling.  No fevers.  No exposure to Covid, she been staying home and sheltering  Denies pregnancy.   Past Medical History:  Diagnosis Date  . Anxiety   . Appendicitis   . Depression   . Diabetes mellitus without complication ([place])   . Heart murmur   . Hypercholesterolemia   . Hypertension     Patient Active Problem List   Diagnosis Date Noted  . Diabetic polyneuropathy ([place]) 04/17/2017  . Exercise counseling 12/11/2014  . Gravida 1 12/11/2014  . Parity 1 12/11/2014  . Abnormal presence of protein in urine 12/11/2014  . Dietary counseling and surveillance 12/11/2014  . Appendicitis 12/08/2014  . Body mass index of 60 or higher 08/20/2014  . Cannot sleep 08/20/2014  . Excessive and frequent menstruation 08/20/2014  . Dysmetabolic syndrome [id number]  . Obstructive sleep apnea of adult 08/20/2014  . Type II diabetes mellitus, uncontrolled ([place]) 08/20/2014  . Morbid obesity ([place]) 11/26/2007  . Allergic rhinitis, seasonal 09/04/2007  . Blood in the urine 08/16/2007  . Routine general medical examination at a health care facility 08/16/2007  . Migraine without aura and responsive to treatment 05/01/2007  . Undiagnosed cardiac murmurs 05/01/2007  . Allergic rhinitis 10/23/2006   . Gastro-esophageal reflux disease without esophagitis 09/04/2006  . Hypertension goal BP (blood pressure) < 140/90 09/04/2006  . HLD (hyperlipidemia) 09/04/2006  . Anemia, iron deficiency 09/04/2006    Past Surgical History:  Procedure Laterality Date  . APPENDECTOMY    . CESAREAN SECTION    . LAPAROSCOPIC APPENDECTOMY N/A 12/08/2014   Procedure: APPENDECTOMY LAPAROSCOPIC;  Surgeon: Sherri Rad, MD;  Location: [facility] ORS;  Service: General;  Laterality: N/A;  . TONSILLECTOMY      Prior to Admission medications   Medication Sig Start Date End Date Taking? Authorizing Provider  acetaminophen (TYLENOL) 500 MG tablet Take 1,000 mg by mouth at bedtime. For back, leg and knee pain    [provider]  albuterol (PROVENTIL HFA;VENTOLIN HFA) 108 (90 Base) MCG/ACT inhaler Inhale 1-2 puffs into the lungs every 6 (six) hours as needed for wheezing or shortness of breath. 04/12/17   Doles-Johnson, Teah, NP  albuterol (PROVENTIL) (2.5 MG/3ML) 0.083% nebulizer solution INHALE CONTENTS OF ONE VIAL USING NEBULIZER EVERY 6 HOURS AS NEEDED FOR WHEEZING OR SHORTNESS OF BREATH 06/25/17   Doles-Johnson, Teah, NP  cetirizine (ZYRTEC) 10 MG tablet TAKE ONE TABLET BY MOUTH EVERY DAY 02/07/18   Doles-Johnson, Teah, NP  clindamycin (CLEOCIN) 300 MG capsule Take 1 capsule (300 mg total) by mouth 3 (three) times daily. 04/13/19   Delman Kitten, MD  diphenhydrAMINE (BENADRYL) 25 mg capsule Take 1 capsule (25 mg total) by mouth every 6 (six) hours as needed. 12/12/17 12/12/18  Laban Emperor, PA-C  fluticasone (FLONASE) 50 MCG/ACT nasal spray Place 2 sprays into both nostrils daily. 01/30/18   Tawni Millers, MD  gabapentin (NEURONTIN) 300 MG capsule Take 1 capsule (300 mg total) by mouth at bedtime. 01/30/18   Tawni Millers, MD  glipiZIDE (GLUCOTROL) 10 MG tablet TAKE ONE TABLET BY MOUTH 2 TIMES A DAY BEFORE A MEAL 01/30/18   Tawni Millers, MD  hydrochlorothiazide (MICROZIDE) 12.5 MG capsule TAKE ONE TABLET BY MOUTH  EVERY DAY 07/27/17   Doles-Johnson, Teah, NP  ibuprofen (ADVIL,MOTRIN) 600 MG tablet Take 1 tablet (600 mg total) by mouth every 8 (eight) hours as needed. 04/26/18   Sable Feil, PA-C  ibuprofen (ADVIL,MOTRIN) 800 MG tablet TAKE ONE TABLET BY MOUTH EVERY 8 HOURS AS NEEDED 12/14/17   Doles-Johnson, Teah, NP  insulin detemir (LEVEMIR) 100 UNIT/ML injection Take 20 units in the morning and 20 units at night 01/30/18   Tawni Millers, MD  lisinopril (PRINIVIL,ZESTRIL) 20 MG tablet Take 1 tablet (20 mg total) by mouth daily. 01/30/18   Tawni Millers, MD  naproxen (NAPROSYN) 250 MG tablet Take 1 tablet (250 mg total) by mouth 2 (two) times daily with a meal. Patient taking differently: Take 250 mg by mouth 2 (two) times daily with a meal. Taking 1 tablet twice daily due to pain 10/11/17   Jerrol Banana., MD  nitrofurantoin, macrocrystal-monohydrate, (MACROBID) 100 MG capsule Take 1 capsule (100 mg total) by mouth 2 (two) times daily. 10/11/17   Jerrol Banana., MD  omeprazole (PRILOSEC) 40 MG capsule Take 1 capsule (40 mg total) by mouth 2 (two) times daily with a meal. 10/01/18 10/31/18  Earleen Newport, MD  omeprazole (PRILOSEC) 40 MG capsule Take 1 capsule (40 mg total) by mouth 2 (two) times daily before lunch and supper. 10/01/18 10/31/18  Earleen Newport, MD  oxyCODONE-acetaminophen (PERCOCET) 7.5-325 MG tablet Take 1 tablet by mouth every 6 (six) hours as needed for severe pain. 04/26/18   Sable Feil, PA-C  predniSONE (DELTASONE) 10 MG tablet Take 6 tablets on day 1, take 5 tablets on day 2, take 4 tablets on day 3, take 3 tablets on day 4, take 2 tablets on day 5, take 1 tablet on day 6 12/12/17   Laban Emperor, PA-C  rosuvastatin (CRESTOR) 10 MG tablet Take 1 tablet (10 mg total) by mouth daily. 01/30/18   Tawni Millers, MD  sucralfate (CARAFATE) 1 g tablet Take 1 tablet (1 g total) by mouth 4 (four) times daily as needed. 10/01/18 10/01/19  Merlyn Lot, MD  sucralfate  (CARAFATE) 1 g tablet Take 1 tablet (1 g total) by mouth 4 (four) times daily. 10/01/18 10/01/19  Earleen Newport, MD  sulfamethoxazole-trimethoprim (BACTRIM DS,SEPTRA DS) 800-160 MG tablet Take 1 tablet by mouth 2 (two) times daily. 04/26/18   Sable Feil, PA-C  traMADol (ULTRAM) 50 MG tablet Take 1 tablet (50 mg total) by mouth every 6 (six) hours as needed. 10/01/18 10/01/19  Earleen Newport, MD  traZODone (DESYREL) 50 MG tablet Take 1 tablet (50 mg total) by mouth at bedtime as needed. Patient taking differently: Take 50 mg by mouth at bedtime as needed. Prescribed 1 1/2 - 3 tablets at bedtime. Take 2 tablets. 01/11/15   [name], MD  Vitamin D, Ergocalciferol, (DRISDOL) 50000 units CAPS capsule Take 1 capsule (50,000 Units total) by mouth every 7 (seven) days. 09/06/17   Erlene Quan, NP    Allergies Patient  has no known allergies.  Family History  Problem Relation Age of Onset  . Hypertension Mother   . Heart disease Mother   . Hypertension Father   . Stroke Father   . Heart disease Father     Social History Social History   Tobacco Use  . Smoking status: Former Smoker    Years: 10.00    Types: Cigarettes    Quit date: 1996    Years since quitting: 25.0  . Smokeless tobacco: Former Systems developer    Types: Snuff  Substance Use Topics  . Alcohol use: No  . Drug use: No    Review of Systems Constitutional: No fever/chills Eyes: No visual changes. ENT: No sore throat.  See HPI Cardiovascular: Denies chest pain. Respiratory: Denies shortness of breath. Skin: Negative for rash. Neurological: Negative for headaches      ____________________________________________   PHYSICAL EXAM:  VITAL SIGNS: ED Triage Vitals  Enc Vitals Group     BP 04/13/19 0132 130/81     Pulse Rate 04/13/19 0132 83     Resp 04/13/19 0132 18     Temp 04/13/19 0132 98.2 F (36.8 C)     Temp Source 04/13/19 0132 Oral     SpO2 04/13/19 0132 98 %     Weight 04/13/19  0132 222 lb (100.7 kg)     Height 04/13/19 0132 5\' 2"  (1.575 m)     Head Circumference --      Peak Flow --      Pain Score 04/13/19 0131 9     Pain Loc --      Pain Edu? --      Excl. in [place]? --     Constitutional: Alert and oriented. Well appearing and in no acute distress. Eyes: Conjunctivae are normal. Head: Atraumatic. Nose: No congestion/rhinnorhea. Mouth/Throat: Mucous membranes are moist.  There is no swelling of the anterior neck.  There is no facial swelling.  There is no evidence of abscess or edema within the oral cavity.  She does have point tenderness to a premolar on the left lower jaw without significant associated fistula or swelling but some soft buccal irritation is present here.  The upper mouth and teeth are nontender without swelling.  She reports point tenderness that reproduces the pain in the left premolar, and does have a cavity within it.  No signs or symptoms of Ludewig's or abscess denoted Neck: No stridor.  Cardiovascular: Normal rate, regular rhythm. Grossly normal heart sounds.  Good peripheral circulation. Respiratory: Normal respiratory effort.  No retractions. Lungs CTAB. Neurologic:  Normal speech and language. No gross focal neurologic deficits are appreciated.  Skin:  Skin is warm, dry and intact. No rash noted. Psychiatric: Mood and affect are normal. Speech and behavior are normal.  ____________________________________________   LABS (all labs ordered are listed, but only abnormal results are displayed)  Labs Reviewed - No data to display ____________________________________________  EKG   ____________________________________________  RADIOLOGY   ____________________________________________   PROCEDURES  Procedure(s) performed: None  Procedures  Critical Care performed: No  ____________________________________________   INITIAL IMPRESSION / ASSESSMENT AND PLAN / ED COURSE  Pertinent labs & imaging results that were available  during my care of the patient were reviewed by me and considered in my medical decision making (see chart for details).   Examination consistent with dental pain, coming from left lower jaw premolar.  Evidence of cavity, I suspect likely during develop early signs or symptoms of dental infection, possible pulpitis or  abscess.  Reassuring clinical exam.  At this point discussed with the patient, she is amenable to following close with dental, discussed antibiotic options and she would like to select clindamycin which I think is reasonable for this.  Discussed careful return precautions for which she is agreeable  Return precautions and treatment recommendations and follow-up discussed with the patient who is agreeable with the plan.       ____________________________________________   FINAL CLINICAL IMPRESSION(S) / ED DIAGNOSES  Final diagnoses:  Dental cavity        Note:  This document was prepared using Dragon voice recognition software and may include unintentional dictation errors       Delman Kitten, MD 04/13/19 [phone]

## 2019-04-13 NOTE — ED Triage Notes (Signed)
Patient reports dental pain to upper and lower teeth on left side.

## 2019-04-13 NOTE — Discharge Instructions (Addendum)
You have been seen in the Emergency Department (ED) today for dental pain.  Please take your prescribed antibiotic.  You may take pain medication as needed but ONLY as prescribed.  You should also take over-the-counter pain medication such as ibuprofen according to the label instructions unless a doctor has previously told you to avoid this type of medication (due to stomach ulcers, for example).  Please see you dentist as soon as possible; only a dentist will be able to fix your problem(s).  Please see below for dental follow up options.  Return to the ED if you develop worsening pain, fever, pus/drainage, difficulty breathing, or other symptoms that concern you.  OPTIONS FOR DENTAL FOLLOW UP CARE  [facility] [place] [email or web address] [phone])  [facility] [phone])  [place] [phone])  [place] [phone])  [facility] [phone]) This clinic caters to the indigent population and is on a lottery system. Location: [employer] [facility], [place], [place], [place] [facility] Hours: Wednesdays from 6pm - 9pm, patients seen by a lottery system. For dates, call or go to [email or web address] Services: Cleanings, fillings and simple extractions. Payment Options: DENTAL WORK IS FREE OF CHARGE. Bring proof of income or support. Best way to get seen: Arrive at 5:15 pm - this is a lottery, NOT first come/first serve, so arriving earlier will not increase your chances of being seen.     [facility] [phone] Select option 1 for emergencies   Location: [facility], [place], [address] [place] [facility] Hours: No walk-ins accepted - call the day before to schedule an appointment. Check  in times are 9:30 am and 1:30 pm. Services: Simple extractions, temporary fillings, pulpectomy/pulp debridement, uncomplicated abscess drainage. Payment Options: PAYMENT IS DUE AT THE TIME OF SERVICE.  Fee is usually $100-200, additional surgical procedures (e.g. abscess drainage) may be extra. Cash, checks, Visa/MasterCard accepted.  Can file Medicaid if patient is covered for dental - patient should call case worker to check. No discount for [facility] patients. Best way to get seen: MUST call the day before and get onto the schedule. Can usually be seen the next 1-2 days. No walk-ins accepted.     [place] [phone]   Location: [place], [place] [facility] Hours: M, W, Th, F 8am or 1:30pm, Tues 9a or 1:30 - first come/first served. Services: Simple extractions, temporary fillings, uncomplicated abscess drainage.  You do not need to be an [facility] resident. Payment Options: PAYMENT IS DUE AT THE TIME OF SERVICE. Dental insurance, otherwise sliding scale - bring proof of income or support. Depending on income and treatment needed, cost is usually $50-200. Best way to get seen: Arrive early as it is first come/first served.     [facility] [phone]   Location: [place] [facility] Hours: Mon-Thu 8a-5p Services: Most basic dental services including extractions and fillings. Payment Options: PAYMENT IS DUE AT THE TIME OF SERVICE. Sliding scale, up to 50% off - bring proof if income or support. Medicaid with dental option accepted. Best way to get seen: Call to schedule an appointment, can usually be seen within 2 weeks OR they will try to see walk-ins - show up at [place] or 2p (you may have to wait).  [facility] RESIDENTS ONLY   Location: [facility], [place] [address], [place], [place]  [address] Clinic Hours: By appointment only. Monday - Thursday 8am-5pm, Friday 8am-12pm Services: Cleanings, fillings, extractions. Payment Options: PAYMENT IS DUE AT THE TIME OF SERVICE. Cash, Visa or MasterCard. Sliding scale - $30 minimum per service. Best way to get seen: Come in to office, complete packet and make an appointment - need proof of income or support monies for each household member and proof of [facility] residence. Usually takes about a month to get in.     [facility] [phone]   Location: [address], [place] Clinic Hours: Walk-in Urgent Care Dental Services are offered Monday-Friday mornings only. The numbers of emergencies accepted daily is limited to the number of providers available. Maximum 15 - Mondays, Wednesdays & Thursdays Maximum 10 - Tuesdays & Fridays Services: You do not need to be a [facility] resident to be seen for a dental emergency. Emergencies are defined as pain, swelling, abnormal bleeding, or dental trauma. Walkins will receive x-rays if needed. NOTE: Dental cleaning is not an emergency. Payment Options: PAYMENT IS DUE AT THE TIME OF SERVICE. Minimum co-pay is $40.00 for uninsured patients. Minimum co-pay is $3.00 for Medicaid with dental coverage. Dental Insurance is accepted and must be presented at time of visit. Medicare does not cover dental. Forms of payment: Cash, credit card, checks. Best way to get seen: If not previously registered with the clinic, walk-in dental registration begins at 7:15 am and is on a first come/first serve basis. If previously registered with the clinic, call to make an appointment.     [facility] [place] ONLY   Location: [address] [place], [place] Clinic Hours: Mon-Thu 10a-2p Services: Extractions only! Payment Options: FREE (donations accepted) - bring proof of income or support Best way to get seen: Call  and schedule an appointment OR come at 8am on the 1st Monday of every month (except for holidays) when it is first come/first served.     [facility] [phone]   Location: [place], [place] [facility] Hours: Friday mornings Services, Payment Options, Best way to get seen: Call for info

## 2019-06-27 DIAGNOSIS — G8929 Other chronic pain: Secondary | ICD-10-CM | POA: Insufficient documentation

## 2019-06-27 DIAGNOSIS — M545 Low back pain, unspecified: Secondary | ICD-10-CM | POA: Insufficient documentation

## 2019-08-25 LAB — HM PAP SMEAR

## 2019-08-25 LAB — RESULTS CONSOLE HPV: CHL HPV: NEGATIVE

## 2020-02-27 ENCOUNTER — Telehealth: Payer: Self-pay | Admitting: Pharmacy Technician

## 2020-02-27 ENCOUNTER — Ambulatory Visit: Payer: Medicaid Other

## 2020-02-27 NOTE — Telephone Encounter (Signed)
Received letter showing health insurance coverage ended.  Patient eligible to receive medication assistance at [facility] until time for re-certification in 0258, and as long as eligibility requirements continue to be met.  [facility]

## 2020-03-01 ENCOUNTER — Ambulatory Visit: Payer: Medicaid Other | Admitting: Pharmacy Technician

## 2020-03-01 ENCOUNTER — Other Ambulatory Visit: Payer: Self-pay

## 2020-03-01 DIAGNOSIS — Z79899 Other long term (current) drug therapy: Secondary | ICD-10-CM

## 2020-03-01 NOTE — Progress Notes (Signed)
Completed [facility] application and contract.  Patient agreed to all terms of the [facility] contract.    Patient approved to receive medication assistance at [facility] until time for re-certification in 4401, and as long as eligibility criteria continues to be met.    Provided patient with community resource material based on her particular needs.    [facility]

## 2020-03-25 ENCOUNTER — Telehealth: Payer: Self-pay | Admitting: Pharmacist

## 2020-03-25 ENCOUNTER — Other Ambulatory Visit: Payer: Self-pay

## 2020-03-25 ENCOUNTER — Ambulatory Visit: Payer: Medicaid Other | Admitting: Family Medicine

## 2020-03-25 VITALS — BP 120/85 | HR 78 | Ht 62.0 in | Wt 236.3 lb

## 2020-03-25 DIAGNOSIS — E1165 Type 2 diabetes mellitus with hyperglycemia: Secondary | ICD-10-CM

## 2020-03-25 DIAGNOSIS — I10 Essential (primary) hypertension: Secondary | ICD-10-CM

## 2020-03-25 DIAGNOSIS — J45909 Unspecified asthma, uncomplicated: Secondary | ICD-10-CM | POA: Insufficient documentation

## 2020-03-25 NOTE — Telephone Encounter (Signed)
03/25/2020 2:45:10 PM - Talked with patient -to come sign forms  -- Rhetta Mura - Thursday, March 25, 2020 2:41 PM --I received a call today from Jan @ [facility] [address] [place], [place] [address] Phone: [phone] Fax: [phone]--she advised to use provider Traci Sermon, MD, I will mail forms for ProAir HFA, Flovent HFA, Toujeo Solostar, Trulicity, & Jardiance for provider to sign, date, fill in dx code, & DEA# and return to [place]. I called patient and let her know this information. She has appt. there 04/06/2020-I asked patient to check with me on 04/05/20 to see if we have received forms back if not she can remind them while she is there. Patient will come by our office 03/26/2020 to sign her portion of applications.

## 2020-03-25 NOTE — Patient Instructions (Signed)
Increase insulin to 28 units a day. If your morning sugars are still averaging more than 140-150, go up to 30 units.  Can increase by 2 units a day every 4-5 days if sugars stay more than 120-130. Follow up with your provider at [facility].

## 2020-03-25 NOTE — Progress Notes (Signed)
Established Patient Office Visit  Subjective:  Patient ID: Michelle Warner, female    DOB: 1975/07/25  Age: 45 y.o. MRN: [id number]  CC: No chief complaint on file.   HPI Michelle Warner presents for eval today.  She had been followed at [facility] but was without insurance briefly but has, or is about to have, Medicaid, soon.  She is on insulin, Trulicity and glipizide for DM.  Last HgA1c was 9.7 on 12/09/19.  BS's in the AM run 150-200 reportedly.  She feels fine with normal energy, no increased thirst.  Says asthma flares up occasionally.  Uses Albuterol as needed, which helps relieve breathing.  Says she doesn't use it regularly, only when exposed to smoke or other triggers.  Doesn't check BP's.  BM's, urination, appetite are normal. No acute complaints.  Past Medical History:  Diagnosis Date  . Anxiety   . Appendicitis   . Depression   . Diabetes mellitus without complication (HCC)   . Heart murmur   . Hypercholesterolemia   . Hypertension     Past Surgical History:  Procedure Laterality Date  . APPENDECTOMY    . CESAREAN SECTION    . LAPAROSCOPIC APPENDECTOMY N/A 12/08/2014   Procedure: APPENDECTOMY LAPAROSCOPIC;  Surgeon: Natale Lay, MD;  Location: [facility] ORS;  Service: General;  Laterality: N/A;  . TONSILLECTOMY      Family History  Problem Relation Age of Onset  . Hypertension Mother   . Heart disease Mother   . Hypertension Father   . Stroke Father   . Heart disease Father     Social History   Socioeconomic History  . Marital status: Married    Spouse name: Not on file  . Number of children: Not on file  . Years of education: Not on file  . Highest education level: Not on file  Occupational History  . Not on file  Tobacco Use  . Smoking status: Former Smoker    Years: 10.00    Types: Cigarettes    Quit date: 1996    Years since quitting: 26.0  . Smokeless tobacco: Former Neurosurgeon    Types: Snuff  Vaping Use  . Vaping Use: Never used  Substance and Sexual Activity   . Alcohol use: No  . Drug use: No  . Sexual activity: Not on file  Other Topics Concern  . Not on file  Social History Narrative  . Not on file   Social Determinants of Health   Financial Resource Strain: Not on file  Food Insecurity: Not on file  Transportation Needs: Not on file  Physical Activity: Not on file  Stress: Not on file  Social Connections: Not on file  Intimate Partner Violence: Not on file    Outpatient Medications Prior to Visit  Medication Sig Dispense Refill  . acetaminophen (TYLENOL) 500 MG tablet Take 1,000 mg by mouth at bedtime. For back, leg and knee pain    . albuterol (PROVENTIL HFA;VENTOLIN HFA) 108 (90 Base) MCG/ACT inhaler Inhale 1-2 puffs into the lungs every 6 (six) hours as needed for wheezing or shortness of breath. 1 Inhaler 3  . albuterol (PROVENTIL) (2.5 MG/3ML) 0.083% nebulizer solution INHALE CONTENTS OF ONE VIAL USING NEBULIZER EVERY 6 HOURS AS NEEDED FOR WHEEZING OR SHORTNESS OF BREATH 30 vial 0  . cetirizine (ZYRTEC) 10 MG tablet TAKE ONE TABLET BY MOUTH EVERY DAY 30 tablet 0  . clindamycin (CLEOCIN) 300 MG capsule Take 1 capsule (300 mg total) by mouth 3 (three) times  daily. 30 capsule 0  . diphenhydrAMINE (BENADRYL) 25 mg capsule Take 1 capsule (25 mg total) by mouth every 6 (six) hours as needed. 30 capsule 0  . fluticasone (FLONASE) 50 MCG/ACT nasal spray Place 2 sprays into both nostrils daily. 16 g 0  . gabapentin (NEURONTIN) 300 MG capsule Take 1 capsule (300 mg total) by mouth at bedtime. 90 capsule 3  . glipiZIDE (GLUCOTROL) 10 MG tablet TAKE ONE TABLET BY MOUTH 2 TIMES A DAY BEFORE A MEAL 120 tablet 3  . hydrochlorothiazide (MICROZIDE) 12.5 MG capsule TAKE ONE TABLET BY MOUTH EVERY DAY 90 capsule 0  . ibuprofen (ADVIL,MOTRIN) 600 MG tablet Take 1 tablet (600 mg total) by mouth every 8 (eight) hours as needed. 15 tablet 0  . ibuprofen (ADVIL,MOTRIN) 800 MG tablet TAKE ONE TABLET BY MOUTH EVERY 8 HOURS AS NEEDED 30 tablet 0  . insulin  detemir (LEVEMIR) 100 UNIT/ML injection Take 20 units in the morning and 20 units at night 10 mL 2  . lisinopril (PRINIVIL,ZESTRIL) 20 MG tablet Take 1 tablet (20 mg total) by mouth daily. 90 tablet 3  . naproxen (NAPROSYN) 250 MG tablet Take 1 tablet (250 mg total) by mouth 2 (two) times daily with a meal. (Patient taking differently: Take 250 mg by mouth 2 (two) times daily with a meal. Taking 1 tablet twice daily due to pain) 60 tablet 0  . nitrofurantoin, macrocrystal-monohydrate, (MACROBID) 100 MG capsule Take 1 capsule (100 mg total) by mouth 2 (two) times daily. 10 capsule 0  . omeprazole (PRILOSEC) 40 MG capsule Take 1 capsule (40 mg total) by mouth 2 (two) times daily with a meal. 30 capsule 1  . omeprazole (PRILOSEC) 40 MG capsule Take 1 capsule (40 mg total) by mouth 2 (two) times daily before lunch and supper. 30 capsule 1  . oxyCODONE-acetaminophen (PERCOCET) 7.5-325 MG tablet Take 1 tablet by mouth every 6 (six) hours as needed for severe pain. 12 tablet 0  . predniSONE (DELTASONE) 10 MG tablet Take 6 tablets on day 1, take 5 tablets on day 2, take 4 tablets on day 3, take 3 tablets on day 4, take 2 tablets on day 5, take 1 tablet on day 6 21 tablet 0  . rosuvastatin (CRESTOR) 10 MG tablet Take 1 tablet (10 mg total) by mouth daily. 90 tablet 3  . sucralfate (CARAFATE) 1 g tablet Take 1 tablet (1 g total) by mouth 4 (four) times daily as needed. 40 tablet 0  . sucralfate (CARAFATE) 1 g tablet Take 1 tablet (1 g total) by mouth 4 (four) times daily. 120 tablet 1  . sulfamethoxazole-trimethoprim (BACTRIM DS,SEPTRA DS) 800-160 MG tablet Take 1 tablet by mouth 2 (two) times daily. 20 tablet 0  . traZODone (DESYREL) 50 MG tablet Take 1 tablet (50 mg total) by mouth at bedtime as needed. (Patient taking differently: Take 50 mg by mouth at bedtime as needed. Prescribed 1 1/2 - 3 tablets at bedtime. Take 2 tablets.) 30 tablet 5  . Vitamin D, Ergocalciferol, (DRISDOL) 50000 units CAPS capsule Take  1 capsule (50,000 Units total) by mouth every 7 (seven) days. 5 capsule 5   No facility-administered medications prior to visit.    No Known Allergies  ROS Review of Systems    Objective:    Physical Exam  There were no vitals taken for this visit. Wt Readings from Last 3 Encounters:  04/13/19 222 lb (100.7 kg)  10/01/18 229 lb (103.9 kg)  01/30/18 239 lb 9.6  oz (108.7 kg)     Health Maintenance Due  Topic Date Due  . Hepatitis C Screening  Never done  . PNEUMOCOCCAL POLYSACCHARIDE VACCINE AGE 51-64 HIGH RISK  Never done  . COVID-19 Vaccine (1) Never done  . OPHTHALMOLOGY EXAM  Never done  . HIV Screening  Never done  . TETANUS/TDAP  Never done  . PAP SMEAR-Modifier  Never done  . FOOT EXAM  05/30/2018  . HEMOGLOBIN A1C  07/25/2018  . INFLUENZA VACCINE  Never done    There are no preventive care reminders to display for this patient.  Lab Results  Component Value Date   TSH 1.440 01/04/2017   Lab Results  Component Value Date   WBC 11.4 (H) 10/01/2018   HGB 13.6 10/01/2018   HCT 40.0 10/01/2018   MCV 86.2 10/01/2018   PLT 424 (H) 10/01/2018   Lab Results  Component Value Date   NA 133 (L) 10/01/2018   K 3.8 10/01/2018   CO2 27 10/01/2018   GLUCOSE 366 (H) 10/01/2018   BUN 10 10/01/2018   CREATININE 0.66 10/01/2018   BILITOT 0.5 10/01/2018   ALKPHOS 104 10/01/2018   AST 13 (L) 10/01/2018   ALT 18 10/01/2018   PROT 8.1 10/01/2018   ALBUMIN 3.9 10/01/2018   CALCIUM 9.4 10/01/2018   ANIONGAP 11 10/01/2018   Lab Results  Component Value Date   CHOL 148 01/24/2018   Lab Results  Component Value Date   HDL 42 01/24/2018   Lab Results  Component Value Date   LDLCALC 54 01/24/2018   Lab Results  Component Value Date   TRIG 260 (H) 01/24/2018   Lab Results  Component Value Date   CHOLHDL 3.5 01/24/2018   Lab Results  Component Value Date   HGBA1C 10.6 (H) 01/24/2018   OP and conjunctivae clear CTA RRR NT/ND   Assessment & Plan:    Problem List Items Addressed This Visit   None   BS's not optimally controlled.  Increase insulin to 28 units a day; can titrate up the dose by 2 units every few days if BS's remain high.  Use Albuterol as directed, up to q3-4 hr if needed.  If she starts using it more than 1/2 the days, she will need a maintenance inhaler.  Stay on all other current medications.  She plans to follow up with [facility] when her Medicaid comes through.  No orders of the defined types were placed in this encounter.   Follow-up: No follow-ups on file.    Dorothey Baseman, MD

## 2020-03-25 NOTE — Telephone Encounter (Signed)
03/25/2020 2:45:10 PM - Talked with patient -to come sign forms  -- Annette Johnson - Thursday, March 25, 2020 2:41 PM --I received a call today from Jan @ [facility] [address] [place], [place] [address] Phone: [phone] Fax: [phone]--she advised to use provider Susan F, Slatkoff, MD, I will mail forms for ProAir HFA, Flovent HFA, Toujeo Solostar, Trulicity, & Jardiance for provider to sign, date, fill in dx code, & DEA# and return to us. I called patient and let her know this information. She has appt. there 04/06/2020-I asked patient to check with me on 04/05/20 to see if we have received forms back if not she can remind them while she is there. Patient will come by our office 03/26/2020 to sign her portion of applications. 

## 2020-05-04 ENCOUNTER — Telehealth: Payer: Self-pay | Admitting: Pharmacy Technician

## 2020-05-04 NOTE — Telephone Encounter (Signed)
Patient has Medicaid with prescription drug coverage.  No longer meets [facility] eligibility criteria.  Pt notified verbally and by letter.  [place], [place]  [address]  May 04, 2020    Timber Marshman [address] [place], [place]  [address]  Dear Vaughan Basta:  This is to inform you that you are no longer eligible to receive medication assistance at [facility].  The reason(s) are:    _____Your total gross monthly household income exceeds 250% of the Federal Poverty Level.   _____Tangible assets (savings, checking, stocks/bonds, pension, retirement, etc.) exceeds our limit  _____You are eligible to receive benefits from [facility], [facility] or HIV Medication            Assistance program _____You are eligible to receive benefits from a Medicare Part "D" plan __X__You have prescription insurance  _____You are not an [facility] resident _____Failure to provide all requested proof of income information for 2022.    We regret that we are unable to help you at this time.  If your prescription coverage is terminated, please contact [facility], so that we may reassess your eligibility for our program.  If you have questions, we may be contacted at [phone].  Thank you,  [facility]

## 2020-06-25 ENCOUNTER — Other Ambulatory Visit: Payer: Self-pay

## 2020-06-25 MED ORDER — CITALOPRAM HYDROBROMIDE 20 MG PO TABS
ORAL_TABLET | ORAL | 0 refills | Status: DC
  Filled 2020-06-25: qty 19, 19d supply, fill #0

## 2020-06-25 MED ORDER — TRAZODONE HCL 100 MG PO TABS
ORAL_TABLET | ORAL | 0 refills | Status: DC
  Filled 2020-06-25: qty 19, 19d supply, fill #0

## 2020-08-04 ENCOUNTER — Other Ambulatory Visit: Payer: Self-pay

## 2020-09-20 ENCOUNTER — Other Ambulatory Visit: Payer: Self-pay

## 2020-09-20 ENCOUNTER — Encounter: Payer: Self-pay | Admitting: Emergency Medicine

## 2020-09-20 ENCOUNTER — Emergency Department
Admission: EM | Admit: 2020-09-20 | Discharge: 2020-09-20 | Disposition: A | Discharge status: Home / Self Care | Payer: Medicaid Other | Attending: Emergency Medicine | Admitting: Emergency Medicine

## 2020-09-20 ENCOUNTER — Emergency Department: Payer: Medicaid Other

## 2020-09-20 DIAGNOSIS — D259 Leiomyoma of uterus, unspecified: Secondary | ICD-10-CM | POA: Diagnosis not present

## 2020-09-20 DIAGNOSIS — K529 Noninfective gastroenteritis and colitis, unspecified: Secondary | ICD-10-CM

## 2020-09-20 DIAGNOSIS — Z794 Long term (current) use of insulin: Secondary | ICD-10-CM | POA: Insufficient documentation

## 2020-09-20 DIAGNOSIS — Z7951 Long term (current) use of inhaled steroids: Secondary | ICD-10-CM | POA: Diagnosis not present

## 2020-09-20 DIAGNOSIS — J45909 Unspecified asthma, uncomplicated: Secondary | ICD-10-CM | POA: Diagnosis not present

## 2020-09-20 DIAGNOSIS — I10 Essential (primary) hypertension: Secondary | ICD-10-CM | POA: Insufficient documentation

## 2020-09-20 DIAGNOSIS — R1032 Left lower quadrant pain: Secondary | ICD-10-CM | POA: Diagnosis present

## 2020-09-20 DIAGNOSIS — D219 Benign neoplasm of connective and other soft tissue, unspecified: Secondary | ICD-10-CM

## 2020-09-20 DIAGNOSIS — R10A2 Flank pain, left side: Secondary | ICD-10-CM

## 2020-09-20 DIAGNOSIS — Z79899 Other long term (current) drug therapy: Secondary | ICD-10-CM | POA: Insufficient documentation

## 2020-09-20 DIAGNOSIS — Z7984 Long term (current) use of oral hypoglycemic drugs: Secondary | ICD-10-CM | POA: Diagnosis not present

## 2020-09-20 DIAGNOSIS — E1142 Type 2 diabetes mellitus with diabetic polyneuropathy: Secondary | ICD-10-CM | POA: Diagnosis not present

## 2020-09-20 DIAGNOSIS — Z87891 Personal history of nicotine dependence: Secondary | ICD-10-CM | POA: Diagnosis not present

## 2020-09-20 DIAGNOSIS — R109 Unspecified abdominal pain: Secondary | ICD-10-CM

## 2020-09-20 LAB — BLOOD GAS, VENOUS
Acid-base deficit: 4.9 mmol/L — ABNORMAL HIGH (ref 0.0–2.0)
Bicarbonate: 19.8 mmol/L — ABNORMAL LOW (ref 20.0–28.0)
O2 Saturation: 89.9 %
Patient temperature: 37
pCO2, Ven: 35 mmHg — ABNORMAL LOW (ref 44.0–60.0)
pH, Ven: 7.36 (ref 7.250–7.430)
pO2, Ven: 61 mmHg — ABNORMAL HIGH (ref 32.0–45.0)

## 2020-09-20 LAB — COMPREHENSIVE METABOLIC PANEL
ALT: 18 U/L (ref 0–44)
AST: 17 U/L (ref 15–41)
Albumin: 4.2 g/dL (ref 3.5–5.0)
Alkaline Phosphatase: 67 U/L (ref 38–126)
Anion gap: 13 (ref 5–15)
BUN: 17 mg/dL (ref 6–20)
CO2: 15 mmol/L — ABNORMAL LOW (ref 22–32)
Calcium: 8.9 mg/dL (ref 8.9–10.3)
Chloride: 104 mmol/L (ref 98–111)
Creatinine, Ser: 0.61 mg/dL (ref 0.44–1.00)
GFR, Estimated: >60 mL/min (ref >60)
Glucose, Bld: 324 mg/dL — ABNORMAL HIGH (ref 70–99)
Potassium: 4.3 mmol/L (ref 3.5–5.1)
Sodium: 132 mmol/L — ABNORMAL LOW (ref 135–145)
Total Bilirubin: 0.6 mg/dL (ref 0.3–1.2)
Total Protein: 8.3 g/dL — ABNORMAL HIGH (ref 6.5–8.1)

## 2020-09-20 LAB — LIPASE, BLOOD: Lipase: 24 U/L (ref 11–51)

## 2020-09-20 LAB — URINALYSIS, COMPLETE (UACMP) WITH MICROSCOPIC
Bacteria, UA: NONE SEEN
Bilirubin Urine: NEGATIVE
Glucose, UA: >=500 mg/dL — AB
Ketones, ur: NEGATIVE mg/dL
Leukocytes,Ua: NEGATIVE
Nitrite: NEGATIVE
Protein, ur: NEGATIVE mg/dL
Specific Gravity, Urine: >1.046 — ABNORMAL HIGH (ref 1.005–1.030)
WBC, UA: NONE SEEN WBC/hpf (ref 0–5)
pH: 5 (ref 5.0–8.0)

## 2020-09-20 LAB — BETA-HYDROXYBUTYRIC ACID: Beta-Hydroxybutyric Acid: 0.12 mmol/L (ref 0.05–0.27)

## 2020-09-20 LAB — CBC
HCT: 42.6 % (ref 36.0–46.0)
Hemoglobin: 14.5 g/dL (ref 12.0–15.0)
MCH: 30.7 pg (ref 26.0–34.0)
MCHC: 34 g/dL (ref 30.0–36.0)
MCV: 90.1 fL (ref 80.0–100.0)
Platelets: 349 10*3/uL (ref 150–400)
RBC: 4.73 MIL/uL (ref 3.87–5.11)
RDW: 12 % (ref 11.5–15.5)
WBC: 12.8 10*3/uL — ABNORMAL HIGH (ref 4.0–10.5)
nRBC: 0 % (ref 0.0–0.2)

## 2020-09-20 LAB — POC URINE PREG, ED: Preg Test, Ur: NEGATIVE

## 2020-09-20 MED ORDER — SODIUM CHLORIDE 0.9 % IV BOLUS
1000.0000 mL | Freq: Once | INTRAVENOUS | Status: AC
  Administered 2020-09-20: 1000 mL via INTRAVENOUS

## 2020-09-20 MED ORDER — IOHEXOL 300 MG/ML  SOLN
100.0000 mL | Freq: Once | INTRAMUSCULAR | Status: AC | PRN
  Administered 2020-09-20: 100 mL via INTRAVENOUS

## 2020-09-20 NOTE — Discharge Instructions (Addendum)
Please if you are able to collect a stool sample with the container provided and bring to our lab for processing.  Follow-up closely with gastroenterology as well as your OB/GYN provider.   Please return to the emergency room right away if you are to develop a fever, severe nausea, your pain becomes severe or worsens, you are unable to keep food down, begin vomiting any dark or bloody fluid, you develop any dark or bloody stools, feel dehydrated, or other new concerns or symptoms arise.

## 2020-09-20 NOTE — ED Provider Notes (Signed)
[facility] Emergency Department Provider Note ____________________________________________   Event Date/Time   First MD Initiated Contact with Patient 09/20/20 1723     (approximate)  I have reviewed the triage vital signs and the nursing notes.   HISTORY  Chief Complaint Abdominal Pain    HPI Michelle Warner is a 45 y.o. female history of diabetes prior appendicitis depression anxiety  Patient reports for about a month to month and a half now she will have episodes of discomfort in her left lower abdomen and a little bit in her left flank and they seem to be accompanied by about a day or so of loose watery stools.  Today she began experiencing discomfort again in the left lower abdomen left flank very mild nausea which is now resolved, and she has had several loose watery nonbloody stools.  Denies vaginal discharge or bleeding.  Denies "pelvic pain".  Denies pregnancy.  She has had symptoms similar to this off and on a few times involving the left flank loose stools and then also will notice that at times she will belch which she describes as a "rotten egg" feeling  No vaginal discharge or bleeding.  Past Medical History:  Diagnosis Date   Anxiety    Appendicitis    Depression    Diabetes mellitus without complication ([place])    Heart murmur    Hypercholesterolemia    Hypertension     Patient Active Problem List   Diagnosis Date Noted   Asthma 03/25/2020   Diabetic polyneuropathy ([place]) 04/17/2017   Exercise counseling 12/11/2014   Gravida 1 12/11/2014   Parity 1 12/11/2014   Abnormal presence of protein in urine 12/11/2014   Dietary counseling and surveillance 12/11/2014   Appendicitis 12/08/2014   Body mass index of 60 or higher 08/20/2014   Cannot sleep 08/20/2014   Excessive and frequent menstruation 57/84/6962   Dysmetabolic syndrome 95/28/4132   Obstructive sleep apnea of adult 08/20/2014   Type II diabetes mellitus, uncontrolled  ([place]) 08/20/2014   Morbid obesity ([place]) 11/26/2007   Allergic rhinitis, seasonal 09/04/2007   Blood in the urine 08/16/2007   Routine general medical examination at a health care facility 08/16/2007   Migraine without aura and responsive to treatment 05/01/2007   Undiagnosed cardiac murmurs 05/01/2007   Allergic rhinitis 10/23/2006   Gastro-esophageal reflux disease without esophagitis 09/04/2006   Hypertension goal BP (blood pressure) < 140/90 09/04/2006   HLD (hyperlipidemia) 09/04/2006   Anemia, iron deficiency 09/04/2006    Past Surgical History:  Procedure Laterality Date   APPENDECTOMY     CESAREAN SECTION     LAPAROSCOPIC APPENDECTOMY N/A 12/08/2014   Procedure: APPENDECTOMY LAPAROSCOPIC;  Surgeon: Sherri Rad, MD;  Location: [facility] ORS;  Service: General;  Laterality: N/A;   TONSILLECTOMY      Prior to Admission medications   Medication Sig Start Date End Date Taking? Authorizing Provider  acetaminophen (TYLENOL) 500 MG tablet Take 1,000 mg by mouth at bedtime. For back, leg and knee pain    [provider]  albuterol (PROVENTIL HFA;VENTOLIN HFA) 108 (90 Base) MCG/ACT inhaler Inhale 1-2 puffs into the lungs every 6 (six) hours as needed for wheezing or shortness of breath. 04/12/17   Doles-Johnson, Teah, NP  albuterol (PROVENTIL) (2.5 MG/3ML) 0.083% nebulizer solution INHALE CONTENTS OF ONE VIAL USING NEBULIZER EVERY 6 HOURS AS NEEDED FOR WHEEZING OR SHORTNESS OF BREATH 06/25/17   Doles-Johnson, Teah, NP  cetirizine (ZYRTEC) 10 MG tablet TAKE ONE TABLET BY MOUTH EVERY  DAY 02/07/18   Doles-Johnson, Teah, NP  citalopram (CELEXA) 20 MG tablet Take 1 tablet by mouth daily. (Failure to attend appt on 07/13/20 will result in titrating off the medication) 06/24/20     clindamycin (CLEOCIN) 300 MG capsule Take 1 capsule (300 mg total) by mouth 3 (three) times daily. 04/13/19   Delman Kitten, MD  diphenhydrAMINE (BENADRYL) 25 mg capsule Take 1 capsule (25 mg total) by mouth every 6 (six)  hours as needed. 12/12/17 12/12/18  Laban Emperor, PA-C  fluticasone (FLONASE) 50 MCG/ACT nasal spray Place 2 sprays into both nostrils daily. 01/30/18   Tawni Millers, MD  gabapentin (NEURONTIN) 300 MG capsule Take 1 capsule (300 mg total) by mouth at bedtime. 01/30/18   Tawni Millers, MD  glipiZIDE (GLUCOTROL) 10 MG tablet TAKE ONE TABLET BY MOUTH 2 TIMES A DAY BEFORE A MEAL 01/30/18   Tawni Millers, MD  hydrochlorothiazide (MICROZIDE) 12.5 MG capsule TAKE ONE TABLET BY MOUTH EVERY DAY 07/27/17   Doles-Johnson, Teah, NP  ibuprofen (ADVIL,MOTRIN) 600 MG tablet Take 1 tablet (600 mg total) by mouth every 8 (eight) hours as needed. 04/26/18   Sable Feil, PA-C  ibuprofen (ADVIL,MOTRIN) 800 MG tablet TAKE ONE TABLET BY MOUTH EVERY 8 HOURS AS NEEDED 12/14/17   Doles-Johnson, Teah, NP  insulin detemir (LEVEMIR) 100 UNIT/ML injection Take 20 units in the morning and 20 units at night 01/30/18   Tawni Millers, MD  lisinopril (PRINIVIL,ZESTRIL) 20 MG tablet Take 1 tablet (20 mg total) by mouth daily. 01/30/18   Tawni Millers, MD  naproxen (NAPROSYN) 250 MG tablet Take 1 tablet (250 mg total) by mouth 2 (two) times daily with a meal. Patient taking differently: Take 250 mg by mouth 2 (two) times daily with a meal. Taking 1 tablet twice daily due to pain 10/11/17   Jerrol Banana., MD  nitrofurantoin, macrocrystal-monohydrate, (MACROBID) 100 MG capsule Take 1 capsule (100 mg total) by mouth 2 (two) times daily. 10/11/17   Jerrol Banana., MD  omeprazole (PRILOSEC) 40 MG capsule Take 1 capsule (40 mg total) by mouth 2 (two) times daily with a meal. 10/01/18 10/31/18  Earleen Newport, MD  omeprazole (PRILOSEC) 40 MG capsule Take 1 capsule (40 mg total) by mouth 2 (two) times daily before lunch and supper. 10/01/18 10/31/18  Earleen Newport, MD  oxyCODONE-acetaminophen (PERCOCET) 7.5-325 MG tablet Take 1 tablet by mouth every 6 (six) hours as needed for severe pain. 04/26/18   Sable Feil,  PA-C  predniSONE (DELTASONE) 10 MG tablet Take 6 tablets on day 1, take 5 tablets on day 2, take 4 tablets on day 3, take 3 tablets on day 4, take 2 tablets on day 5, take 1 tablet on day 6 12/12/17   Laban Emperor, PA-C  rosuvastatin (CRESTOR) 10 MG tablet Take 1 tablet (10 mg total) by mouth daily. 01/30/18   Tawni Millers, MD  sucralfate (CARAFATE) 1 g tablet Take 1 tablet (1 g total) by mouth 4 (four) times daily as needed. 10/01/18 10/01/19  Merlyn Lot, MD  sucralfate (CARAFATE) 1 g tablet Take 1 tablet (1 g total) by mouth 4 (four) times daily. 10/01/18 10/01/19  Earleen Newport, MD  sulfamethoxazole-trimethoprim (BACTRIM DS,SEPTRA DS) 800-160 MG tablet Take 1 tablet by mouth 2 (two) times daily. 04/26/18   Sable Feil, PA-C  traZODone (DESYREL) 100 MG tablet Take 1 tablet by mouth at bedtime. (Failure to attend appointment on 07/13/20 will  result in titrating off this medication) 06/24/20     traZODone (DESYREL) 50 MG tablet Take 1 tablet (50 mg total) by mouth at bedtime as needed. Patient taking differently: Take 50 mg by mouth at bedtime as needed. Prescribed 1 1/2 - 3 tablets at bedtime. Take 2 tablets. 01/11/15   [name], MD  Vitamin D, Ergocalciferol, (DRISDOL) 50000 units CAPS capsule Take 1 capsule (50,000 Units total) by mouth every 7 (seven) days. 09/06/17   Erlene Quan, NP    Allergies Patient has no known allergies.  Family History  Problem Relation Age of Onset   Hypertension Mother    Heart disease Mother    Hypertension Father    Stroke Father    Heart disease Father     Social History Social History   Tobacco Use   Smoking status: Former    Years: 10.00    Pack years: 0.00    Types: Cigarettes    Quit date: 1996    Years since quitting: 26.5   Smokeless tobacco: Former    Types: Snuff  Vaping Use   Vaping Use: Never used  Substance Use Topics   Alcohol use: No   Drug use: No    Review of Systems Constitutional: No  fever/chills Eyes: No visual changes. ENT: No sore throat. Cardiovascular: Denies chest pain. Respiratory: Denies shortness of breath. Gastrointestinal: See HPI Genitourinary: Negative for dysuria. Musculoskeletal: Negative for back pain although at times the discomfort will radiate slightly towards her left flank. Skin: Negative for rash. Neurological: Negative for headaches, areas of focal weakness or numbness.    ____________________________________________   PHYSICAL EXAM:  VITAL SIGNS: ED Triage Vitals [09/20/20 1621]  Enc Vitals Group     BP (!) 142/98     Pulse Rate (!) 114     Resp 18     Temp 98.4 F (36.9 C)     Temp Source Oral     SpO2 100 %     Weight 220 lb (99.8 kg)     Height 5\' 2"  (1.575 m)     Head Circumference      Peak Flow      Pain Score 8     Pain Loc      Pain Edu?      Excl. in [place]?     Constitutional: Alert and oriented. Well appearing and in no acute distress. Eyes: Conjunctivae are normal. Head: Atraumatic. Nose: No congestion/rhinnorhea. Mouth/Throat: Mucous membranes are moist. Neck: No stridor.  Cardiovascular: Normal rate, regular rhythm. Grossly normal heart sounds.  Good peripheral circulation. Respiratory: Normal respiratory effort.  No retractions. Lungs CTAB. Gastrointestinal: Soft and nontender she does report mild tenderness to palpation left lower quadrant without rebound or guarding.. No distention.  No CVA tenderness bilateral. Musculoskeletal: No lower extremity tenderness nor edema. Neurologic:  Normal speech and language. No gross focal neurologic deficits are appreciated.  Skin:  Skin is warm, dry and intact. No rash noted. Psychiatric: Mood and affect are normal. Speech and behavior are normal.  ____________________________________________   LABS (all labs ordered are listed, but only abnormal results are displayed)  Labs Reviewed  COMPREHENSIVE METABOLIC PANEL - Abnormal; Notable for the following components:       Result Value   Sodium 132 (*)    CO2 15 (*)    Glucose, Bld 324 (*)    Total Protein 8.3 (*)    All other components within normal limits  CBC - Abnormal; Notable for the following  components:   WBC 12.8 (*)    All other components within normal limits  BLOOD GAS, VENOUS - Abnormal; Notable for the following components:   pCO2, Ven 35 (*)    pO2, Ven 61.0 (*)    Bicarbonate 19.8 (*)    Acid-base deficit 4.9 (*)    All other components within normal limits  URINALYSIS, COMPLETE (UACMP) WITH MICROSCOPIC - Abnormal; Notable for the following components:   Color, Urine STRAW (*)    APPearance CLEAR (*)    Specific Gravity, Urine >1.046 (*)    Glucose, UA >=500 (*)    Hgb urine dipstick LARGE (*)    All other components within normal limits  LIPASE, BLOOD  BETA-HYDROXYBUTYRIC ACID  POC URINE PREG, ED  ____________________________________________  RADIOLOGY  CT ABDOMEN PELVIS W CONTRAST  Result Date: 09/20/2020 CLINICAL DATA:  Left flank and left lower quadrant pain for 1 month. Nausea and diarrhea. Urinary frequency. EXAM: CT ABDOMEN AND PELVIS WITH CONTRAST TECHNIQUE: Multidetector CT imaging of the abdomen and pelvis was performed using the standard protocol following bolus administration of intravenous contrast. CONTRAST:  120mL OMNIPAQUE IOHEXOL 300 MG/ML  SOLN COMPARISON:  Noncontrast CT on 12/08/2014 FINDINGS: Lower Chest: No acute findings. Hepatobiliary: No hepatic masses identified. Gallbladder is unremarkable. No evidence of biliary ductal dilatation. Pancreas:  No mass or inflammatory changes. Spleen: Within normal limits in size and appearance. Adrenals/Urinary Tract: No masses identified. No evidence of ureteral calculi or hydronephrosis. Stomach/Bowel: No evidence of obstruction, inflammatory process or abnormal fluid collections. Vascular/Lymphatic: No pathologically enlarged lymph nodes. No acute vascular findings. Reproductive: Mildly enlarged uterus with lobular contour,  likely due to presence of small uterine fibroids. IUD is seen in abnormal location in the lower uterine segment. Adnexal regions are unremarkable. Other:  None. Musculoskeletal:  No suspicious bone lesions identified. IMPRESSION: No acute findings. Mildly enlarged uterus, with probable small fibroids. IUD in abnormal location in lower uterine segment. Electronically Signed   By: Marlaine Hind M.D.   On: 09/20/2020 18:21   US PELVIC COMPLETE W TRANSVAGINAL AND TORSION R/O  Result Date: 09/20/2020 CLINICAL DATA:  Left flank pain x1 month, IUD in lower uterine segment on CT EXAM: TRANSABDOMINAL AND TRANSVAGINAL ULTRASOUND OF PELVIS TECHNIQUE: Both transabdominal and transvaginal ultrasound examinations of the pelvis were performed. Transabdominal technique was performed for global imaging of the pelvis including uterus, ovaries, adnexal regions, and pelvic cul-de-sac. It was necessary to proceed with endovaginal exam following the transabdominal exam to visualize the endometrium and bilateral adnexa. COMPARISON:  None. FINDINGS: Uterus Measurements: 10.5 x 6.3 x 7.4 cm = volume: 254 mL. Dominant 3.4 x 2.4 x 2.4 cm intramural fibroid in the posterior uterine body. Endometrium Thickness: 5 mm.  IUD in the lower uterine segment. Right ovary Not discretely visualized.  No adnexal mass is seen. Left ovary Not discretely visualized.  No adnexal mass is seen. Doppler evaluation could not be performed due to nonvisualization of the bilateral ovaries. Other findings No abnormal free fluid. IMPRESSION: IUD in the lower uterine segment. 3.4 cm intramural fibroid in the posterior uterine body. No adnexal mass is seen. Electronically Signed   By: Julian Hy M.D.   On: 09/20/2020 23:01     CT imaging reviewed no acute findings.  Possible small fibroid.  IUD is noted to be in lower uterine segment.  Transvaginal ultrasound reassuring, fibroid is noted.  No adnexal masses.  The ovaries were not visualized well.  However,  no adnexal masses, CT imaging also did  not demonstrate any acute adnexal findings and pretest probability for acute gynecologic process low.  Doubt torsion.  Patient resting comfortably without pain or discomfort at 11:20 PM ____________________________________________   PROCEDURES  Procedure(s) performed: None  Procedures  Critical Care performed: No  ____________________________________________   INITIAL IMPRESSION / ASSESSMENT AND PLAN / ED COURSE  Pertinent labs & imaging results that were available during my care of the patient were reviewed by me and considered in my medical decision making (see chart for details).   Differential diagnosis includes but is not limited to, abdominal perforation, aortic dissection, cholecystitis, appendicitis, diverticulitis, colitis, esophagitis/gastritis, kidney stone, pyelonephritis, urinary tract infection, aortic aneurysm. All are considered in decision and treatment plan. Based upon the patient's presentation and risk factors, in association with left lower abdominal discomfort loose stools will obtain CT imaging to evaluate for etiologies possibly GI, feel less likely ovarian in nature.  Reports her symptoms to be associated with loose stools left flank discomfort and seem to be recurrent always involving episodes of loose stools.  She has no obvious evidence of infection noted.  No bloody stools.  No travel history.  Minimal leukocytosis.  Her clinical history and pain along with a loose stool association does not seem consistent with acute ovarian torsion.   Clinical Course as of 09/20/20 2322  Mon Sep 20, 2020  2128 Patient resting comfortably.  Urinalysis clean.  Lab work reassuring.  Had a diminished CO2 but I suspect this may be secondary to GI losses.  Her pH is normal and no elevation of beta hydroxy.  No evidence of DKA [MQ]  2129 Anion gap is normal. [MQ]    Clinical Course User Index [MQ] Delman Kitten, MD   Patient's transvaginal  ultrasound imaging is negative for acute etiology, would entertain this may be some type of a colitis.  CT does not demonstrate obvious diverticulitis or colitis, however given her symptoms of flank discomfort left lower rib discomfort, and association with loose stools on a recurrent basis over the last month and a half, would recommend follow-up with gastroenterology with traditional abdominal pain return precautions.  Discussed results with patient patient comfortable with plan for discharge, recommendation to follow-up with her OB/GYN physician as well as gastroenterology. ----------------------------------------- 11:27 PM on 09/20/2020 ----------------------------------------- Patient resting comfortably pain-free.  Reports when her symptoms do occur she has very watery loose stools, but all symptoms are now resolved no further stooling since arriving to the ER  Return precautions and treatment recommendations and follow-up discussed with the patient who is agreeable with the plan.   ____________________________________________   FINAL CLINICAL IMPRESSION(S) / ED DIAGNOSES  Final diagnoses:  Left flank pain  Colitis        Note:  This document was prepared using Dragon voice recognition software and may include unintentional dictation errors       Delman Kitten, MD 09/20/20 2327

## 2020-09-20 NOTE — ED Triage Notes (Signed)
Pt via POV from home. Pt c/o LLQ pain that radiates to her L flank pt states that been going on intermittently for 1 month and a half. Pt also c/o urinary frequency. Pt admits to nausea and diarrhea that started today. Denies fever. Pt has hx of c-section and appendectomy. Pt is A&Ox4 and NAD. Ambulatory to triage.

## 2020-09-23 ENCOUNTER — Ambulatory Visit: Payer: Medicaid Other | Admitting: Gastroenterology

## 2020-10-06 ENCOUNTER — Other Ambulatory Visit: Payer: Self-pay

## 2020-10-06 MED ORDER — ROSUVASTATIN CALCIUM 10 MG PO TABS
ORAL_TABLET | ORAL | 3 refills | Status: DC
  Filled 2020-10-06: qty 90, 90d supply, fill #0

## 2020-10-08 ENCOUNTER — Other Ambulatory Visit: Payer: Self-pay

## 2020-10-08 MED ORDER — DOXYCYCLINE HYCLATE 100 MG PO CAPS
ORAL_CAPSULE | ORAL | 0 refills | Status: DC
  Filled 2020-10-08: qty 20, 10d supply, fill #0

## 2020-10-21 ENCOUNTER — Other Ambulatory Visit: Payer: Self-pay

## 2020-10-21 MED ORDER — FIFTY50 PEN NEEDLES 31G X 8 MM MISC: 11 refills | Status: DC

## 2020-10-21 MED ORDER — FIFTY50 GLUCOSE METER 2.0 W/DEVICE KIT: PACK | 2 refills | Status: DC

## 2020-10-21 MED ORDER — KROGER BLOOD GLUCOSE TEST VI STRP: ORAL_STRIP | 11 refills | Status: DC

## 2020-11-01 ENCOUNTER — Other Ambulatory Visit: Payer: Self-pay

## 2020-11-21 DIAGNOSIS — K047 Periapical abscess without sinus: Secondary | ICD-10-CM | POA: Insufficient documentation

## 2020-11-24 ENCOUNTER — Other Ambulatory Visit: Payer: Self-pay

## 2020-11-24 ENCOUNTER — Encounter: Payer: Self-pay | Admitting: Gastroenterology

## 2020-11-24 ENCOUNTER — Ambulatory Visit (INDEPENDENT_AMBULATORY_CARE_PROVIDER_SITE_OTHER): Payer: Medicaid Other | Admitting: Gastroenterology

## 2020-11-24 VITALS — BP 124/83 | HR 75 | Temp 98.4°F | Ht 62.0 in | Wt 227.0 lb

## 2020-11-24 DIAGNOSIS — R109 Unspecified abdominal pain: Secondary | ICD-10-CM

## 2020-11-24 DIAGNOSIS — R195 Other fecal abnormalities: Secondary | ICD-10-CM

## 2020-11-24 MED ORDER — OMEPRAZOLE 40 MG PO CPDR: 40.0000 mg | DELAYED_RELEASE_CAPSULE | Freq: Every day | ORAL | 0 refills | Status: DC

## 2020-11-24 MED ORDER — NA SULFATE-K SULFATE-MG SULF 17.5-3.13-1.6 GM/177ML PO SOLN: 354.0000 mL | Freq: Once | ORAL | 0 refills | Status: AC

## 2020-11-24 NOTE — Patient Instructions (Signed)
High-Fiber Eating Plan °Fiber, also called dietary fiber, is a type of carbohydrate. It is found foods such as fruits, vegetables, whole grains, and beans. A high-fiber diet can have many health benefits. Your health care provider may recommend a high-fiber diet to help: °Prevent constipation. Fiber can make your bowel movements more regular. °Lower your cholesterol. °Relieve the following conditions: °Inflammation of veins in the anus (hemorrhoids). °Inflammation of specific areas of the digestive tract (uncomplicated diverticulosis). °A problem of the large intestine, also called the colon, that sometimes causes pain and diarrhea (irritable bowel syndrome, or IBS). °Prevent overeating as part of a weight-loss plan. °Prevent heart disease, type 2 diabetes, and certain cancers. °What are tips for following this plan? °Reading food labels ° °Check the nutrition facts label on food products for the amount of dietary fiber. Choose foods that have 5 grams of fiber or more per serving. °The goals for recommended daily fiber intake include: °Men (age 50 or younger): 34-38 g. °Men (over age 50): 28-34 g. °Women (age 50 or younger): 25-28 g. °Women (over age 50): 22-25 g. °Your daily fiber goal is _____________ g. °Shopping °Choose whole fruits and vegetables instead of processed forms, such as apple juice or applesauce. °Choose a wide variety of high-fiber foods such as avocados, lentils, oats, and kidney beans. °Read the nutrition facts label of the foods you choose. Be aware of foods with added fiber. These foods often have high sugar and sodium amounts per serving. °Cooking °Use whole-grain flour for baking and cooking. °Cook with brown rice instead of white rice. °Meal planning °Start the day with a breakfast that is high in fiber, such as a cereal that contains 5 g of fiber or more per serving. °Eat breads and cereals that are made with whole-grain flour instead of refined flour or white flour. °Eat brown rice, bulgur  wheat, or millet instead of white rice. °Use beans in place of meat in soups, salads, and pasta dishes. °Be sure that half of the grains you eat each day are whole grains. °General information °You can get the recommended daily intake of dietary fiber by: °Eating a variety of fruits, vegetables, grains, nuts, and beans. °Taking a fiber supplement if you are not able to take in enough fiber in your diet. It is better to get fiber through food than from a supplement. °Gradually increase how much fiber you consume. If you increase your intake of dietary fiber too quickly, you may have bloating, cramping, or gas. °Drink plenty of water to help you digest fiber. °Choose high-fiber snacks, such as berries, raw vegetables, nuts, and popcorn. °What foods should I eat? °Fruits °Berries. Pears. Apples. Oranges. Avocado. Prunes and raisins. Dried figs. °Vegetables °Sweet potatoes. Spinach. Kale. Artichokes. Cabbage. Broccoli. Cauliflower. Green peas. Carrots. Squash. °Grains °Whole-grain breads. Multigrain cereal. Oats and oatmeal. Brown rice. Barley. Bulgur wheat. Millet. Quinoa. Bran muffins. Popcorn. Rye wafer crackers. °Meats and other proteins °Navy beans, kidney beans, and pinto beans. Soybeans. Split peas. Lentils. Nuts and seeds. °Dairy °Fiber-fortified yogurt. °Beverages °Fiber-fortified soy milk. Fiber-fortified orange juice. °Other foods °Fiber bars. °The items listed above may not be a complete list of recommended foods and beverages. Contact a dietitian for more information. °What foods should I avoid? °Fruits °Fruit juice. Cooked, strained fruit. °Vegetables °Fried potatoes. Canned vegetables. Well-cooked vegetables. °Grains °White bread. Pasta made with refined flour. White rice. °Meats and other proteins °Fatty cuts of meat. Fried chicken or fried fish. °Dairy °Milk. Yogurt. Cream cheese. Sour cream. °Fats and   oils °Butters. °Beverages °Soft drinks. °Other foods °Cakes and pastries. °The items listed above may  not be a complete list of foods and beverages to avoid. Talk with your dietitian about what choices are best for you. °Summary °Fiber is a type of carbohydrate. It is found in foods such as fruits, vegetables, whole grains, and beans. °A high-fiber diet has many benefits. It can help to prevent constipation, lower blood cholesterol, aid weight loss, and reduce your risk of heart disease, diabetes, and certain cancers. °Increase your intake of fiber gradually. Increasing fiber too quickly may cause cramping, bloating, and gas. Drink plenty of water while you increase the amount of fiber you consume. °The best sources of fiber include whole fruits and vegetables, whole grains, nuts, seeds, and beans. °This information is not intended to replace advice given to you by your health care provider. Make sure you discuss any questions you have with your health care provider. °Document Revised: 07/10/2019 Document Reviewed: 07/10/2019 °Elsevier Patient Education © 2022 [employer] ° °

## 2020-11-24 NOTE — Progress Notes (Signed)
Jonathon Bellows MD, MRCP(U.K) [address]  [address]  [place], [place] [address]  Main: [phone]  Fax: [phone]   Gastroenterology Consultation  Referring Provider:     Delman Kitten, MD Primary Care Physician:  Patient, No Pcp Per (Inactive) Primary Gastroenterologist:  Dr. Jonathon Bellows  Reason for Consultation:     Gastroenteritis        HPI:   Michelle Warner is a 45 y.o. y/o female referred for gastroenteritis.  She presented to the emergency room on 09/20/2020 with abdominal pain going on for over 6 weeks accompanied by loose watery stools.  Referred to see me as an outpatient for loose stools. She states that for over a year she has been having epigastric discomfort not related to any food intake no clear aggravating factors no clear relieving factors denies any NSAID use pain is localized nonradiating sharp in nature occurs at least once a week.  Denies any weight loss rather weight gain.  Daily bowel movements soft in nature.  Denies any early satiety.  She says that right after she eats she has a very watery bowel movements few times a day ongoing only for a year.  No family history of colon cancer.  No prior GI evaluation but she recollects having colonoscopy many years back when she saw her GI doctor.  She says she is under a lot of stress at times. 10/28/2020: HbA1c 8.0 10/01/2020 hemoglobin 11.8 g with an MCV of 86 CMP normal LFTs creatinine of 0.66  09/20/2020 CT abdomen pelvis with contrast mildly enlarged uterus IUD in abnormal location in lower uterine segment. 09/20/2020 ultrasound of the pelvis showed IUD and fibroid of the uterus.   Past Medical History:  Diagnosis Date   Anxiety    Appendicitis    Depression    Diabetes mellitus without complication ([place])    Heart murmur    Hypercholesterolemia    Hypertension     Past Surgical History:  Procedure Laterality Date   APPENDECTOMY     CESAREAN SECTION     LAPAROSCOPIC APPENDECTOMY N/A 12/08/2014    Procedure: APPENDECTOMY LAPAROSCOPIC;  Surgeon: Sherri Rad, MD;  Location: [facility] ORS;  Service: General;  Laterality: N/A;   TONSILLECTOMY      Prior to Admission medications   Medication Sig Start Date End Date Taking? Authorizing Provider  acetaminophen (TYLENOL) 500 MG tablet Take 1,000 mg by mouth at bedtime. For back, leg and knee pain   Yes [provider]  albuterol (PROVENTIL HFA;VENTOLIN HFA) 108 (90 Base) MCG/ACT inhaler Inhale 1-2 puffs into the lungs every 6 (six) hours as needed for wheezing or shortness of breath. 04/12/17  Yes Doles-Johnson, Teah, NP  amoxicillin (AMOXIL) 500 MG capsule Take 500 mg by mouth 2 (two) times daily. 11/18/20  Yes [provider]  Blood Glucose Monitoring Suppl (FIFTY50 GLUCOSE METER 2.0) w/Device KIT Disp. blood glucose meter kit preferred by patient's insurance. Check blood sugars as directed by provider. Dx: Diabetes, E11.9 10/21/20  Yes   cetirizine (ZYRTEC) 10 MG tablet TAKE ONE TABLET BY MOUTH EVERY DAY 02/07/18  Yes Doles-Johnson, Teah, NP  citalopram (CELEXA) 20 MG tablet Take 1 tablet by mouth daily. (Failure to attend appt on 07/13/20 will result in titrating off the medication) 06/24/20  Yes   fluticasone (FLONASE) 50 MCG/ACT nasal spray Place 2 sprays into both nostrils daily. 01/30/18  Yes Tawni Millers, MD  gabapentin (NEURONTIN) 300 MG capsule Take 1 capsule (300 mg total) by mouth  at bedtime. 01/30/18  Yes Tawni Millers, MD  glipiZIDE (GLUCOTROL) 10 MG tablet TAKE ONE TABLET BY MOUTH 2 TIMES A DAY BEFORE A MEAL 01/30/18  Yes Tawni Millers, MD  glucose blood ([employer] BLOOD GLUCOSE TEST) test strip Disp test strips preferred by insurance plan. Testing twice daily. Dx:E11.9,Z79.4 10/21/20  Yes   hydrochlorothiazide (MICROZIDE) 12.5 MG capsule TAKE ONE TABLET BY MOUTH EVERY DAY 07/27/17  Yes Doles-Johnson, Teah, NP  ibuprofen (ADVIL,MOTRIN) 600 MG tablet Take 1 tablet (600 mg total) by mouth every 8 (eight) hours as needed. 04/26/18  Yes  Sable Feil, PA-C  insulin detemir (LEVEMIR) 100 UNIT/ML injection Take 20 units in the morning and 20 units at night 01/30/18  Yes Chaplin, Dianah Field, MD  Insulin Pen Needle (FIFTY50 PEN NEEDLES) 31G X 8 MM MISC Injection Frequency is 1 time per day; Dx Code: Type 2 Diabetes uncontrolled (E11.65) 10/21/20  Yes   lisinopril (PRINIVIL,ZESTRIL) 20 MG tablet Take 1 tablet (20 mg total) by mouth daily. 01/30/18  Yes Tawni Millers, MD  naproxen (NAPROSYN) 250 MG tablet Take 1 tablet (250 mg total) by mouth 2 (two) times daily with a meal. Patient taking differently: Take 250 mg by mouth 2 (two) times daily with a meal. Taking 1 tablet twice daily due to pain 10/11/17  Yes Jerrol Banana., MD  oxyCODONE-acetaminophen (PERCOCET) 7.5-325 MG tablet Take 1 tablet by mouth every 6 (six) hours as needed for severe pain. 04/26/18  Yes Sable Feil, PA-C  predniSONE (DELTASONE) 10 MG tablet Take 6 tablets on day 1, take 5 tablets on day 2, take 4 tablets on day 3, take 3 tablets on day 4, take 2 tablets on day 5, take 1 tablet on day 6 12/12/17  Yes Laban Emperor, PA-C  rosuvastatin (CRESTOR) 10 MG tablet Take 1 tablet (10 mg total) by mouth daily. 01/30/18  Yes Tawni Millers, MD  traZODone (DESYREL) 100 MG tablet Take 1 tablet by mouth at bedtime. (Failure to attend appointment on 07/13/20 will result in titrating off this medication) 06/24/20  Yes   Vitamin D, Ergocalciferol, (DRISDOL) 50000 units CAPS capsule Take 1 capsule (50,000 Units total) by mouth every 7 (seven) days. 09/06/17  Yes Tukov-Yual, Arlyss Gandy, NP    Family History  Problem Relation Age of Onset   Hypertension Mother    Heart disease Mother    Hypertension Father    Stroke Father    Heart disease Father      Social History   Tobacco Use   Smoking status: Former    Years: 10.00    Types: Cigarettes    Quit date: 1996    Years since quitting: 26.7   Smokeless tobacco: Former    Types: Snuff  Vaping Use   Vaping Use: Never  used  Substance Use Topics   Alcohol use: No   Drug use: No    Allergies as of 11/24/2020 - Review Complete 11/24/2020  Allergen Reaction Noted   Metformin Diarrhea 06/27/2019    Review of Systems:    All systems reviewed and negative except where noted in HPI.   Physical Exam:  BP 124/83   Pulse 75   Temp 98.4 F (36.9 C) (Oral)   Ht '5\' 2"'  (1.575 m)   Wt 227 lb (103 kg)   BMI 41.52 kg/m  No LMP recorded. (Menstrual status: IUD). Psych:  Alert and cooperative. Normal mood and affect. General:   Alert,  Well-developed, well-nourished, pleasant  and cooperative in NAD Head:  Normocephalic and atraumatic. Eyes:  Sclera clear, no icterus.   Conjunctiva pink. Ears:  Normal auditory acuity.. Lungs:  Respirations even and unlabored.  Clear throughout to auscultation.   No wheezes, crackles, or rhonchi. No acute distress. Heart:  Regular rate and rhythm; no murmurs, clicks, rubs, or gallops. Abdomen:  Normal bowel sounds.  No bruits.  Soft, non-tender and non-distended without masses, hepatosplenomegaly or hernias noted.  No guarding or rebound tenderness.    Neurologic:  Alert and oriented x3;  grossly normal neurologically. Psych:  Alert and cooperative. Normal mood and affect.  Imaging Studies: No results found.  Assessment and Plan:   Michelle Warner is a 45 y.o. y/o female has been referred for loose stools after she presented to the emergency room in July 2022.  Differential diagnosis includes peptic ulcer disease versus irritable bowel syndrome with diarrhea versus functional diarrhea.  Plan 1.  H. pylori breath test 2.  Celiac serology 3.  Stool test tract infection 4.  EGD and colonoscopy 5.  If loose stools not better will need to give her a course of Xifaxan versus Creon for IBS diarrhea/EPI 6.  Advised to stop all artificial sugars and sweeteners in her diet which she consumes on a regular basis to avoid carbohydrate intolerance.   I have discussed alternative  options, risks & benefits,  which include, but are not limited to, bleeding, infection, perforation,respiratory complication & drug reaction.  The patient agrees with this plan & written consent will be obtained.     Follow up in 8 to 12 weeks  Dr Jonathon Bellows MD,MRCP(U.K)

## 2020-11-25 LAB — H. PYLORI BREATH TEST: H pylori Breath Test: NEGATIVE

## 2020-11-28 LAB — CELIAC DISEASE AB SCREEN W/RFX
Antigliadin Abs, IgA: 4 units (ref 0–19)
IgA/Immunoglobulin A, Serum: 250 mg/dL (ref 87–352)
Transglutaminase IgA: <2 U/mL (ref 0–3)

## 2020-12-24 ENCOUNTER — Encounter: Admission: RE | Payer: Self-pay | Source: Home / Self Care

## 2020-12-24 ENCOUNTER — Encounter: Payer: Self-pay | Admitting: Anesthesiology

## 2020-12-24 ENCOUNTER — Ambulatory Visit: Admission: RE | Admit: 2020-12-24 | Payer: Medicaid Other | Source: Home / Self Care | Admitting: Gastroenterology

## 2020-12-24 SURGERY — COLONOSCOPY WITH PROPOFOL
Anesthesia: General

## 2021-02-15 ENCOUNTER — Other Ambulatory Visit: Payer: Self-pay

## 2021-02-22 ENCOUNTER — Other Ambulatory Visit: Payer: Self-pay

## 2021-02-23 ENCOUNTER — Encounter: Payer: Self-pay | Admitting: Gastroenterology

## 2021-02-23 ENCOUNTER — Ambulatory Visit: Payer: Medicaid Other | Admitting: Gastroenterology

## 2021-02-23 NOTE — Progress Notes (Incomplete)
Jonathon Bellows MD, MRCP(U.K) [address]  [place]  [place], [place] [address]  Main: [phone]  Fax: [phone]   Primary Care Physician: Patient, No Pcp Per (Inactive)  Primary Gastroenterologist:  Dr. Jonathon Bellows   Chief complaint: Follow-up for loose stools and abdominal pain   HPI: Michelle Warner is a 45 y.o. female   Summary of history :  Initially referred and seen on 11/24/2020 for loose stools and abdominal pain suggestive of gastroenteritis.She presented to the emergency room on 09/20/2020 with abdominal pain going on for over 6 weeks accompanied by loose watery stools.    She states that for over a year she has been having epigastric discomfort not related to any food intake no clear aggravating factors no clear relieving factors denies any NSAID use pain is localized nonradiating sharp in nature occurs at least once a week.  Denies any weight loss rather weight gain.  Daily bowel movements soft in nature.  Denies any early satiety.  She says that right after she eats she has a very watery bowel movements few times a day ongoing only for a year.  No family history of colon cancer.  No prior GI evaluation but she recollects having colonoscopy many years back when she saw her GI doctor.  She says she is under a lot of stress at times.   10/28/2020: HbA1c 8.0 10/01/2020 hemoglobin 11.8 g with an MCV of 86 CMP normal LFTs creatinine of 0.66  09/20/2020 CT abdomen pelvis with contrast mildly enlarged uterus IUD in abnormal location in lower uterine segment. 09/20/2020 ultrasound of the pelvis showed IUD and fibroid of the uterus.  Interval history   11/24/2020-02/23/2021  11/24/2020: Celiac serology negative, H. pylori breath test negative Stool test were ordered for diarrhea but were not done.  I recommended EGD and colonoscopy that was canceled.She is consuming a lot of artificial sugars and sweeteners which I advised her to stop  ***   Current Outpatient Medications   Medication Sig Dispense Refill   acetaminophen (TYLENOL) 500 MG tablet Take 1,000 mg by mouth at bedtime. For back, leg and knee pain     albuterol (PROVENTIL HFA;VENTOLIN HFA) 108 (90 Base) MCG/ACT inhaler Inhale 1-2 puffs into the lungs every 6 (six) hours as needed for wheezing or shortness of breath. 1 Inhaler 3   amoxicillin (AMOXIL) 500 MG capsule Take 500 mg by mouth 2 (two) times daily.     Blood Glucose Monitoring Suppl (FIFTY50 GLUCOSE METER 2.0) w/Device KIT Disp. blood glucose meter kit preferred by patient's insurance. Check blood sugars as directed by provider. Dx: Diabetes, E11.9 1 kit 2   cetirizine (ZYRTEC) 10 MG tablet TAKE ONE TABLET BY MOUTH EVERY DAY 30 tablet 0   citalopram (CELEXA) 20 MG tablet Take 1 tablet by mouth daily. (Failure to attend appt on 07/13/20 will result in titrating off the medication) 19 tablet 0   clonazePAM (KLONOPIN) 1 MG tablet Take 1 tablet by mouth 3 (three) times daily as needed.     FLOVENT HFA 110 MCG/ACT inhaler SMARTSIG:2 Puff(s) By Mouth Twice Daily     fluticasone (FLONASE) 50 MCG/ACT nasal spray Place 2 sprays into both nostrils daily. 16 g 0   gabapentin (NEURONTIN) 300 MG capsule Take 1 capsule by mouth 3 (three) times daily.     glipiZIDE (GLUCOTROL) 10 MG tablet TAKE ONE TABLET BY MOUTH 2 TIMES A DAY BEFORE A MEAL 120 tablet 3   glucose blood ([employer] BLOOD GLUCOSE TEST)  test strip Disp test strips preferred by insurance plan. Testing twice daily. Dx:E11.9,Z79.4 100 strip 11   hydrochlorothiazide (MICROZIDE) 12.5 MG capsule TAKE ONE TABLET BY MOUTH EVERY DAY 90 capsule 0   ibuprofen (ADVIL,MOTRIN) 600 MG tablet Take 1 tablet (600 mg total) by mouth every 8 (eight) hours as needed. 15 tablet 0   Insulin Pen Needle (FIFTY50 PEN NEEDLES) 31G X 8 MM MISC Injection Frequency is 1 time per day; Dx Code: Type 2 Diabetes uncontrolled (E11.65) 100 each 11   LANTUS SOLOSTAR 100 UNIT/ML Solostar Pen Inject 30 Units into the skin at bedtime.      lisinopril (ZESTRIL) 20 MG tablet Take 1 tablet by mouth daily.     naproxen (NAPROSYN) 250 MG tablet Take 1 tablet (250 mg total) by mouth 2 (two) times daily with a meal. (Patient taking differently: Take 250 mg by mouth 2 (two) times daily with a meal. Taking 1 tablet twice daily due to pain) 60 tablet 0   omeprazole (PRILOSEC) 20 MG capsule Take 1 capsule by mouth 2 (two) times daily.     oxyCODONE-acetaminophen (PERCOCET) 7.5-325 MG tablet Take 1 tablet by mouth every 6 (six) hours as needed for severe pain. 12 tablet 0   predniSONE (DELTASONE) 10 MG tablet Take 6 tablets on day 1, take 5 tablets on day 2, take 4 tablets on day 3, take 3 tablets on day 4, take 2 tablets on day 5, take 1 tablet on day 6 21 tablet 0   rosuvastatin (CRESTOR) 10 MG tablet Take 1 tablet (10 mg total) by mouth daily. 90 tablet 3   traZODone (DESYREL) 100 MG tablet Take 1 tablet by mouth at bedtime. (Failure to attend appointment on 07/13/20 will result in titrating off this medication) 19 tablet 0   TRULICITY 6.31 SH/7.43YO SOPN SMARTSIG:1 Pre-Filled Pen Syringe SUB-Q Once a Week     Vitamin D, Ergocalciferol, (DRISDOL) 50000 units CAPS capsule Take 1 capsule (50,000 Units total) by mouth every 7 (seven) days. 5 capsule 5   No current facility-administered medications for this visit.    Allergies as of 02/23/2021 - Review Complete 11/24/2020  Allergen Reaction Noted   Metformin Diarrhea 06/27/2019    ROS:  General: Negative for anorexia, weight loss, fever, chills, fatigue, weakness. ENT: Negative for hoarseness, difficulty swallowing , nasal congestion. CV: Negative for chest pain, angina, palpitations, dyspnea on exertion, peripheral edema.  Respiratory: Negative for dyspnea at rest, dyspnea on exertion, cough, sputum, wheezing.  GI: See history of present illness. GU:  Negative for dysuria, hematuria, urinary incontinence, urinary frequency, nocturnal urination.  Endo: Negative for unusual weight change.     Physical Examination:   There were no vitals taken for this visit.  General: Well-nourished, well-developed in no acute distress.  Eyes: No icterus. Conjunctivae pink. Mouth: Oropharyngeal mucosa moist and pink , no lesions erythema or exudate. Lungs: Clear to auscultation bilaterally. Non-labored. Heart: Regular rate and rhythm, no murmurs rubs or gallops.  Abdomen: Bowel sounds are normal, nontender, nondistended, no hepatosplenomegaly or masses, no abdominal bruits or hernia , no rebound or guarding.   Extremities: No lower extremity edema. No clubbing or deformities. Neuro: Alert and oriented x 3.  Grossly intact. Skin: Warm and dry, no jaundice.   Psych: Alert and cooperative, normal mood and affect.   Imaging Studies: No results found.  Assessment and Plan:   Michelle Warner is a 46 y.o. y/o female ***  Michelle Warner is a 45 y.o. y/o female  has been referred for loose stools after she presented to the emergency room in July 2022.  Differential diagnosis includes peptic ulcer disease versus irritable bowel syndrome with diarrhea versus functional diarrhea.   Plan 1.  H. pylori breath test 2.  Celiac serology 3.  Stool test tract infection 4.  EGD and colonoscopy 5.  If loose stools not better will need to give her a course of Xifaxan versus Creon for IBS diarrhea/EPI 6.  Advised to stop all artificial sugars and sweeteners in her diet which she consumes on a regular basis to avoid carbohydrate intolerance.    Dr Jonathon Bellows  MD,MRCP [facility]) Follow up in ***

## 2021-04-28 ENCOUNTER — Other Ambulatory Visit: Payer: Self-pay

## 2021-05-05 ENCOUNTER — Ambulatory Visit (INDEPENDENT_AMBULATORY_CARE_PROVIDER_SITE_OTHER): Payer: Medicaid Other | Admitting: Nurse Practitioner

## 2021-05-05 ENCOUNTER — Other Ambulatory Visit: Payer: Self-pay

## 2021-05-05 ENCOUNTER — Encounter: Payer: Self-pay | Admitting: Nurse Practitioner

## 2021-05-05 VITALS — BP 102/69 | HR 91 | Temp 98.2°F | Ht 62.0 in | Wt 225.4 lb

## 2021-05-05 DIAGNOSIS — Z975 Presence of (intrauterine) contraceptive device: Secondary | ICD-10-CM

## 2021-05-05 DIAGNOSIS — R809 Proteinuria, unspecified: Secondary | ICD-10-CM

## 2021-05-05 DIAGNOSIS — E1129 Type 2 diabetes mellitus with other diabetic kidney complication: Secondary | ICD-10-CM | POA: Diagnosis not present

## 2021-05-05 DIAGNOSIS — E1159 Type 2 diabetes mellitus with other circulatory complications: Secondary | ICD-10-CM

## 2021-05-05 DIAGNOSIS — F419 Anxiety disorder, unspecified: Secondary | ICD-10-CM

## 2021-05-05 DIAGNOSIS — Z7689 Persons encountering health services in other specified circumstances: Secondary | ICD-10-CM

## 2021-05-05 DIAGNOSIS — E113293 Type 2 diabetes mellitus with mild nonproliferative diabetic retinopathy without macular edema, bilateral: Secondary | ICD-10-CM | POA: Diagnosis not present

## 2021-05-05 DIAGNOSIS — E1142 Type 2 diabetes mellitus with diabetic polyneuropathy: Secondary | ICD-10-CM

## 2021-05-05 DIAGNOSIS — J4530 Mild persistent asthma, uncomplicated: Secondary | ICD-10-CM

## 2021-05-05 DIAGNOSIS — K219 Gastro-esophageal reflux disease without esophagitis: Secondary | ICD-10-CM

## 2021-05-05 DIAGNOSIS — J302 Other seasonal allergic rhinitis: Secondary | ICD-10-CM

## 2021-05-05 DIAGNOSIS — G43009 Migraine without aura, not intractable, without status migrainosus: Secondary | ICD-10-CM

## 2021-05-05 DIAGNOSIS — Z794 Long term (current) use of insulin: Secondary | ICD-10-CM

## 2021-05-05 DIAGNOSIS — E538 Deficiency of other specified B group vitamins: Secondary | ICD-10-CM

## 2021-05-05 DIAGNOSIS — G4733 Obstructive sleep apnea (adult) (pediatric): Secondary | ICD-10-CM

## 2021-05-05 DIAGNOSIS — I152 Hypertension secondary to endocrine disorders: Secondary | ICD-10-CM

## 2021-05-05 DIAGNOSIS — E559 Vitamin D deficiency, unspecified: Secondary | ICD-10-CM

## 2021-05-05 DIAGNOSIS — Z114 Encounter for screening for human immunodeficiency virus [HIV]: Secondary | ICD-10-CM

## 2021-05-05 DIAGNOSIS — E785 Hyperlipidemia, unspecified: Secondary | ICD-10-CM

## 2021-05-05 DIAGNOSIS — E1169 Type 2 diabetes mellitus with other specified complication: Secondary | ICD-10-CM

## 2021-05-05 LAB — MICROALBUMIN, URINE WAIVED
Creatinine, Urine Waived: 200 mg/dL (ref 10–300)
Microalb, Ur Waived: 150 mg/L — ABNORMAL HIGH (ref 0–19)

## 2021-05-05 LAB — BAYER DCA HB A1C WAIVED: HB A1C (BAYER DCA - WAIVED): 9.1 % — ABNORMAL HIGH (ref 4.8–5.6)

## 2021-05-05 MED ORDER — DAPAGLIFLOZIN PROPANEDIOL 10 MG PO TABS: 10.0000 mg | ORAL_TABLET | Freq: Every day | ORAL | 12 refills | Status: DC

## 2021-05-05 MED ORDER — LORATADINE 10 MG PO TABS: 10.0000 mg | ORAL_TABLET | Freq: Every day | ORAL | 4 refills | Status: DC

## 2021-05-05 MED ORDER — TRULICITY 1.5 MG/0.5ML ~~LOC~~ SOAJ: 1.5000 mg | SUBCUTANEOUS | 4 refills | Status: DC

## 2021-05-05 NOTE — Assessment & Plan Note (Signed)
Chronic, ongoing.  Is followed by [facility] at this time, continue this collaboration.  She is aware all mental health medications will need to be refilled by them.  Denies SI/HI.

## 2021-05-05 NOTE — Assessment & Plan Note (Signed)
Chronic, ongoing with BP at goal today.  Currently on Lisinopril for kidney protection and BP, may benefit from switch to ARB in future due to her underlying asthma.  Recommend she monitor BP at least a few mornings a week at home and document.  DASH diet at home.  Labs today: CBC, CMP, TSH, urine ALB.  Return in 3 months.

## 2021-05-05 NOTE — Assessment & Plan Note (Signed)
Chronic, ongoing.  A1c 9.1% today and urine ALB 150.  At this time continue Lisinopril for kidney protection.  Adjust medication regimen - increase Trulicity to 1.5 MG weekly and start Farxiga 10 MG daily with goal in future to reduce to discontinue insulin, discussed with patient.  At this time continue Glipizide 10 MG BID and reduce in future if able + continue Lantus 30 units, but discussed with her that with medication changes if lows present she is to reduce this by 3 units every 3 days if needed = if BS in morning <130 consistently.  Check BS TID and document for visits.  Return in 5 weeks.

## 2021-05-05 NOTE — Assessment & Plan Note (Signed)
Chronic, ongoing.  Will send in Claritin, which has offered her benefit in past, and continue Flonase.  Referral to ENT for allergy testing.

## 2021-05-05 NOTE — Assessment & Plan Note (Signed)
Chronic, ongoing.  A1c 9.1% today and urine ALB 150.  Continue Gabapentin as ordered at this time and check CMP.  Adjust medication regimen - increase Trulicity to 1.5 MG weekly and start Farxiga 10 MG daily with goal in future to reduce to discontinue insulin, discussed with patient.  At this time continue Glipizide 10 MG BID and reduce in future if able + continue Lantus 30 units, but discussed with her that with medication changes if lows present she is to reduce this by 3 units every 3 days if needed = if BS in morning <130 consistently.  Check BS TID and document for visits.  Return in 5 weeks.

## 2021-05-05 NOTE — Assessment & Plan Note (Signed)
Chronic, ongoing, and stable with inhaler use at this time.  Continue current inhaler regimen as ordered and adjust as needed.  Plan on spirometry at future visit.  Would benefit from modest weight loss.  Labs today: CBC.  Referral to ENT for allergy testing.

## 2021-05-05 NOTE — Assessment & Plan Note (Signed)
Chronic, ongoing.  Continue current medication regimen and adjust as needed.  CMP and lipid panel today. 

## 2021-05-05 NOTE — Assessment & Plan Note (Signed)
Referral to GYN for further assessment.

## 2021-05-05 NOTE — Assessment & Plan Note (Signed)
Chronic, ongoing, taking weekly supplement.  Continue this supplement and adjust as needed. Vit D level today. 

## 2021-05-05 NOTE — Assessment & Plan Note (Signed)
BMI 41.23 with T2DM, HLD, HTN.  Recommended eating smaller high protein, low fat meals more frequently and exercising 30 mins a day 5 times a week with a goal of 10-15lb weight loss in the next 3 months. Patient voiced their understanding and motivation to adhere to these recommendations.

## 2021-05-05 NOTE — Assessment & Plan Note (Signed)
Chronic, ongoing with no current prescription treatment.  Minimal migraines present, continue OTC therapy and adjust as needed based on severity and frequency of migraines.

## 2021-05-05 NOTE — Progress Notes (Signed)
New Patient Office Visit  Subjective:  Patient ID: Michelle Warner, female    DOB: Apr 24, 1975  Age: 46 y.o. MRN: [id number]  CC:  Chief Complaint  Patient presents with   Establish Care    Patient is here to establish care.    Blurred Vision    Patient states she has been having issues with blurred vision in her right eye. Patient states this has been ongoing issue. Patient states when she went to the eye doctor in the past they informed her she had diabetic retinopathy. Patient states she has a hard time seeing out her right eye. Patient would like to discuss treatment options.    Referral    Patient states she would like a referral to GYN to discuss issues with her current IUD.     HPI Michelle Warner presents for new patient visit to establish care.  Introduced to Designer, jewellery role and practice setting.  All questions answered.  Discussed provider/patient relationship and expectations.  Has been followed by [facility], not seen since August 2022.  Has IUD in place and would like referral to GYN to have checked.  DIABETES Diagnosed in her 49's.  Last A1c was 8% on 10/28/20.  Currently taking Glipizide 10 MG BID, Lantus 30 units at bedtime, Trulicity 3.32 MG weekly.  Continues on Gabapentin for neuropathy pain to both feet, R>L.  Metformin in the past caused GI issues.  Does not recall taking SGLT2. Hypoglycemic episodes:no Polydipsia/polyuria: no Visual disturbance: yes Chest pain: no Paresthesias: no Glucose Monitoring: yes  Accucheck frequency: Daily  Fasting glucose: 119 to 239  Post prandial:  Evening:  Before meals: Taking Insulin?: yes  Long acting insulin: Lantus 30 units  Short acting insulin: Blood Pressure Monitoring: not checking Retinal Examination: Up to Date -- about 2-3 months ago and was told diabetic retinopathy was present -- they did not order treatment Foot Exam: Up to Date Pneumovax:  refuses Influenza:  refuses Aspirin: no   GERD Continues on  Omeprazole daily.  Was supposed to had another EGD, but did not. GERD control status: stable Satisfied with current treatment? yes Heartburn frequency:  Medication side effects: no  Medication compliance: stable Dysphagia: no Odynophagia:  no Hematemesis: no Blood in stool: no EGD: yes   ASTHMA Diagnosed in adulthood.  As history of sleep study and was told no need for CPAP.  Using inhalers Flovent and Albuterol as needed.  Also Flonase for allergies.  She reports she used to be able to eat eggs and now can eat nothing with eggs, would like to be sent for allergy testing.  Asthma status: stable Satisfied with current treatment?: yes Albuterol/rescue inhaler frequency: none Dyspnea frequency: none Wheezing frequency: none Cough frequency: none Nocturnal symptom frequency: none Limitation of activity: no Current upper respiratory symptoms: no Last Spirometry: unknown Failed/intolerant to following asthma meds:  Asthma meds in past: unknown Aerochamber/spacer use: no Visits to ER or Urgent Care in past year: no Pneumovax:  refuses Influenza:  refuses  HYPERTENSION / HYPERLIPIDEMIA Continues on Lisinopril and Rosuvastatin for this. Satisfied with current treatment? yes Duration of hypertension: chronic BP monitoring frequency: not checking BP range:  BP medication side effects: no Past BP meds:  Duration of hyperlipidemia: chronic Cholesterol medication side effects: no Cholesterol supplements: none Past cholesterol medications:  Medication compliance: good compliance Aspirin: no Recent stressors: no Recurrent headaches: no Visual changes: no Palpitations: no Dyspnea: no Chest pain: no Lower extremity edema: no Dizzy/lightheaded: no  MIGRAINES For several years, but well controlled without prescription medications. Duration: chronic Onset: gradual Severity: moderate Quality: dull, aching, and throbbing Frequency: intermittent Location: temples Headache  duration: 24 hours Radiation: no Time of day headache occurs: varies Alleviating factors: Ibuprofen Aggravating factors: unknown Headache status at time of visit: asymptomatic Treatments attempted: Treatments attempted: rest, APAP, and ibuprofen   Aura: no Nausea:  no Vomiting: no Photophobia:  yes Phonophobia:  yes Effect on social functioning:  no Numbers of missed days of school/work each month: none Confusion:  no Gait disturbance/ataxia:  no Behavioral changes:  no Fevers:  no   ANXIETY/STRESS She does attend [facility] for psychiatry -- continues Trazodone for sleep and Celexa 20 MG daily. Duration:stable Anxious mood: no  Excessive worrying: no Irritability: no  Sweating: no Nausea: no Palpitations:no Hyperventilation: no Panic attacks: no Agoraphobia: no  Obscessions/compulsions: no Depressed mood: no Depression screen [facility] 2/9 05/05/2021 01/15/2018 07/18/2017  Decreased Interest 0 0 1  Down, Depressed, Hopeless 0 2 2  PHQ - 2 Score 0 2 3  Altered sleeping _0 Tired, decreased energy _1 Change in appetite 0 1 1  Feeling bad or failure about yourself  0 0 3  Trouble concentrating 0 1 1  Moving slowly or fidgety/restless 0 3 2  Suicidal thoughts 0 0 0  PHQ-9 Score _2 Difficult doing work/chores - Somewhat difficult Somewhat difficult  Anhedonia: no Weight changes: no Insomnia: yes hard to stay asleep  Hypersomnia: no Fatigue/loss of energy: no Feelings of worthlessness: no Feelings of guilt: no Impaired concentration/indecisiveness: no Suicidal ideations: no  Crying spells: no Recent Stressors/Life Changes: no   Relationship problems: no   Family stress: no     Financial stress: no    Job stress: no    Recent death/loss: no   Past Medical History:  Diagnosis Date   Anxiety    Appendicitis    Depression    Diabetes mellitus without complication (HCC)    Heart murmur    Hypercholesterolemia    Hypertension     Past Surgical History:   Procedure Laterality Date   APPENDECTOMY     CESAREAN SECTION     LAPAROSCOPIC APPENDECTOMY N/A 12/08/2014   Procedure: APPENDECTOMY LAPAROSCOPIC;  Surgeon: Sherri Rad, MD;  Location: [facility] ORS;  Service: General;  Laterality: N/A;   TONSILLECTOMY      Family History  Problem Relation Age of Onset   Hypertension Mother    Heart disease Mother    Hypertension Father    Stroke Father    Heart disease Father     Social History   Socioeconomic History   Marital status: Married    Spouse name: Not on file   Number of children: Not on file   Years of education: Not on file   Highest education level: Not on file  Occupational History   Not on file  Tobacco Use   Smoking status: Former    Years: 10.00    Types: Cigarettes    Quit date: 1996    Years since quitting: 27.1   Smokeless tobacco: Former    Types: Snuff  Vaping Use   Vaping Use: Never used  Substance and Sexual Activity   Alcohol use: No   Drug use: No   Sexual activity: Not on file  Other Topics Concern   Not on file  Social History Narrative   Not on file   Social Determinants of Health   Financial  Resource Strain: Low Risk    Difficulty of Paying Living Expenses: Not hard at all  Food Insecurity: No Food Insecurity   Worried About Charity fundraiser in the Last Year: Never true   Ran Out of Food in the Last Year: Never true  Transportation Needs: No Transportation Needs   Lack of Transportation (Medical): No   Lack of Transportation (Non-Medical): No  Physical Activity: Inactive   Days of Exercise per Week: 0 days   Minutes of Exercise per Session: 0 min  Stress: No Stress Concern Present   Feeling of Stress : Not at all  Social Connections: Moderately Isolated   Frequency of Communication with Friends and Family: More than three times a week   Frequency of Social Gatherings with Friends and Family: More than three times a week   Attends Religious Services: Never   Marine scientist or  Organizations: No   Attends Music therapist: Never   Marital Status: Married  Human resources officer Violence: Not At Risk   Fear of Current or Ex-Partner: No   Emotionally Abused: No   Physically Abused: No   Sexually Abused: No   ROS Review of Systems  Constitutional:  Negative for activity change, appetite change, diaphoresis, fatigue and fever.  Eyes:  Positive for visual disturbance.  Respiratory:  Negative for cough, chest tightness, shortness of breath and wheezing.   Cardiovascular:  Negative for chest pain, palpitations and leg swelling.  Gastrointestinal: Negative.   Endocrine: Negative for cold intolerance, heat intolerance, polydipsia, polyphagia and polyuria.  Neurological: Negative.   Psychiatric/Behavioral: Negative.     Objective:   Today's Vitals: BP 102/69    Pulse 91    Temp 98.2 F (36.8 C)    Ht _0  (1.575 m)    Wt 225 lb 6.4 oz (102.2 kg)    SpO2 98%    BMI 41.23 kg/m   Physical Exam Vitals and nursing note reviewed.  Constitutional:      General: She is awake. She is not in acute distress.    Appearance: She is well-developed and well-groomed. She is obese. She is not ill-appearing or toxic-appearing.  HENT:     Head: Normocephalic.     Right Ear: Hearing normal.     Left Ear: Hearing normal.  Eyes:     General: Lids are normal.        Right eye: No discharge.        Left eye: No discharge.     Extraocular Movements: Extraocular movements intact.     Conjunctiva/sclera: Conjunctivae normal.     Pupils: Pupils are equal, round, and reactive to light.     Funduscopic exam:    Right eye: No hemorrhage.        Left eye: No hemorrhage.     Visual Fields: Right eye visual fields normal and left eye visual fields normal.  Neck:     Thyroid: No thyromegaly.     Vascular: No carotid bruit.  Cardiovascular:     Rate and Rhythm: Normal rate and regular rhythm.     Heart sounds: Normal heart sounds. No murmur heard.   No gallop.  Pulmonary:      Effort: Pulmonary effort is normal. No accessory muscle usage or respiratory distress.     Breath sounds: Normal breath sounds.  Abdominal:     General: Bowel sounds are normal.     Palpations: Abdomen is soft.  Musculoskeletal:     Cervical back: Normal  range of motion and neck supple.     Right lower leg: No edema.     Left lower leg: No edema.  Lymphadenopathy:     Head:     Right side of head: No submental, submandibular, tonsillar, preauricular or posterior auricular adenopathy.     Left side of head: No submental, submandibular, tonsillar, preauricular or posterior auricular adenopathy.     Cervical: No cervical adenopathy.  Skin:    General: Skin is warm and dry.  Neurological:     Mental Status: She is alert and oriented to person, place, and time.  Psychiatric:        Attention and Perception: Attention normal.        Mood and Affect: Mood normal.        Speech: Speech normal.        Behavior: Behavior normal. Behavior is cooperative.        Thought Content: Thought content normal.   Diabetic Foot Exam - Simple   Simple Foot Form Visual Inspection No deformities, no ulcerations, no other skin breakdown bilaterally: Yes Sensation Testing See comments: Yes Pulse Check Posterior Tibialis and Dorsalis pulse intact bilaterally: Yes Comments Sensation 7/10 right and 8/10 left.     Assessment & Plan:   Problem List Items Addressed This Visit       Cardiovascular and Mediastinum   Hypertension associated with diabetes ([place])    Chronic, ongoing with BP at goal today.  Currently on Lisinopril for kidney protection and BP, may benefit from switch to ARB in future due to her underlying asthma.  Recommend she monitor BP at least a few mornings a week at home and document.  DASH diet at home.  Labs today: CBC, CMP, TSH, urine ALB.  Return in 3 months.       Relevant Medications   Dulaglutide (TRULICITY) 1.5 AS/6.0RV SOPN   dapagliflozin propanediol (FARXIGA) 10 MG TABS  tablet   Other Relevant Orders   Bayer DCA Hb A1c Waived (Completed)   Microalbumin, Urine Waived (Completed)   Migraine without aura and responsive to treatment    Chronic, ongoing with no current prescription treatment.  Minimal migraines present, continue OTC therapy and adjust as needed based on severity and frequency of migraines.        Respiratory   Allergic rhinitis, seasonal    Chronic, ongoing.  Will send in Claritin, which has offered her benefit in past, and continue Flonase.  Referral to ENT for allergy testing.      Relevant Orders   Ambulatory referral to ENT   Mild persistent asthma without complication    Chronic, ongoing, and stable with inhaler use at this time.  Continue current inhaler regimen as ordered and adjust as needed.  Plan on spirometry at future visit.  Would benefit from modest weight loss.  Labs today: CBC.  Referral to ENT for allergy testing.      Relevant Orders   Ambulatory referral to ENT   Obstructive sleep apnea of adult    Reports she was told she did not need CPAP, but to focus on weight loss -- may benefit repeat study in future.        Digestive   Gastro-esophageal reflux disease without esophagitis    Chronic, stable with Prilosec.  May need referral to GI in future for repeat EGD, which she was to have.  At this time continue PPI and trial reduction in future, if poor tolerance continue current treatment.  Mag level today.  Relevant Orders   Magnesium     Endocrine   Hyperlipidemia associated with type 2 diabetes mellitus (HCC)    Chronic, ongoing.  Continue current medication regimen and adjust as needed.  CMP and lipid panel today.      Relevant Medications   Dulaglutide (TRULICITY) 1.5 KG/4.0NU SOPN   dapagliflozin propanediol (FARXIGA) 10 MG TABS tablet   Other Relevant Orders   Bayer DCA Hb A1c Waived (Completed)   Comprehensive metabolic panel   Lipid Panel w/o Chol/HDL Ratio   Type 2 diabetes mellitus with both  eyes affected by mild nonproliferative retinopathy without macular edema, with long-term current use of insulin (HCC)    Chronic, ongoing with poor control at this time, A1c 9.1%.  Referral to local ophthalmology.  Adjust medication regimen - increase Trulicity to 1.5 MG weekly and start Farxiga 10 MG daily with goal in future to reduce to discontinue insulin, discussed with patient.  At this time continue Glipizide 10 MG BID and reduce in future if able + continue Lantus 30 units, but discussed with her that with medication changes if lows present she is to reduce this by 3 units every 3 days if needed = if BS in morning <130 consistently.  Check BS TID and document for visits.  Return in 5 weeks.      Relevant Medications   Dulaglutide (TRULICITY) 1.5 UV/2.5DG SOPN   dapagliflozin propanediol (FARXIGA) 10 MG TABS tablet   Other Relevant Orders   Ambulatory referral to Ophthalmology   Type 2 diabetes mellitus with diabetic polyneuropathy, with long-term current use of insulin (HCC) - Primary    Chronic, ongoing.  A1c 9.1% today and urine ALB 150.  Continue Gabapentin as ordered at this time and check CMP.  Adjust medication regimen - increase Trulicity to 1.5 MG weekly and start Farxiga 10 MG daily with goal in future to reduce to discontinue insulin, discussed with patient.  At this time continue Glipizide 10 MG BID and reduce in future if able + continue Lantus 30 units, but discussed with her that with medication changes if lows present she is to reduce this by 3 units every 3 days if needed = if BS in morning <130 consistently.  Check BS TID and document for visits.  Return in 5 weeks.      Relevant Medications   Dulaglutide (TRULICITY) 1.5 UY/4.0HK SOPN   dapagliflozin propanediol (FARXIGA) 10 MG TABS tablet   Other Relevant Orders   Bayer DCA Hb A1c Waived (Completed)   Microalbumin, Urine Waived (Completed)   CBC with Differential/Platelet   Lipid Panel w/o Chol/HDL Ratio   TSH   Type 2  diabetes mellitus with proteinuria (HCC)    Chronic, ongoing.  A1c 9.1% today and urine ALB 150.  At this time continue Lisinopril for kidney protection.  Adjust medication regimen - increase Trulicity to 1.5 MG weekly and start Farxiga 10 MG daily with goal in future to reduce to discontinue insulin, discussed with patient.  At this time continue Glipizide 10 MG BID and reduce in future if able + continue Lantus 30 units, but discussed with her that with medication changes if lows present she is to reduce this by 3 units every 3 days if needed = if BS in morning <130 consistently.  Check BS TID and document for visits.  Return in 5 weeks.      Relevant Medications   Dulaglutide (TRULICITY) 1.5 VQ/2.5ZD SOPN   dapagliflozin propanediol (FARXIGA) 10 MG TABS tablet  Other   Anxiety    Chronic, ongoing.  Is followed by [facility] at this time, continue this collaboration.  She is aware all mental health medications will need to be refilled by them.  Denies SI/HI.      IUD (intrauterine device) in place    Referral to GYN for further assessment.      Relevant Orders   Ambulatory referral to Gynecology   Morbid obesity ([place])    BMI 41.23 with T2DM, HLD, HTN.  Recommended eating smaller high protein, low fat meals more frequently and exercising 30 mins a day 5 times a week with a goal of 10-15lb weight loss in the next 3 months. Patient voiced their understanding and motivation to adhere to these recommendations.       Relevant Medications   Dulaglutide (TRULICITY) 1.5 JG/8.1LX SOPN   dapagliflozin propanediol (FARXIGA) 10 MG TABS tablet   Vitamin D deficiency    Chronic, ongoing, taking weekly supplement.  Continue this supplement and adjust as needed. Vit D level today.      Relevant Orders   VITAMIN D 25 Hydroxy (Vit-D Deficiency, Fractures)   Other Visit Diagnoses     B12 deficiency       History of low levels, check today and start supplement as needed.   Relevant Orders   Vitamin  B12   Encounter for screening for HIV       HIV screening on labs today, discussed with patient.   Relevant Orders   HIV Antibody (routine testing w rflx)   Encounter to establish care       New to clinic, introduced to provider and clinic setting.       Outpatient Encounter Medications as of 05/05/2021  Medication Sig   acetaminophen (TYLENOL) 500 MG tablet Take 1,000 mg by mouth at bedtime. For back, leg and knee pain   albuterol (PROVENTIL HFA;VENTOLIN HFA) 108 (90 Base) MCG/ACT inhaler Inhale 1-2 puffs into the lungs every 6 (six) hours as needed for wheezing or shortness of breath.   Blood Glucose Monitoring Suppl (FIFTY50 GLUCOSE METER 2.0) w/Device KIT Disp. blood glucose meter kit preferred by patient's insurance. Check blood sugars as directed by provider. Dx: Diabetes, E11.9   citalopram (CELEXA) 20 MG tablet Take 1 tablet by mouth daily. (Failure to attend appt on 07/13/20 will result in titrating off the medication)   dapagliflozin propanediol (FARXIGA) 10 MG TABS tablet Take 1 tablet (10 mg total) by mouth daily before breakfast.   Dulaglutide (TRULICITY) 1.5 BW/6.2MB SOPN Inject 1.5 mg into the skin once a week.   FLOVENT HFA 110 MCG/ACT inhaler SMARTSIG:2 Puff(s) By Mouth Twice Daily   fluticasone (FLONASE) 50 MCG/ACT nasal spray Place 2 sprays into both nostrils daily.   gabapentin (NEURONTIN) 300 MG capsule Take 1 capsule by mouth 3 (three) times daily.   glipiZIDE (GLUCOTROL) 10 MG tablet TAKE ONE TABLET BY MOUTH 2 TIMES A DAY BEFORE A MEAL   glucose blood ([employer] BLOOD GLUCOSE TEST) test strip Disp test strips preferred by insurance plan. Testing twice daily. Dx:E11.9,Z79.4   ibuprofen (ADVIL,MOTRIN) 600 MG tablet Take 1 tablet (600 mg total) by mouth every 8 (eight) hours as needed.   Insulin Pen Needle (FIFTY50 PEN NEEDLES) 31G X 8 MM MISC Injection Frequency is 1 time per day; Dx Code: Type 2 Diabetes uncontrolled (E11.65)   LANTUS SOLOSTAR 100 UNIT/ML Solostar Pen  Inject 30 Units into the skin at bedtime.   lisinopril (ZESTRIL) 20 MG tablet Take 1 tablet  by mouth daily.   loratadine (CLARITIN) 10 MG tablet Take 1 tablet (10 mg total) by mouth daily.   omeprazole (PRILOSEC) 20 MG capsule Take 1 capsule by mouth 2 (two) times daily.   rosuvastatin (CRESTOR) 10 MG tablet Take 1 tablet (10 mg total) by mouth daily.   traZODone (DESYREL) 100 MG tablet Take 1 tablet by mouth at bedtime. (Failure to attend appointment on 07/13/20 will result in titrating off this medication)   Vitamin D, Ergocalciferol, (DRISDOL) 50000 units CAPS capsule Take 1 capsule (50,000 Units total) by mouth every 7 (seven) days.   [DISCONTINUED] clonazePAM (KLONOPIN) 1 MG tablet Take 1 tablet by mouth 3 (three) times daily as needed.   [DISCONTINUED] naproxen (NAPROSYN) 250 MG tablet Take 1 tablet (250 mg total) by mouth 2 (two) times daily with a meal. (Patient taking differently: Take 250 mg by mouth 2 (two) times daily with a meal. Taking 1 tablet twice daily due to pain)   [DISCONTINUED] oxyCODONE-acetaminophen (PERCOCET) 7.5-325 MG tablet Take 1 tablet by mouth every 6 (six) hours as needed for severe pain.   [DISCONTINUED] TRULICITY 4.70 JG/2.8ZM SOPN SMARTSIG:1 Pre-Filled Pen Syringe SUB-Q Once a Week   [DISCONTINUED] amoxicillin (AMOXIL) 500 MG capsule Take 500 mg by mouth 2 (two) times daily. (Patient not taking: Reported on 05/05/2021)   [DISCONTINUED] cetirizine (ZYRTEC) 10 MG tablet TAKE ONE TABLET BY MOUTH EVERY DAY (Patient not taking: Reported on 05/05/2021)   [DISCONTINUED] hydrochlorothiazide (MICROZIDE) 12.5 MG capsule TAKE ONE TABLET BY MOUTH EVERY DAY (Patient not taking: Reported on 05/05/2021)   [DISCONTINUED] predniSONE (DELTASONE) 10 MG tablet Take 6 tablets on day 1, take 5 tablets on day 2, take 4 tablets on day 3, take 3 tablets on day 4, take 2 tablets on day 5, take 1 tablet on day 6 (Patient not taking: Reported on 05/05/2021)   No facility-administered encounter  medications on file as of 05/05/2021.    Follow-up: Return in about 5 weeks (around 06/09/2021) for Diabetes.   Venita Lick, NP

## 2021-05-05 NOTE — Assessment & Plan Note (Signed)
Reports she was told she did not need CPAP, but to focus on weight loss -- may benefit repeat study in future. 

## 2021-05-05 NOTE — Patient Instructions (Signed)

## 2021-05-05 NOTE — Assessment & Plan Note (Signed)
Chronic, ongoing with poor control at this time, A1c 9.1%.  Referral to local ophthalmology.  Adjust medication regimen - increase Trulicity to 1.5 MG weekly and start Farxiga 10 MG daily with goal in future to reduce to discontinue insulin, discussed with patient.  At this time continue Glipizide 10 MG BID and reduce in future if able + continue Lantus 30 units, but discussed with her that with medication changes if lows present she is to reduce this by 3 units every 3 days if needed = if BS in morning <130 consistently.  Check BS TID and document for visits.  Return in 5 weeks.

## 2021-05-05 NOTE — Assessment & Plan Note (Signed)
Chronic, stable with Prilosec.  May need referral to GI in future for repeat EGD, which she was to have.  At this time continue PPI and trial reduction in future, if poor tolerance continue current treatment.  Mag level today.

## 2021-05-06 LAB — COMPREHENSIVE METABOLIC PANEL
ALT: 12 IU/L (ref 0–32)
AST: 11 IU/L (ref 0–40)
Albumin/Globulin Ratio: 1.6 (ref 1.2–2.2)
Albumin: 4.4 g/dL (ref 3.8–4.8)
Alkaline Phosphatase: 82 IU/L (ref 44–121)
BUN/Creatinine Ratio: 25 — ABNORMAL HIGH (ref 9–23)
BUN: 17 mg/dL (ref 6–24)
Bilirubin Total: 0.3 mg/dL (ref 0.0–1.2)
CO2: 23 mmol/L (ref 20–29)
Calcium: 9.5 mg/dL (ref 8.7–10.2)
Chloride: 99 mmol/L (ref 96–106)
Creatinine, Ser: 0.67 mg/dL (ref 0.57–1.00)
Globulin, Total: 2.8 g/dL (ref 1.5–4.5)
Glucose: 234 mg/dL — ABNORMAL HIGH (ref 70–99)
Potassium: 4.7 mmol/L (ref 3.5–5.2)
Sodium: 139 mmol/L (ref 134–144)
Total Protein: 7.2 g/dL (ref 6.0–8.5)
eGFR: 110 mL/min/{1.73_m2} (ref >59)

## 2021-05-06 LAB — CBC WITH DIFFERENTIAL/PLATELET
Basophils Absolute: 0.1 10*3/uL (ref 0.0–0.2)
Basos: 1 %
EOS (ABSOLUTE): 0.1 10*3/uL (ref 0.0–0.4)
Eos: 1 %
Hematocrit: 41.8 % (ref 34.0–46.6)
Hemoglobin: 13.9 g/dL (ref 11.1–15.9)
Immature Grans (Abs): 0 10*3/uL (ref 0.0–0.1)
Immature Granulocytes: 0 %
Lymphocytes Absolute: 2.4 10*3/uL (ref 0.7–3.1)
Lymphs: 25 %
MCH: 29.7 pg (ref 26.6–33.0)
MCHC: 33.3 g/dL (ref 31.5–35.7)
MCV: 89 fL (ref 79–97)
Monocytes Absolute: 0.5 10*3/uL (ref 0.1–0.9)
Monocytes: 5 %
Neutrophils Absolute: 6.6 10*3/uL (ref 1.4–7.0)
Neutrophils: 68 %
Platelets: 331 10*3/uL (ref 150–450)
RBC: 4.68 x10E6/uL (ref 3.77–5.28)
RDW: 12.1 % (ref 11.7–15.4)
WBC: 9.8 10*3/uL (ref 3.4–10.8)

## 2021-05-06 LAB — LIPID PANEL W/O CHOL/HDL RATIO
Cholesterol, Total: 155 mg/dL (ref 100–199)
HDL: 40 mg/dL (ref >39)
LDL Chol Calc (NIH): 75 mg/dL (ref 0–99)
Triglycerides: 245 mg/dL — ABNORMAL HIGH (ref 0–149)
VLDL Cholesterol Cal: 40 mg/dL (ref 5–40)

## 2021-05-06 LAB — VITAMIN B12: Vitamin B-12: 354 pg/mL (ref 232–1245)

## 2021-05-06 LAB — HIV ANTIBODY (ROUTINE TESTING W REFLEX): HIV Screen 4th Generation wRfx: NONREACTIVE

## 2021-05-06 LAB — TSH: TSH: 2.57 u[IU]/mL (ref 0.450–4.500)

## 2021-05-06 LAB — VITAMIN D 25 HYDROXY (VIT D DEFICIENCY, FRACTURES): Vit D, 25-Hydroxy: 23.7 ng/mL — ABNORMAL LOW (ref 30.0–100.0)

## 2021-05-06 LAB — MAGNESIUM: Magnesium: 1.7 mg/dL (ref 1.6–2.3)

## 2021-05-06 MED ORDER — EMPAGLIFLOZIN 25 MG PO TABS: 25.0000 mg | ORAL_TABLET | Freq: Every day | ORAL | 4 refills | Status: DC

## 2021-05-06 NOTE — Progress Notes (Signed)
Contacted via Altha afternoon Antonya your labs have returned:  - Kidney function, creatinine and eGFR, remains normal, as is liver function, AST and ALT.  Thyroid level is normal. - Cholesterol levels show LDL at goal, continue Rosuvastatin. - Vitamin D level is slightly low, please ensure you are taking Vitamin D3 2000 units daily.  This is good for bone health. - B12 level is on low side of normal -- recommend you take B12 1000 MCG daily for this -- to help nervous system health. Remainder of labs stable -- I had to change [place] to [place] due to coverage in regard to new medications I sent in for diabetes.  Any questions? Keep being amazing!!  Thank you for allowing me to participate in your care.  I appreciate you. Kindest regards, Goble Fudala

## 2021-05-06 NOTE — Addendum Note (Signed)
Addended by: Marnee Guarneri T on: 05/06/2021 11:39 AM   Modules accepted: Orders

## 2021-05-09 ENCOUNTER — Telehealth: Payer: Self-pay

## 2021-05-09 NOTE — Telephone Encounter (Signed)
Prior Authorization was initiated via [employer] via telephone at [phone] for prescription Jardiance 25 MG tablet. Patient was approved for medication via [employer] representative.   Reference # [id number]  Approved from 05/09/21 until 05/04/22.   PA # [id number].

## 2021-05-12 ENCOUNTER — Telehealth: Payer: Self-pay

## 2021-05-12 NOTE — Telephone Encounter (Signed)
[facility] referring for for IUD in place with discomfort. Any provider. Called and left voicemail for patient to call back to be scheduled.

## 2021-05-13 NOTE — Telephone Encounter (Signed)
Called and left voicemail for patient to call back to be scheduled. 

## 2021-05-16 NOTE — Telephone Encounter (Signed)
I contacted patient via phone. The phone rang  twice. There was answer but no "hello'' followed by disconnection.

## 2021-05-18 NOTE — Telephone Encounter (Signed)
Patient requested Friday morning. Patient is scheduled with JEG on 06/17/21

## 2021-06-05 DIAGNOSIS — G63 Polyneuropathy in diseases classified elsewhere: Secondary | ICD-10-CM | POA: Insufficient documentation

## 2021-06-05 DIAGNOSIS — E538 Deficiency of other specified B group vitamins: Secondary | ICD-10-CM | POA: Insufficient documentation

## 2021-06-05 NOTE — Patient Instructions (Signed)

## 2021-06-10 ENCOUNTER — Ambulatory Visit (INDEPENDENT_AMBULATORY_CARE_PROVIDER_SITE_OTHER): Payer: Medicaid Other | Admitting: Nurse Practitioner

## 2021-06-10 ENCOUNTER — Encounter: Payer: Self-pay | Admitting: Nurse Practitioner

## 2021-06-10 ENCOUNTER — Other Ambulatory Visit: Payer: Self-pay

## 2021-06-10 VITALS — BP 116/76 | HR 76 | Temp 97.5°F | Wt 224.6 lb

## 2021-06-10 DIAGNOSIS — E538 Deficiency of other specified B group vitamins: Secondary | ICD-10-CM

## 2021-06-10 DIAGNOSIS — Z794 Long term (current) use of insulin: Secondary | ICD-10-CM

## 2021-06-10 DIAGNOSIS — E1142 Type 2 diabetes mellitus with diabetic polyneuropathy: Secondary | ICD-10-CM

## 2021-06-10 DIAGNOSIS — D500 Iron deficiency anemia secondary to blood loss (chronic): Secondary | ICD-10-CM

## 2021-06-10 DIAGNOSIS — E1129 Type 2 diabetes mellitus with other diabetic kidney complication: Secondary | ICD-10-CM

## 2021-06-10 DIAGNOSIS — E559 Vitamin D deficiency, unspecified: Secondary | ICD-10-CM

## 2021-06-10 MED ORDER — EMPAGLIFLOZIN 25 MG PO TABS: 25.0000 mg | ORAL_TABLET | Freq: Every day | ORAL | 4 refills | Status: DC

## 2021-06-10 MED ORDER — VITAMIN B-12 1000 MCG PO TABS: 1000.0000 ug | ORAL_TABLET | Freq: Every day | ORAL | 4 refills | Status: DC

## 2021-06-10 MED ORDER — LORATADINE 10 MG PO TABS: 10.0000 mg | ORAL_TABLET | Freq: Every day | ORAL | 4 refills | Status: DC

## 2021-06-10 MED ORDER — TRULICITY 1.5 MG/0.5ML ~~LOC~~ SOAJ: 1.5000 mg | SUBCUTANEOUS | 4 refills | Status: DC

## 2021-06-10 NOTE — Assessment & Plan Note (Signed)
Chronic, ongoing, taking weekly supplement.  Continue this supplement and adjust as needed. Vit D level next visit 

## 2021-06-10 NOTE — Progress Notes (Signed)
? ?BP 116/76   Pulse 76   Temp (!) 97.5 ?F (36.4 ?C) (Oral)   Wt 224 lb 9.6 oz (101.9 kg)   SpO2 98%   BMI 41.08 kg/m?   ? ?Subjective:  ? ? Patient ID: Michelle Warner, female    DOB: 1975-09-14, 46 y.o.   MRN: [id number] ? ?HPI: ?Michelle Warner is a 46 y.o. female ? ?Chief Complaint  ?Patient presents with  ? Diabetes  ? Anemia  ? ?DIABETES ?Diagnosed in her 52's.  Last A1c was 9.1% recent visit and Jardiance was ordered, but has not started -- did no pick up.  Currently taking Glipizide 10 MG BID, Lantus 30 units at bedtime, Trulicity 1.5 MG weekly (increased recent visit).  Continues on Gabapentin for neuropathy pain to both feet, R>L. Metformin in the past caused GI issues.   ? ?She is taking Vitamin D supplement weekly at home.  B12 level recent visit was on lower side of normal, is not taking supplement. ?Hypoglycemic episodes:no ?Polydipsia/polyuria: no ?Visual disturbance: yes ?Chest pain: no ?Paresthesias: no ?Glucose Monitoring: yes ?            Accucheck frequency: Daily ?            Fasting glucose: 113 to 200 -- 113 this morning ?            Post prandial: ?            Evening: ?            Before meals: ?Taking Insulin?: yes ?            Long acting insulin: Lantus 30 units ?            Short acting insulin: ?Blood Pressure Monitoring: not checking ?Retinal Examination: Up to Date -- coming up, diabetic retinopathy ?Foot Exam: Up to Date ?Pneumovax:  refuses ?Influenza:  refuses ?Aspirin: no  ? ?Relevant past medical, surgical, family and social history reviewed and updated as indicated. Interim medical history since our last visit reviewed. ?Allergies and medications reviewed and updated. ? ?Review of Systems  ?Constitutional:  Negative for activity change, appetite change, diaphoresis, fatigue and fever.  ?Respiratory:  Negative for cough, chest tightness, shortness of breath and wheezing.   ?Cardiovascular:  Negative for chest pain, palpitations and leg swelling.  ?Gastrointestinal: Negative.    ?Endocrine: Negative for cold intolerance, heat intolerance, polydipsia, polyphagia and polyuria.  ?Neurological: Negative.   ?Psychiatric/Behavioral: Negative.    ? ?Per HPI unless specifically indicated above ? ?   ?Objective:  ?  ?BP 116/76   Pulse 76   Temp (!) 97.5 ?F (36.4 ?C) (Oral)   Wt 224 lb 9.6 oz (101.9 kg)   SpO2 98%   BMI 41.08 kg/m?   ?Wt Readings from Last 3 Encounters:  ?06/10/21 224 lb 9.6 oz (101.9 kg)  ?05/05/21 225 lb 6.4 oz (102.2 kg)  ?11/24/20 227 lb (103 kg)  ?  ?Physical Exam ?Vitals and nursing note reviewed.  ?Constitutional:   ?   General: She is awake. She is not in acute distress. ?   Appearance: She is well-developed and well-groomed. She is obese. She is not ill-appearing or toxic-appearing.  ?HENT:  ?   Head: Normocephalic.  ?   Right Ear: Hearing normal.  ?   Left Ear: Hearing normal.  ?Eyes:  ?   General: Lids are normal.     ?   Right eye: No discharge.     ?  Left eye: No discharge.  ?   Extraocular Movements: Extraocular movements intact.  ?   Conjunctiva/sclera: Conjunctivae normal.  ?   Pupils: Pupils are equal, round, and reactive to light.  ?   Funduscopic exam: ?   Right eye: No hemorrhage.     ?   Left eye: No hemorrhage.  ?   Visual Fields: Right eye visual fields normal and left eye visual fields normal.  ?Neck:  ?   Thyroid: No thyromegaly.  ?   Vascular: No carotid bruit.  ?Cardiovascular:  ?   Rate and Rhythm: Normal rate and regular rhythm.  ?   Heart sounds: Normal heart sounds. No murmur heard. ?  No gallop.  ?Pulmonary:  ?   Effort: Pulmonary effort is normal. No accessory muscle usage or respiratory distress.  ?   Breath sounds: Normal breath sounds.  ?Abdominal:  ?   General: Bowel sounds are normal.  ?   Palpations: Abdomen is soft.  ?Musculoskeletal:  ?   Cervical back: Normal range of motion and neck supple.  ?   Right lower leg: No edema.  ?   Left lower leg: No edema.  ?Lymphadenopathy:  ?   Head:  ?   Right side of head: No submental,  submandibular, tonsillar, preauricular or posterior auricular adenopathy.  ?   Left side of head: No submental, submandibular, tonsillar, preauricular or posterior auricular adenopathy.  ?   Cervical: No cervical adenopathy.  ?Skin: ?   General: Skin is warm and dry.  ?Neurological:  ?   Mental Status: She is alert and oriented to person, place, and time.  ?Psychiatric:     ?   Attention and Perception: Attention normal.     ?   Mood and Affect: Mood normal.     ?   Speech: Speech normal.     ?   Behavior: Behavior normal. Behavior is cooperative.     ?   Thought Content: Thought content normal.  ? ? ?Results for orders placed or performed in visit on 05/05/21  ?Bayer DCA Hb A1c Waived  ?Result Value Ref Range  ? HB A1C (BAYER DCA - WAIVED) 9.1 (H) 4.8 - 5.6 %  ?Microalbumin, Urine Waived  ?Result Value Ref Range  ? Microalb, Ur Waived 150 (H) 0 - 19 mg/L  ? Creatinine, Urine Waived 200 10 - 300 mg/dL  ? Microalb/Creat Ratio 30-300 (H) <30 mg/g  ?CBC with Differential/Platelet  ?Result Value Ref Range  ? WBC 9.8 3.4 - 10.8 x10E3/uL  ? RBC 4.68 3.77 - 5.28 x10E6/uL  ? Hemoglobin 13.9 11.1 - 15.9 g/dL  ? Hematocrit 41.8 34.0 - 46.6 %  ? MCV 89 79 - 97 fL  ? MCH 29.7 26.6 - 33.0 pg  ? MCHC 33.3 31.5 - 35.7 g/dL  ? RDW 12.1 11.7 - 15.4 %  ? Platelets 331 150 - 450 x10E3/uL  ? Neutrophils 68 Not Estab. %  ? Lymphs 25 Not Estab. %  ? Monocytes 5 Not Estab. %  ? Eos 1 Not Estab. %  ? Basos 1 Not Estab. %  ? Neutrophils Absolute 6.6 1.4 - 7.0 x10E3/uL  ? Lymphocytes Absolute 2.4 0.7 - 3.1 x10E3/uL  ? Monocytes Absolute 0.5 0.1 - 0.9 x10E3/uL  ? EOS (ABSOLUTE) 0.1 0.0 - 0.4 x10E3/uL  ? Basophils Absolute 0.1 0.0 - 0.2 x10E3/uL  ? Immature Granulocytes 0 Not Estab. %  ? Immature Grans (Abs) 0.0 0.0 - 0.1 x10E3/uL  ?Comprehensive metabolic panel  ?  Result Value Ref Range  ? Glucose 234 (H) 70 - 99 mg/dL  ? BUN 17 6 - 24 mg/dL  ? Creatinine, Ser 0.67 0.57 - 1.00 mg/dL  ? eGFR 110 >59 mL/min/1.73  ? BUN/Creatinine Ratio 25 (H) 9  - 23  ? Sodium 139 134 - 144 mmol/L  ? Potassium 4.7 3.5 - 5.2 mmol/L  ? Chloride 99 96 - 106 mmol/L  ? CO2 23 20 - 29 mmol/L  ? Calcium 9.5 8.7 - 10.2 mg/dL  ? Total Protein 7.2 6.0 - 8.5 g/dL  ? Albumin 4.4 3.8 - 4.8 g/dL  ? Globulin, Total 2.8 1.5 - 4.5 g/dL  ? Albumin/Globulin Ratio 1.6 1.2 - 2.2  ? Bilirubin Total 0.3 0.0 - 1.2 mg/dL  ? Alkaline Phosphatase 82 44 - 121 IU/L  ? AST 11 0 - 40 IU/L  ? ALT 12 0 - 32 IU/L  ?Lipid Panel w/o Chol/HDL Ratio  ?Result Value Ref Range  ? Cholesterol, Total 155 100 - 199 mg/dL  ? Triglycerides 245 (H) 0 - 149 mg/dL  ? HDL 40 >39 mg/dL  ? VLDL Cholesterol Cal 40 5 - 40 mg/dL  ? LDL Chol Calc (NIH) 75 0 - 99 mg/dL  ?TSH  ?Result Value Ref Range  ? TSH 2.570 0.450 - 4.500 uIU/mL  ?VITAMIN D 25 Hydroxy (Vit-D Deficiency, Fractures)  ?Result Value Ref Range  ? Vit D, 25-Hydroxy 23.7 (L) 30.0 - 100.0 ng/mL  ?Vitamin B12  ?Result Value Ref Range  ? Vitamin B-12 354 232 - 1,245 pg/mL  ?HIV Antibody (routine testing w rflx)  ?Result Value Ref Range  ? HIV Screen 4th Generation wRfx Non Reactive Non Reactive  ?Magnesium  ?Result Value Ref Range  ? Magnesium 1.7 1.6 - 2.3 mg/dL  ? ?   ?Assessment & Plan:  ? ?Problem List Items Addressed This Visit   ? ?  ? Endocrine  ? Type 2 diabetes mellitus with diabetic polyneuropathy, with long-term current use of insulin ([place]) - Primary  ?  Chronic, ongoing.  A1c 9.1% last visit and urine ALB 150.  Continue Gabapentin as ordered at this time.  Recommend she pick up Jardiance today and ensure she is taking 1.5 MG Trulicity.  At this time continue Glipizide 10 MG BID and reduce in future if able + continue Lantus 30 units, but discussed with her that with medication changes if lows present she is to reduce this by 3 units every 3 days if needed = if BS in morning <130 consistently.  Check BS TID and document for visits.  Return in 2 months. ?  ?  ? Relevant Medications  ? Dulaglutide (TRULICITY) 1.5 DX/4.1OI SOPN  ? empagliflozin (JARDIANCE) 25  MG TABS tablet  ?  ? Other  ? B12 deficiency  ?  Noted recent labs, will send in supplement for her to take daily as may be beneficial to neuropathy discomfort. ?  ?  ? Morbid obesity ([place])  ?  BMI 41.08 with T2DM, HLD, HTN.  Re

## 2021-06-10 NOTE — Assessment & Plan Note (Addendum)
Chronic, ongoing.  A1c 9.1% last visit and urine ALB 150.  Continue Gabapentin as ordered at this time.  Recommend she pick up Jardiance today and ensure she is taking 1.5 MG Trulicity.  At this time continue Glipizide 10 MG BID and reduce in future if able + continue Lantus 30 units, but discussed with her that with medication changes if lows present she is to reduce this by 3 units every 3 days if needed = if BS in morning <130 consistently.  Check BS TID and document for visits.  Return in 2 months. ?

## 2021-06-10 NOTE — Assessment & Plan Note (Signed)
Noted recent labs, will send in supplement for her to take daily as may be beneficial to neuropathy discomfort. ?

## 2021-06-10 NOTE — Assessment & Plan Note (Signed)
BMI 41.08 with T2DM, HLD, HTN.  Recommended eating smaller high protein, low fat meals more frequently and exercising 30 mins a day 5 times a week with a goal of 10-15lb weight loss in the next 3 months. Patient voiced their understanding and motivation to adhere to these recommendations. ? ?

## 2021-06-17 ENCOUNTER — Ambulatory Visit (INDEPENDENT_AMBULATORY_CARE_PROVIDER_SITE_OTHER): Payer: Medicaid Other | Admitting: Advanced Practice Midwife

## 2021-06-17 VITALS — BP 120/62 | Wt 221.0 lb

## 2021-06-17 DIAGNOSIS — T839XXD Unspecified complication of genitourinary prosthetic device, implant and graft, subsequent encounter: Secondary | ICD-10-CM

## 2021-06-17 DIAGNOSIS — Z30433 Encounter for removal and reinsertion of intrauterine contraceptive device: Secondary | ICD-10-CM | POA: Diagnosis not present

## 2021-06-24 ENCOUNTER — Encounter: Payer: Self-pay | Admitting: Advanced Practice Midwife

## 2021-06-24 NOTE — Progress Notes (Addendum)
? ? ?GYNECOLOGY OFFICE PROCEDURE NOTE ? ?Date of Service: 06/17/2021 ? ?Michelle Warner is a 46 y.o. here accompanied by her husband for IUD removal and reinsertion because her IUD "dropped". The patient currently has a Mirena IUD placed on 06/03/2015 for control of heavy menstrual bleeding due to fibroids. She had some left flank pain about a year ago and was seen for pelvic ultrasound in July of last year. The IUD was found to be in the lower uterine segment. She is now in the office for removal and reinsertion. We discussed the possibility that the new IUD will also end up misplaced given her history. We will follow up today's IUD insertion with a gyn ultrasound I about 2 weeks.  Last pap smear was on 08/25/19 and was normal. ? ?Review of Systems  ?Constitutional:  Negative for chills and fever.  ?HENT:  Negative for congestion, ear discharge, ear pain, hearing loss, sinus pain and sore throat.   ?Eyes:  Negative for blurred vision and double vision.  ?Respiratory:  Negative for cough, shortness of breath and wheezing.   ?Cardiovascular:  Negative for chest pain, palpitations and leg swelling.  ?Gastrointestinal:  Negative for abdominal pain, blood in stool, constipation, diarrhea, heartburn, melena, nausea and vomiting.  ?Genitourinary:  Negative for dysuria, flank pain, frequency, hematuria and urgency.  ?Musculoskeletal:  Positive for back pain. Negative for joint pain and myalgias.  ?Skin:  Negative for itching and rash.  ?Neurological:  Negative for dizziness, tingling, tremors, sensory change, speech change, focal weakness, seizures, loss of consciousness, weakness and headaches.  ?Endo/Heme/Allergies:  Negative for environmental allergies. Does not bruise/bleed easily.  ?Psychiatric/Behavioral:  Negative for depression, hallucinations, memory loss, substance abuse and suicidal ideas. The patient is not nervous/anxious and does not have insomnia.   ? ?Vital Signs: BP 120/62   Wt 221 lb (100.2 kg)   BMI 40.42  kg/m?  ?Constitutional: Well nourished, well developed female in no acute distress.  ?HEENT: normal ?Skin: Warm and dry.  ?Respiratory:  Normal respiratory effort ?Back: no CVAT ?Neuro: DTRs 2+, Cranial nerves grossly intact ?Psych: Alert and Oriented x3. Some memory deficits. Normal mood and affect.  ?MS: normal gait, normal bilateral lower extremity ROM/strength/stability. ? ?Pelvic exam: (female chaperone present) ?is not limited by body habitus ?EGBUS: within normal limits ?Vagina: within normal limits and with normal mucosa  ?Cervix: normal appearance ? ? ?IUD Removal and Reinsertion  ?Patient identified, informed consent performed, consent signed.   Discussed risks of irregular bleeding, cramping, infection, malpositioning or uterine perforation of the IUD which may require further procedures. Time out was performed. Speculum placed in the vagina. The strings of the IUD were grasped and pulled using ring forceps. The IUD was successfully removed in its entirety. The cervix was cleaned with Betadine x 2 and grasped anteriorly with a single tooth tenaculum.  The uterus was sounded to  5 cm using a uterine sound.  The IUD was then placed per manufacturer's recommendations. Strings trimmed to 3 cm. Tenaculum was removed, good hemostasis noted. Patient tolerated procedure well.  ? ? ?Patient is s/p Mirena IUD removal and reinsertion ?Patient was given post-procedure instructions.  Patient was also asked to check IUD strings periodically and follow up in 2 weeks for gyn ultrasound for check of placement. ? ? ?Christean Leaf, CNM ?[facility] ?[facility] ?06/24/21, 3:02 PM ? ? ? ?IUD insertion CPT 58300,  ?Skyla [id number] ?Mirena [phone] ?Liletta [id number] ?Paraguard [id number] ?Verdia Kuba [id number] ?IUD remval [id number] ?  Modifer 25, plus Modifer 79 is done during a global billing visit   ?

## 2021-06-28 ENCOUNTER — Ambulatory Visit: Payer: Medicaid Other | Admitting: Family Medicine

## 2021-07-07 ENCOUNTER — Ambulatory Visit (INDEPENDENT_AMBULATORY_CARE_PROVIDER_SITE_OTHER): Payer: Medicaid Other

## 2021-07-07 DIAGNOSIS — T839XXD Unspecified complication of genitourinary prosthetic device, implant and graft, subsequent encounter: Secondary | ICD-10-CM | POA: Diagnosis not present

## 2021-07-11 ENCOUNTER — Ambulatory Visit: Payer: Medicaid Other

## 2021-07-29 LAB — HM DIABETES EYE EXAM

## 2021-08-07 DIAGNOSIS — Z6740 Type O blood, Rh positive: Secondary | ICD-10-CM | POA: Insufficient documentation

## 2021-08-07 NOTE — Patient Instructions (Addendum)
A1c today is 7.6, almost at goal of <7%.  We are going up on Trulicity to 3 MG weekly, if sugars are consistently less then 130 in the morning fasting then decrease insulin by 3 units every 3 days.  Diabetes Mellitus and Nutrition, Adult When you have diabetes, or diabetes mellitus, it is very important to have healthy eating habits because your blood sugar (glucose) levels are greatly affected by what you eat and drink. Eating healthy foods in the right amounts, at about the same times every day, can help you: Manage your blood glucose. Lower your risk of heart disease. Improve your blood pressure. Reach or maintain a healthy weight. What can affect my meal plan? Every person with diabetes is different, and each person has different needs for a meal plan. Your health care provider may recommend that you work with a dietitian to make a meal plan that is best for you. Your meal plan may vary depending on factors such as: The calories you need. The medicines you take. Your weight. Your blood glucose, blood pressure, and cholesterol levels. Your activity level. Other health conditions you have, such as heart or kidney disease. How do carbohydrates affect me? Carbohydrates, also called carbs, affect your blood glucose level more than any other type of food. Eating carbs raises the amount of glucose in your blood. It is important to know how many carbs you can safely have in each meal. This is different for every person. Your dietitian can help you calculate how many carbs you should have at each meal and for each snack. How does alcohol affect me? Alcohol can cause a decrease in blood glucose (hypoglycemia), especially if you use insulin or take certain diabetes medicines by mouth. Hypoglycemia can be a life-threatening condition. Symptoms of hypoglycemia, such as sleepiness, dizziness, and confusion, are similar to symptoms of having too much alcohol. Do not drink alcohol if: Your health care  provider tells you not to drink. You are pregnant, may be pregnant, or are planning to become pregnant. If you drink alcohol: Limit how much you have to: 0-1 drink a day for women. 0-2 drinks a day for men. Know how much alcohol is in your drink. In the [place] one drink equals one 12 oz bottle of beer (355 mL), one 5 oz glass of wine (148 mL), or one 1 oz glass of hard liquor (44 mL). Keep yourself hydrated with water, diet soda, or unsweetened iced tea. Keep in mind that regular soda, juice, and other mixers may contain a lot of sugar and must be counted as carbs. What are tips for following this plan?  Reading food labels Start by checking the serving size on the Nutrition Facts label of packaged foods and drinks. The number of calories and the amount of carbs, fats, and other nutrients listed on the label are based on one serving of the item. Many items contain more than one serving per package. Check the total grams (g) of carbs in one serving. Check the number of grams of saturated fats and trans fats in one serving. Choose foods that have a low amount or none of these fats. Check the number of milligrams (mg) of salt (sodium) in one serving. Most people should limit total sodium intake to less than 2,300 mg per day. Always check the nutrition information of foods labeled as "low-fat" or "nonfat." These foods may be higher in added sugar or refined carbs and should be avoided. Talk to your dietitian to identify your  daily goals for nutrients listed on the label. Shopping Avoid buying canned, pre-made, or processed foods. These foods tend to be high in fat, sodium, and added sugar. Shop around the outside edge of the grocery store. This is where you will most often find fresh fruits and vegetables, bulk grains, fresh meats, and fresh dairy products. Cooking Use low-heat cooking methods, such as baking, instead of high-heat cooking methods, such as deep frying. Cook using healthy oils, such  as olive, canola, or sunflower oil. Avoid cooking with butter, cream, or high-fat meats. Meal planning Eat meals and snacks regularly, preferably at the same times every day. Avoid going long periods of time without eating. Eat foods that are high in fiber, such as fresh fruits, vegetables, beans, and whole grains. Eat 4-6 oz (112-168 g) of lean protein each day, such as lean meat, chicken, fish, eggs, or tofu. One ounce (oz) (28 g) of lean protein is equal to: 1 oz (28 g) of meat, chicken, or fish. 1 egg.  cup (62 g) of tofu. Eat some foods each day that contain healthy fats, such as avocado, nuts, seeds, and fish. What foods should I eat? Fruits Berries. Apples. Oranges. Peaches. Apricots. Plums. Grapes. Mangoes. Papayas. Pomegranates. Kiwi. Cherries. Vegetables Leafy greens, including lettuce, spinach, kale, chard, collard greens, mustard greens, and cabbage. Beets. Cauliflower. Broccoli. Carrots. Green beans. Tomatoes. Peppers. Onions. Cucumbers. Brussels sprouts. Grains Whole grains, such as whole-wheat or whole-grain bread, crackers, tortillas, cereal, and pasta. Unsweetened oatmeal. Quinoa. Brown or wild rice. Meats and other proteins Seafood. Poultry without skin. Lean cuts of poultry and beef. Tofu. Nuts. Seeds. Dairy Low-fat or fat-free dairy products such as milk, yogurt, and cheese. The items listed above may not be a complete list of foods and beverages you can eat and drink. Contact a dietitian for more information. What foods should I avoid? Fruits Fruits canned with syrup. Vegetables Canned vegetables. Frozen vegetables with butter or cream sauce. Grains Refined white flour and flour products such as bread, pasta, snack foods, and cereals. Avoid all processed foods. Meats and other proteins Fatty cuts of meat. Poultry with skin. Breaded or fried meats. Processed meat. Avoid saturated fats. Dairy Full-fat yogurt, cheese, or milk. Beverages Sweetened drinks, such as  soda or iced tea. The items listed above may not be a complete list of foods and beverages you should avoid. Contact a dietitian for more information. Questions to ask a health care provider Do I need to meet with a certified diabetes care and education specialist? Do I need to meet with a dietitian? What number can I call if I have questions? When are the best times to check my blood glucose? Where to find more information: American Diabetes Association: diabetes.org Academy of Nutrition and Dietetics: eatright.[employer] of Diabetes and Digestive and Kidney Diseases: [employer] Association of Diabetes Care & Education Specialists: diabeteseducator.org Summary It is important to have healthy eating habits because your blood sugar (glucose) levels are greatly affected by what you eat and drink. It is important to use alcohol carefully. A healthy meal plan will help you manage your blood glucose and lower your risk of heart disease. Your health care provider may recommend that you work with a dietitian to make a meal plan that is best for you. This information is not intended to replace advice given to you by your health care provider. Make sure you discuss any questions you have with your health care provider. Document Revised: 10/08/2019 Document Reviewed: 10/08/2019 Elsevier Patient  Education  Michelle Warner.

## 2021-08-12 ENCOUNTER — Ambulatory Visit (INDEPENDENT_AMBULATORY_CARE_PROVIDER_SITE_OTHER): Payer: Medicaid Other | Admitting: Nurse Practitioner

## 2021-08-12 ENCOUNTER — Telehealth: Payer: Self-pay | Admitting: Nurse Practitioner

## 2021-08-12 ENCOUNTER — Encounter: Payer: Self-pay | Admitting: Nurse Practitioner

## 2021-08-12 VITALS — BP 105/71 | HR 71 | Temp 98.1°F | Ht 62.0 in | Wt 212.2 lb

## 2021-08-12 DIAGNOSIS — G63 Polyneuropathy in diseases classified elsewhere: Secondary | ICD-10-CM

## 2021-08-12 DIAGNOSIS — E1142 Type 2 diabetes mellitus with diabetic polyneuropathy: Secondary | ICD-10-CM

## 2021-08-12 DIAGNOSIS — K219 Gastro-esophageal reflux disease without esophagitis: Secondary | ICD-10-CM

## 2021-08-12 DIAGNOSIS — E1159 Type 2 diabetes mellitus with other circulatory complications: Secondary | ICD-10-CM | POA: Diagnosis not present

## 2021-08-12 DIAGNOSIS — E785 Hyperlipidemia, unspecified: Secondary | ICD-10-CM

## 2021-08-12 DIAGNOSIS — E113293 Type 2 diabetes mellitus with mild nonproliferative diabetic retinopathy without macular edema, bilateral: Secondary | ICD-10-CM | POA: Diagnosis not present

## 2021-08-12 DIAGNOSIS — E1129 Type 2 diabetes mellitus with other diabetic kidney complication: Secondary | ICD-10-CM | POA: Diagnosis not present

## 2021-08-12 DIAGNOSIS — E538 Deficiency of other specified B group vitamins: Secondary | ICD-10-CM

## 2021-08-12 DIAGNOSIS — R809 Proteinuria, unspecified: Secondary | ICD-10-CM

## 2021-08-12 DIAGNOSIS — E1169 Type 2 diabetes mellitus with other specified complication: Secondary | ICD-10-CM

## 2021-08-12 DIAGNOSIS — I152 Hypertension secondary to endocrine disorders: Secondary | ICD-10-CM

## 2021-08-12 DIAGNOSIS — E559 Vitamin D deficiency, unspecified: Secondary | ICD-10-CM

## 2021-08-12 DIAGNOSIS — G4733 Obstructive sleep apnea (adult) (pediatric): Secondary | ICD-10-CM

## 2021-08-12 DIAGNOSIS — F419 Anxiety disorder, unspecified: Secondary | ICD-10-CM

## 2021-08-12 DIAGNOSIS — Z794 Long term (current) use of insulin: Secondary | ICD-10-CM

## 2021-08-12 LAB — BAYER DCA HB A1C WAIVED: HB A1C (BAYER DCA - WAIVED): 7.6 % — ABNORMAL HIGH (ref 4.8–5.6)

## 2021-08-12 MED ORDER — OMEPRAZOLE 20 MG PO CPDR: 20.0000 mg | DELAYED_RELEASE_CAPSULE | Freq: Two times a day (BID) | ORAL | 4 refills | Status: DC

## 2021-08-12 MED ORDER — FREESTYLE LIBRE READER DEVI
2 refills | Status: DC
Start: 2021-08-12 — End: 2021-08-12

## 2021-08-12 MED ORDER — GABAPENTIN 300 MG PO CAPS: 300.0000 mg | ORAL_CAPSULE | Freq: Three times a day (TID) | ORAL | 4 refills | Status: DC

## 2021-08-12 MED ORDER — DEXCOM G5 MOB/G4 PLAT SENSOR MISC: 12 refills | Status: DC

## 2021-08-12 MED ORDER — LISINOPRIL 20 MG PO TABS: 20.0000 mg | ORAL_TABLET | Freq: Every day | ORAL | 4 refills | Status: DC

## 2021-08-12 MED ORDER — TRAZODONE HCL 100 MG PO TABS: ORAL_TABLET | ORAL | 4 refills | Status: DC

## 2021-08-12 MED ORDER — VITAMIN D (ERGOCALCIFEROL) 1.25 MG (50000 UNIT) PO CAPS: 50000.0000 [IU] | ORAL_CAPSULE | ORAL | 5 refills | Status: DC

## 2021-08-12 MED ORDER — FREESTYLE LIBRE 3 SENSOR MISC: 12 refills | Status: DC

## 2021-08-12 MED ORDER — ROSUVASTATIN CALCIUM 10 MG PO TABS: 10.0000 mg | ORAL_TABLET | Freq: Every day | ORAL | 4 refills | Status: DC

## 2021-08-12 MED ORDER — DEXCOM G5 MOBILE RECEIVER DEVI: 2 refills | Status: DC

## 2021-08-12 MED ORDER — LANTUS SOLOSTAR 100 UNIT/ML ~~LOC~~ SOPN: 30.0000 [IU] | PEN_INJECTOR | Freq: Every day | SUBCUTANEOUS | 4 refills | Status: DC

## 2021-08-12 MED ORDER — FLOVENT HFA 110 MCG/ACT IN AERO
INHALATION_SPRAY | RESPIRATORY_TRACT | 5 refills | Status: DC
Start: 2021-08-12 — End: 2022-06-20

## 2021-08-12 MED ORDER — GLIPIZIDE 10 MG PO TABS: ORAL_TABLET | ORAL | 4 refills | Status: DC

## 2021-08-12 MED ORDER — CITALOPRAM HYDROBROMIDE 20 MG PO TABS: ORAL_TABLET | ORAL | 4 refills | Status: DC

## 2021-08-12 MED ORDER — VITAMIN B-12 1000 MCG PO TABS: 1000.0000 ug | ORAL_TABLET | Freq: Every day | ORAL | 4 refills | Status: DC

## 2021-08-12 MED ORDER — TRULICITY 3 MG/0.5ML ~~LOC~~ SOAJ: 3.0000 mg | SUBCUTANEOUS | 4 refills | Status: DC

## 2021-08-12 NOTE — Assessment & Plan Note (Signed)
Chronic, ongoing with improved A1c today 7.6%, previous 9.1%.  Urine ALB 150 in February 2023.  Continue Lisinopril for kidney protection.  Adjust medication regimen - increase Trulicity to 3 MG weekly and continue Jardiance daily with goal in future to reduce to discontinue insulin, discussed with patient.  At this time continue Glipizide 10 MG BID and recommend she reduce Lantus 30 units, discussed with her that with medication changes if lows present she is to reduce this by 3 units every 3 days if needed = if BS in morning <130 consistently.  Check BS TID and document for visits.  Return in 3 months.

## 2021-08-12 NOTE — Assessment & Plan Note (Signed)
Chronic, stable with medication.  She needs to scheduled with GI her repeat EGD.  At this time continue PPI and trial reduction in future, if poor tolerance continue current treatment.  Mag level up to date

## 2021-08-12 NOTE — Assessment & Plan Note (Signed)
Chronic, ongoing with improved A1c today 7.6%, previous 9.1%.  Adjust medication regimen - increase Trulicity to 3 MG weekly and continue Jardiance daily with goal in future to reduce to discontinue insulin, discussed with patient.  At this time continue Glipizide 10 MG BID and recommend she reduce Lantus 30 units, discussed with her that with medication changes if lows present she is to reduce this by 3 units every 3 days if needed = if BS in morning <130 consistently.  Check BS TID and document for visits.  Return in 3 months.

## 2021-08-12 NOTE — Assessment & Plan Note (Addendum)
Chronic, ongoing with improved A1c today 7.6%, previous 9.1%.  Continue Gabapentin and B12 for neuropathy.  Adjust medication regimen - increase Trulicity to 3 MG weekly and continue Jardiance daily with goal in future to reduce to discontinue insulin, discussed with patient.  At this time continue Glipizide 10 MG BID and recommend she reduce Lantus 30 units, discussed with her that with medication changes if lows present she is to reduce this by 3 units every 3 days if needed = if BS in morning <130 consistently.  Check BS TID and document for visits.  Return in 3 months.

## 2021-08-12 NOTE — Assessment & Plan Note (Signed)
Chronic, ongoing.  Is followed by [facility] at this time, continue this collaboration -- will send in refills for her while she is waiting to schedule with them.  She is aware to return to them as soon as possible.  Denies SI/HI.

## 2021-08-12 NOTE — Progress Notes (Signed)
BP 105/71   Pulse 71   Temp 98.1 F (36.7 C) (Oral)   Ht '5\' 2"'$  (1.575 m)   Wt 212 lb 3.2 oz (96.3 kg)   SpO2 99%   BMI 38.81 kg/m    Subjective:    Patient ID: Michelle Warner, female    DOB: [birth year], 46 y.o.   MRN: [id number]  HPI: Michelle Warner is a 46 y.o. female  Chief Complaint  Patient presents with   Diabetes    Patient recent Diabetic Eye Exam was requested at today's visit.    Hyperlipidemia   Hypertension   Gastroesophageal Reflux   Anemia   DIABETES Last A1c was 9.1% in February -- taking Trulicity 1.5 MG weekly and Jardiance 25 MG daily + Glipizide 10 MG BID and Lantus 30 units at night -- goal is to reduce insulin and discontinue if possible in long run. Continues on Gabapentin for neuropathy pain + Vitamin B12 due to past low levels.  She also continues Vitamin D supplement weekly for history of low levels. Has lost 12 pounds since March.  In past took Metformin but this caused major GI issues. Hypoglycemic episodes:no Polydipsia/polyuria: no Visual disturbance: no Chest pain: no Paresthesias: no Glucose Monitoring: yes  Accucheck frequency: Daily  Fasting glucose: 107 to 199  Post prandial:  Evening:  Before meals: Taking Insulin?: yes  Long acting insulin: 30 units Lantus  Short acting insulin: Blood Pressure Monitoring: not checking Retinal Examination: Up to Date -- [facility]  Foot Exam: Up to Date Pneumovax: refuses Influenza: Up to Date Aspirin: no   HYPERTENSION / HYPERLIPIDEMIA Taking Lisinopril 20 MG daily and Crestor 10 MG daily. Satisfied with current treatment? yes Duration of hypertension: chronic BP monitoring frequency: rarely BP range:  BP medication side effects: no Duration of hyperlipidemia: chronic Cholesterol medication side effects: no Cholesterol supplements: none Medication compliance: good compliance Aspirin: no Recent stressors: no Recurrent headaches: no Visual changes: no Palpitations: no Dyspnea:  no Chest pain: no Lower extremity edema: no Dizzy/lightheaded: no   GERD Taking Omeprazole daily. GERD control status: stable Satisfied with current treatment? yes Heartburn frequency: occasional Medication side effects: no  Medication compliance: stable Dysphagia: no Odynophagia:  no Hematemesis: no Blood in stool: no EGD: no   DEPRESSION Continues on Celexa and Trazodone.  Sees psychiatry, last saw several months ago with [facility] -- needs refills as has been out of medications. Mood status: stable Satisfied with current treatment?: yes Symptom severity: moderate  Duration of current treatment : chronic Side effects: no Medication compliance: good compliance Psychotherapy/counseling: none Depressed mood: no Anxious mood:  occasional Anhedonia: no Significant weight loss or gain: no Insomnia: yes hard to fall asleep Fatigue: yes Feelings of worthlessness or guilt: no Impaired concentration/indecisiveness: no Suicidal ideations: no Hopelessness: no Crying spells: no    08/12/2021    9:09 AM 05/05/2021    9:55 AM 01/15/2018   12:50 PM 07/18/2017   11:29 AM  Depression screen PHQ 2/9  Decreased Interest 0 0 0 1  Down, Depressed, Hopeless 0 0 2 2  PHQ - 2 Score 0 0 2 3  Altered sleeping '3 1 1 1  '$ Tired, decreased energy '3 1 1 1  '$ Change in appetite 0 0 1 1  Feeling bad or failure about yourself  0 0 0 3  Trouble concentrating 0 0 1 1  Moving slowly or fidgety/restless 0 0 3 2  Suicidal thoughts 0 0 0 0  PHQ-9 Score 6 2  9 12  Difficult doing work/chores   Somewhat difficult Somewhat difficult       08/12/2021    9:10 AM 05/05/2021    9:55 AM 01/15/2018   12:49 PM 07/18/2017   11:35 AM  GAD 7 : Generalized Anxiety Score  Nervous, Anxious, on Edge '1 1 3 3  '$ Control/stop worrying 0 0 3 3  Worry too much - different things 0 '1 3 3  '$ Trouble relaxing 2 0 1 2  Restless 0 0 1 1  Easily annoyed or irritable 2 0 0 3  Afraid - awful might happen 0 0 1 1  Total GAD 7 Score '5 2  12 16  '$ Anxiety Difficulty Not difficult at all Not difficult at all Somewhat difficult Very difficult   Relevant past medical, surgical, family and social history reviewed and updated as indicated. Interim medical history since our last visit reviewed. Allergies and medications reviewed and updated.  Review of Systems  Constitutional:  Negative for activity change, appetite change, diaphoresis, fatigue and fever.  Respiratory:  Negative for cough, chest tightness, shortness of breath and wheezing.   Cardiovascular:  Negative for chest pain, palpitations and leg swelling.  Gastrointestinal: Negative.   Endocrine: Negative for cold intolerance, heat intolerance, polydipsia, polyphagia and polyuria.  Neurological: Negative.   Psychiatric/Behavioral: Negative.     Per HPI unless specifically indicated above     Objective:    BP 105/71   Pulse 71   Temp 98.1 F (36.7 C) (Oral)   Ht '5\' 2"'$  (1.575 m)   Wt 212 lb 3.2 oz (96.3 kg)   SpO2 99%   BMI 38.81 kg/m   Wt Readings from Last 3 Encounters:  08/12/21 212 lb 3.2 oz (96.3 kg)  06/17/21 221 lb (100.2 kg)  06/10/21 224 lb 9.6 oz (101.9 kg)    Physical Exam Vitals and nursing note reviewed.  Constitutional:      General: She is awake. She is not in acute distress.    Appearance: She is well-developed and well-groomed. She is obese. She is not ill-appearing or toxic-appearing.  HENT:     Head: Normocephalic.     Right Ear: Hearing normal.     Left Ear: Hearing normal.  Eyes:     General: Lids are normal.        Right eye: No discharge.        Left eye: No discharge.     Conjunctiva/sclera:     Right eye: Right conjunctiva is injected.     Pupils: Pupils are equal, round, and reactive to light.     Funduscopic exam:    Right eye: No hemorrhage.        Left eye: No hemorrhage.     Comments: Recent injections in right eye.  Neck:     Thyroid: No thyromegaly.     Vascular: No carotid bruit.  Cardiovascular:     Rate and  Rhythm: Normal rate and regular rhythm.     Heart sounds: Normal heart sounds. No murmur heard.   No gallop.  Pulmonary:     Effort: Pulmonary effort is normal. No accessory muscle usage or respiratory distress.     Breath sounds: Normal breath sounds.  Abdominal:     General: Bowel sounds are normal.     Palpations: Abdomen is soft.  Musculoskeletal:     Cervical back: Normal range of motion and neck supple.     Right lower leg: No edema.     Left lower leg:  No edema.  Lymphadenopathy:     Head:     Right side of head: No submental, submandibular, tonsillar, preauricular or posterior auricular adenopathy.     Left side of head: No submental, submandibular, tonsillar, preauricular or posterior auricular adenopathy.     Cervical: No cervical adenopathy.  Skin:    General: Skin is warm and dry.  Neurological:     Mental Status: She is alert and oriented to person, place, and time.  Psychiatric:        Attention and Perception: Attention normal.        Mood and Affect: Mood normal.        Speech: Speech normal.        Behavior: Behavior normal. Behavior is cooperative.        Thought Content: Thought content normal.    Results for orders placed or performed in visit on 08/12/21  Bayer DCA Hb A1c Waived  Result Value Ref Range   HB A1C (BAYER DCA - WAIVED) 7.6 (H) 4.8 - 5.6 %      Assessment & Plan:   Problem List Items Addressed This Visit       Cardiovascular and Mediastinum   Hypertension associated with diabetes ([place])    Chronic, ongoing with BP at goal in office today.  Currently on Lisinopril for kidney protection and BP, may benefit from switch to ARB in future due to her underlying asthma.  Recommend she monitor BP at least a few mornings a week at home and document.  DASH diet at home.  Labs today: BMP.  Return in 3 months.        Relevant Medications   rosuvastatin (CRESTOR) 10 MG tablet   lisinopril (ZESTRIL) 20 MG tablet   LANTUS SOLOSTAR 100 UNIT/ML  Solostar Pen   glipiZIDE (GLUCOTROL) 10 MG tablet   Dulaglutide (TRULICITY) 3 SA/6.3KZ SOPN   Other Relevant Orders   Bayer DCA Hb A1c Waived (Completed)   Basic metabolic panel     Digestive   Gastro-esophageal reflux disease without esophagitis    Chronic, stable with medication.  She needs to scheduled with GI her repeat EGD.  At this time continue PPI and trial reduction in future, if poor tolerance continue current treatment.  Mag level up to date       Relevant Medications   omeprazole (PRILOSEC) 20 MG capsule     Endocrine   Type 2 diabetes mellitus with diabetic polyneuropathy, with long-term current use of insulin (HCC)    Chronic, ongoing with improved A1c today 7.6%, previous 9.1%.  Continue Gabapentin and B12 for neuropathy.  Adjust medication regimen - increase Trulicity to 3 MG weekly and continue Jardiance daily with goal in future to reduce to discontinue insulin, discussed with patient.  At this time continue Glipizide 10 MG BID and recommend she reduce Lantus 30 units, discussed with her that with medication changes if lows present she is to reduce this by 3 units every 3 days if needed = if BS in morning <130 consistently.  Check BS TID and document for visits.  Return in 3 months.      Relevant Medications   traZODone (DESYREL) 100 MG tablet   citalopram (CELEXA) 20 MG tablet   rosuvastatin (CRESTOR) 10 MG tablet   lisinopril (ZESTRIL) 20 MG tablet   LANTUS SOLOSTAR 100 UNIT/ML Solostar Pen   glipiZIDE (GLUCOTROL) 10 MG tablet   gabapentin (NEURONTIN) 300 MG capsule   Dulaglutide (TRULICITY) 3 SW/1.0XN SOPN   Other Relevant  Orders   Bayer DCA Hb A1c Waived (Completed)   Basic metabolic panel   Hyperlipidemia associated with type 2 diabetes mellitus (HCC)    Chronic, ongoing.  Continue current medication regimen and adjust as needed.  Lipid panel today.      Relevant Medications   rosuvastatin (CRESTOR) 10 MG tablet   lisinopril (ZESTRIL) 20 MG tablet    LANTUS SOLOSTAR 100 UNIT/ML Solostar Pen   glipiZIDE (GLUCOTROL) 10 MG tablet   Dulaglutide (TRULICITY) 3 KG/8.1EH SOPN   Other Relevant Orders   Bayer DCA Hb A1c Waived (Completed)   Lipid Panel w/o Chol/HDL Ratio   Type 2 diabetes mellitus with both eyes affected by mild nonproliferative retinopathy without macular edema, with long-term current use of insulin (HCC)    Chronic, ongoing with improved A1c today 7.6%, previous 9.1%.  Adjust medication regimen - increase Trulicity to 3 MG weekly and continue Jardiance daily with goal in future to reduce to discontinue insulin, discussed with patient.  At this time continue Glipizide 10 MG BID and recommend she reduce Lantus 30 units, discussed with her that with medication changes if lows present she is to reduce this by 3 units every 3 days if needed = if BS in morning <130 consistently.  Check BS TID and document for visits.  Return in 3 months.       Relevant Medications   rosuvastatin (CRESTOR) 10 MG tablet   lisinopril (ZESTRIL) 20 MG tablet   LANTUS SOLOSTAR 100 UNIT/ML Solostar Pen   glipiZIDE (GLUCOTROL) 10 MG tablet   Dulaglutide (TRULICITY) 3 UD/1.4HF SOPN   Other Relevant Orders   Bayer DCA Hb A1c Waived (Completed)   Type 2 diabetes mellitus with proteinuria (HCC) - Primary    Chronic, ongoing with improved A1c today 7.6%, previous 9.1%.  Urine ALB 150 in February 2023.  Continue Lisinopril for kidney protection.  Adjust medication regimen - increase Trulicity to 3 MG weekly and continue Jardiance daily with goal in future to reduce to discontinue insulin, discussed with patient.  At this time continue Glipizide 10 MG BID and recommend she reduce Lantus 30 units, discussed with her that with medication changes if lows present she is to reduce this by 3 units every 3 days if needed = if BS in morning <130 consistently.  Check BS TID and document for visits.  Return in 3 months.      Relevant Medications   rosuvastatin (CRESTOR) 10 MG  tablet   lisinopril (ZESTRIL) 20 MG tablet   LANTUS SOLOSTAR 100 UNIT/ML Solostar Pen   glipiZIDE (GLUCOTROL) 10 MG tablet   Dulaglutide (TRULICITY) 3 W[place]/6.3ZC SOPN   Other Relevant Orders   Bayer DCA Hb A1c Waived (Completed)   Basic metabolic panel     Nervous and Auditory   Vitamin B12 deficiency neuropathy (HCC)    Ongoing.  Noted recent labs, will refill supplement for her to take daily as may be beneficial to neuropathy discomfort.       Relevant Medications   traZODone (DESYREL) 100 MG tablet   citalopram (CELEXA) 20 MG tablet   gabapentin (NEURONTIN) 300 MG capsule     Other   Anxiety    Chronic, ongoing.  Is followed by [facility] at this time, continue this collaboration -- will send in refills for her while she is waiting to schedule with them.  She is aware to return to them as soon as possible.  Denies SI/HI.       Relevant Medications   traZODone (  DESYREL) 100 MG tablet   citalopram (CELEXA) 20 MG tablet   Morbid obesity (HCC)    BMI 38.81, has lost 12 pounds, with T2DM, HLD, HTN.  Recommended eating smaller high protein, low fat meals more frequently and exercising 30 mins a day 5 times a week with a goal of 10-15lb weight loss in the next 3 months. Patient voiced their understanding and motivation to adhere to these recommendations.        Relevant Medications   LANTUS SOLOSTAR 100 UNIT/ML Solostar Pen   glipiZIDE (GLUCOTROL) 10 MG tablet   Dulaglutide (TRULICITY) 3 LJ/4.4BE SOPN   Vitamin D deficiency    Chronic, ongoing, taking weekly supplement.  Continue this supplement and adjust as needed. Vit D level today.       Relevant Orders   VITAMIN D 25 Hydroxy (Vit-D Deficiency, Fractures)     Follow up plan: Return in about 3 months (around 11/12/2021) for T2DM, HTN/HLD, MOOD, B12, GERD, ASTHMA.

## 2021-08-12 NOTE — Assessment & Plan Note (Signed)
Chronic, ongoing with BP at goal in office today.  Currently on Lisinopril for kidney protection and BP, may benefit from switch to ARB in future due to her underlying asthma.  Recommend she monitor BP at least a few mornings a week at home and document.  DASH diet at home.  Labs today: BMP.  Return in 3 months.

## 2021-08-12 NOTE — Assessment & Plan Note (Signed)
Chronic, ongoing, taking weekly supplement.  Continue this supplement and adjust as needed. Vit D level today. 

## 2021-08-12 NOTE — Assessment & Plan Note (Signed)
BMI 38.81, has lost 12 pounds, with T2DM, HLD, HTN.  Recommended eating smaller high protein, low fat meals more frequently and exercising 30 mins a day 5 times a week with a goal of 10-15lb weight loss in the next 3 months. Patient voiced their understanding and motivation to adhere to these recommendations.

## 2021-08-12 NOTE — Assessment & Plan Note (Signed)
Ongoing.  Noted recent labs, will refill supplement for her to take daily as may be beneficial to neuropathy discomfort.

## 2021-08-12 NOTE — Telephone Encounter (Signed)
Copied from [place] [phone]. Topic: General - Other >> Aug 12, 2021  9:55 AM Pawlus, Brayton Layman A wrote: Reason for CRM: Pharmacist from [facility] was calling to clarify Carl, caller stated with the pts insurance it would be better to send in Dexcom due to medicaid. Caller also stated the reader sent in is not compatiable with a mobile phone and needed the Rxs sent in to be compatible so they can be used together.

## 2021-08-12 NOTE — Assessment & Plan Note (Signed)
Chronic, ongoing.  Continue current medication regimen and adjust as needed. Lipid panel today. 

## 2021-08-13 LAB — BASIC METABOLIC PANEL
BUN/Creatinine Ratio: 16 (ref 9–23)
BUN: 10 mg/dL (ref 6–24)
CO2: 22 mmol/L (ref 20–29)
Calcium: 9.8 mg/dL (ref 8.7–10.2)
Chloride: 105 mmol/L (ref 96–106)
Creatinine, Ser: 0.61 mg/dL (ref 0.57–1.00)
Glucose: 167 mg/dL — ABNORMAL HIGH (ref 70–99)
Potassium: 4.9 mmol/L (ref 3.5–5.2)
Sodium: 142 mmol/L (ref 134–144)
eGFR: 113 mL/min/{1.73_m2} (ref >59)

## 2021-08-13 LAB — LIPID PANEL W/O CHOL/HDL RATIO
Cholesterol, Total: 230 mg/dL — ABNORMAL HIGH (ref 100–199)
HDL: 45 mg/dL (ref >39)
LDL Chol Calc (NIH): 149 mg/dL — ABNORMAL HIGH (ref 0–99)
Triglycerides: 197 mg/dL — ABNORMAL HIGH (ref 0–149)
VLDL Cholesterol Cal: 36 mg/dL (ref 5–40)

## 2021-08-13 LAB — VITAMIN D 25 HYDROXY (VIT D DEFICIENCY, FRACTURES): Vit D, 25-Hydroxy: 21 ng/mL — ABNORMAL LOW (ref 30.0–100.0)

## 2021-08-14 ENCOUNTER — Other Ambulatory Visit: Payer: Self-pay | Admitting: Nurse Practitioner

## 2021-08-14 DIAGNOSIS — E1169 Type 2 diabetes mellitus with other specified complication: Secondary | ICD-10-CM

## 2021-08-14 MED ORDER — ROSUVASTATIN CALCIUM 20 MG PO TABS: 20.0000 mg | ORAL_TABLET | Freq: Every day | ORAL | 4 refills | Status: DC

## 2021-08-14 NOTE — Progress Notes (Signed)
Contacted via [place] evening [place], your labs have returned: - Kidney function, creatinine and eGFR, remains normal -- great news!! - Sugar is trending down (glucose). - Cholesterol levels trended up this check.  I am going to increase your Rosuvastatin to 20 MG daily, if you have 10 MG tablets left start taking 2 of them until they are all gone and then start the 20 MG tablet I send in.   - Vitamin D level remains a bit on low side.  Please ensure to take Vitamin D3 supplement.  Any questions? Keep being stellar!!  Thank you for allowing me to participate in your care.  I appreciate you. Kindest regards, Zackry Deines

## 2021-08-16 ENCOUNTER — Telehealth: Payer: Self-pay

## 2021-08-16 NOTE — Telephone Encounter (Signed)
Spoke with [employer]. Prior authorization initiated via [employer] for prescription Trulicity 3 MG. Patient approved from 08/16/21 until 08/10/22.  PA Approval #- [id number]

## 2021-09-15 ENCOUNTER — Telehealth: Payer: Self-pay

## 2021-09-15 NOTE — Telephone Encounter (Signed)
Copied from [place] [id number]. Topic: General - Inquiry >> Sep 15, 2021  3:50 PM Erskine Squibb wrote: Reason for CRM: Patient called in stating she spoke with her pharmacy and they haven't received any prescriptions or information concerning her getting a Dexcom reader. There was a previous CRM from the pharmacist back on May 26 regarding the need for prescriptions to match the new reader. Please assist patient further

## 2021-09-16 MED ORDER — DEXCOM G5 MOB/G4 PLAT SENSOR MISC: 12 refills | Status: DC

## 2021-09-16 MED ORDER — DEXCOM G5 MOBILE RECEIVER DEVI: 2 refills | Status: DC

## 2021-09-19 NOTE — Telephone Encounter (Signed)
Called patient to inform her of Jolene's note. Unable to leave a message for patient as both contact numbers were out of service.   OK for [facility] to give note if patient calls back.

## 2021-09-26 NOTE — Telephone Encounter (Signed)
Paperwork was faxed back to [facility] awaiting determination from [employer].

## 2021-09-27 ENCOUNTER — Other Ambulatory Visit: Payer: Self-pay | Admitting: Nurse Practitioner

## 2021-09-28 NOTE — Telephone Encounter (Signed)
Requested medication (s) are due for refill today -no  Requested medication (s) are on the active medication list -yes  Future visit scheduled -yes  Last refill: 09/16/21 #4 12RF  Notes to clinic: Message from pharmacy: dexcom 5 not avaliable  Requested Prescriptions  Pending Prescriptions Disp Refills   Continuous Blood Gluc Sensor ([place]) [place] [Pharmacy Med Name: [place] 4 each 12    Sig: USE TO CHECK BLOOD SUGAR 4-5 TIMES DAILY     Endocrinology: Diabetes - Testing Supplies Passed - 09/27/2021  4:48 PM      Passed - Valid encounter within last 12 months    Recent Outpatient Visits           1 month ago Type 2 diabetes mellitus with proteinuria ([place])   Hardwick, Jolene T, NP   3 months ago Type 2 diabetes mellitus with diabetic polyneuropathy, with long-term current use of insulin ([place])   Time, Jolene T, NP   4 months ago Type 2 diabetes mellitus with diabetic polyneuropathy, with long-term current use of insulin ([place])   Sawmill, Barbaraann Faster, NP       Future Appointments             In 1 month [name], Barbaraann Faster, NP [employer], [facility]               Requested Prescriptions  Pending Prescriptions Disp Refills   Continuous Blood Gluc Sensor (DEXCOM G6 SENSOR) [place] [Pharmacy Med Name: [place] 4 each 12    Sig: USE TO CHECK BLOOD SUGAR 4-5 TIMES DAILY     Endocrinology: Diabetes - Testing Supplies Passed - 09/27/2021  4:48 PM      Passed - Valid encounter within last 12 months    Recent Outpatient Visits           1 month ago Type 2 diabetes mellitus with proteinuria ([place])   Bruno, Jolene T, NP   3 months ago Type 2 diabetes mellitus with diabetic polyneuropathy, with long-term current use of insulin ([place])   [place] [name], Jolene T, NP   4 months ago Type 2 diabetes mellitus with diabetic  polyneuropathy, with long-term current use of insulin ([place])   Grant, Barbaraann Faster, NP       Future Appointments             In 1 month [name], Barbaraann Faster, NP [employer] [facility], [facility]

## 2021-09-30 ENCOUNTER — Telehealth: Payer: Self-pay

## 2021-09-30 NOTE — Telephone Encounter (Signed)
PA initiated over the phone via [place].  Call back after 24 hours to main number and request to be transferred to pharmacy to check on status of PA. Number is [phone]. PA initiated for Transmitter, Sensor, and Receiver. Interaction ID # : [id number]

## 2021-10-05 NOTE — Telephone Encounter (Signed)
Patient was denied for Dexcom G6, per insurance pharmacy a denial letter has been mailed out to provider.

## 2021-11-12 NOTE — Patient Instructions (Signed)

## 2021-11-14 ENCOUNTER — Ambulatory Visit: Payer: Medicaid Other | Admitting: Nurse Practitioner

## 2021-11-14 ENCOUNTER — Telehealth: Payer: Self-pay | Admitting: Nurse Practitioner

## 2021-11-14 NOTE — Telephone Encounter (Signed)
Pt states a PA is needed for Dulaglutide (TRULICITY) 3 PR/9.4VO SOPN  Pt states she has not had trulicity injection n 2 weeks   Please call pt once PA is initiated

## 2021-11-15 NOTE — Telephone Encounter (Signed)
Attempted to initiated PA on Cover My Meds. Due to insurance that the patient has, PA could not be done there, had to call [place] [employer]. Called [place] [employer], initiated PA for the Trulicity and got an approval. PA number: [id number]. Will call and notify patient.

## 2021-11-15 NOTE — Telephone Encounter (Signed)
Called and LVM notifying patient of approval for Trulicity.

## 2021-11-18 ENCOUNTER — Ambulatory Visit: Payer: Medicaid Other | Admitting: Nurse Practitioner

## 2021-11-18 ENCOUNTER — Encounter: Payer: Self-pay | Admitting: Nurse Practitioner

## 2021-11-18 VITALS — BP 95/62 | HR 75 | Temp 98.2°F | Wt 205.6 lb

## 2021-11-18 DIAGNOSIS — E1129 Type 2 diabetes mellitus with other diabetic kidney complication: Secondary | ICD-10-CM

## 2021-11-18 DIAGNOSIS — E1159 Type 2 diabetes mellitus with other circulatory complications: Secondary | ICD-10-CM

## 2021-11-18 DIAGNOSIS — G63 Polyneuropathy in diseases classified elsewhere: Secondary | ICD-10-CM

## 2021-11-18 DIAGNOSIS — E559 Vitamin D deficiency, unspecified: Secondary | ICD-10-CM

## 2021-11-18 DIAGNOSIS — E1169 Type 2 diabetes mellitus with other specified complication: Secondary | ICD-10-CM | POA: Diagnosis not present

## 2021-11-18 DIAGNOSIS — E1142 Type 2 diabetes mellitus with diabetic polyneuropathy: Secondary | ICD-10-CM | POA: Diagnosis not present

## 2021-11-18 DIAGNOSIS — E785 Hyperlipidemia, unspecified: Secondary | ICD-10-CM

## 2021-11-18 DIAGNOSIS — F419 Anxiety disorder, unspecified: Secondary | ICD-10-CM

## 2021-11-18 DIAGNOSIS — R809 Proteinuria, unspecified: Secondary | ICD-10-CM

## 2021-11-18 DIAGNOSIS — Z794 Long term (current) use of insulin: Secondary | ICD-10-CM

## 2021-11-18 DIAGNOSIS — E538 Deficiency of other specified B group vitamins: Secondary | ICD-10-CM

## 2021-11-18 DIAGNOSIS — G4733 Obstructive sleep apnea (adult) (pediatric): Secondary | ICD-10-CM

## 2021-11-18 DIAGNOSIS — I152 Hypertension secondary to endocrine disorders: Secondary | ICD-10-CM

## 2021-11-18 DIAGNOSIS — E113293 Type 2 diabetes mellitus with mild nonproliferative diabetic retinopathy without macular edema, bilateral: Secondary | ICD-10-CM

## 2021-11-18 DIAGNOSIS — J4530 Mild persistent asthma, uncomplicated: Secondary | ICD-10-CM

## 2021-11-18 LAB — BAYER DCA HB A1C WAIVED: HB A1C (BAYER DCA - WAIVED): 7.8 % — ABNORMAL HIGH (ref 4.8–5.6)

## 2021-11-18 MED ORDER — VITAMIN B-12 1000 MCG PO TABS: 1000.0000 ug | ORAL_TABLET | Freq: Every day | ORAL | 4 refills | Status: AC

## 2021-11-18 MED ORDER — TRULICITY 4.5 MG/0.5ML ~~LOC~~ SOAJ: 4.5000 mg | SUBCUTANEOUS | 4 refills | Status: DC

## 2021-11-18 MED ORDER — TRULICITY 3 MG/0.5ML ~~LOC~~ SOAJ
3.0000 mg | SUBCUTANEOUS | 4 refills | Status: DC
Start: 2021-11-18 — End: 2021-11-18

## 2021-11-18 NOTE — Assessment & Plan Note (Addendum)
BMI 37.60, has lost 16 pounds with Trulicity on board (will attempt to get continued coverage for this), with T2DM, HLD, HTN.  Recommended eating smaller high protein, low fat meals more frequently and exercising 30 mins a day 5 times a week with a goal of 10-15lb weight loss in the next 3 months. Patient voiced their understanding and motivation to adhere to these recommendations.

## 2021-11-18 NOTE — Assessment & Plan Note (Signed)
Ongoing.  Noted recent labs, will refill supplement for her to take daily as may be beneficial to neuropathy discomfort.  She reports pharmacy did not provide this last time.

## 2021-11-18 NOTE — Assessment & Plan Note (Signed)
Chronic, ongoing with improved A1c today 7.8%, previous 7.6%.  Continue Gabapentin and B12 for neuropathy.  Has been unable to get Trulicity for 3 weeks due to coverage, this was working well for her -- will increase to 4.5 MG dosing and work on coverage for this as is losing weight and help to sugars.  At this time continue Glipizide 10 MG BID + Jardiance, and recommend she continue Lantus 30 units, discussed with her that with medication changes if lows present she is to reduce this by 3 units every 3 days if needed = if BS in morning <130 consistently.  Check BS TID and document for visits.  Return in 3 months.

## 2021-11-18 NOTE — Assessment & Plan Note (Signed)
Chronic, ongoing. Was followed by [facility], but PCP currently taking over prescriptions.  Will return to them if any worsening mood.  Denies SI/HI. 

## 2021-11-18 NOTE — Assessment & Plan Note (Signed)
Chronic, ongoing, taking weekly supplement.  Continue this supplement and adjust as needed. Vit D level today.

## 2021-11-18 NOTE — Assessment & Plan Note (Signed)
Chronic, ongoing.  Continue current medication regimen and adjust as needed. Lipid panel today. 

## 2021-11-18 NOTE — Progress Notes (Signed)
BP 95/62   Pulse 75   Temp 98.2 F (36.8 C) (Oral)   Wt 205 lb 9.6 oz (93.3 kg)   LMP 11/03/2021 (Approximate)   SpO2 97%   BMI 37.60 kg/m    Subjective:    Patient ID: Michelle Warner, female    DOB: [birth year], 46 y.o.   MRN: [id number]  HPI: Michelle Warner is a 46 y.o. female  Chief Complaint  Patient presents with   Depression   Diabetes    Eye exam requested from [facility]   Hyperlipidemia   Hypertension   DIABETES Last visit A1c 7.6% in May -- taking Trulicity 3 MG weekly (was able to get and take since May -- has been out of it for 3 weeks as insurance is now denying) and Jardiance 25 MG daily + Glipizide 10 MG BID and Lantus 30 units at night -- goal is to reduce insulin and discontinue if possible in long run. Continues on Gabapentin for neuropathy pain + Vitamin B12 past low levels = ordered B12 last visit but was not at pharmacy.  She also continues Vitamin D supplement weekly for history of low levels.  Has lost 16 pounds since March -- Trulicity works well for her for sugars and weight.  In past took Metformin but this caused major GI issues. Hypoglycemic episodes:no Polydipsia/polyuria: no Visual disturbance: no Chest pain: no Paresthesias: no Glucose Monitoring: yes  Accucheck frequency: Daily  Fasting glucose: 95 to 154  Post prandial:  Evening:  Before meals: Taking Insulin?: yes  Long acting insulin: 30 units Lantus  Short acting insulin: Blood Pressure Monitoring: not checking Retinal Examination: Up to Date -- [facility], getting injections Foot Exam: Up to Date Pneumovax: refuses Influenza: Up to Date Aspirin: no   HYPERTENSION / HYPERLIPIDEMIA Taking Lisinopril 20 MG daily and Crestor 10 MG daily. Satisfied with current treatment? yes Duration of hypertension: chronic BP monitoring frequency: rarely BP range:  BP medication side effects: no Duration of hyperlipidemia: chronic Cholesterol medication side effects: no Cholesterol  supplements: none Medication compliance: good compliance Aspirin: no Recent stressors: no Recurrent headaches: no Visual changes: no Palpitations: no Dyspnea: no Chest pain: no Lower extremity edema: no Dizzy/lightheaded: no   DEPRESSION Continues on Celexa and Trazodone.  Saw [facility] in past and remains stable on medication.  Her significant other is out of work, causing some increased anxiety. Mood status: stable Satisfied with current treatment?: yes Symptom severity: moderate  Duration of current treatment : chronic Side effects: no Medication compliance: good compliance Psychotherapy/counseling: none Depressed mood: no Anxious mood: occasionally Anhedonia: no Significant weight loss or gain: no Insomnia: yes hard to fall asleep Fatigue: yes Feelings of worthlessness or guilt: no Impaired concentration/indecisiveness: no Suicidal ideations: no Hopelessness: no Crying spells: no    11/18/2021    4:08 PM 08/12/2021    9:09 AM 05/05/2021    9:55 AM 01/15/2018   12:50 PM 07/18/2017   11:29 AM  Depression screen PHQ 2/9  Decreased Interest 0 0 0 0 1  Down, Depressed, Hopeless 0 0 0 2 2  PHQ - 2 Score 0 0 0 2 3  Altered sleeping '2 3 1 1 1  '$ Tired, decreased energy '1 3 1 1 1  '$ Change in appetite 0 0 0 1 1  Feeling bad or failure about yourself  0 0 0 0 3  Trouble concentrating 1 0 0 1 1  Moving slowly or fidgety/restless 0 0 0 3 2  Suicidal thoughts  0 0 0 0 0  PHQ-9 Score '4 6 2 9 12  '$ Difficult doing work/chores Not difficult at all   Somewhat difficult Somewhat difficult       11/18/2021    4:08 PM 08/12/2021    9:10 AM 05/05/2021    9:55 AM 01/15/2018   12:49 PM  GAD 7 : Generalized Anxiety Score  Nervous, Anxious, on Edge '2 1 1 3  '$ Control/stop worrying 1 0 0 3  Worry too much - different things 3 0 1 3  Trouble relaxing 1 2 0 1  Restless 0 0 0 1  Easily annoyed or irritable 3 2 0 0  Afraid - awful might happen 0 0 0 1  Total GAD 7 Score '10 5 2 12  '$ Anxiety Difficulty  Not difficult at all Not difficult at all Not difficult at all Somewhat difficult   Relevant past medical, surgical, family and social history reviewed and updated as indicated. Interim medical history since our last visit reviewed. Allergies and medications reviewed and updated.  Review of Systems  Constitutional:  Negative for activity change, appetite change, diaphoresis, fatigue and fever.  Respiratory:  Negative for cough, chest tightness, shortness of breath and wheezing.   Cardiovascular:  Negative for chest pain, palpitations and leg swelling.  Gastrointestinal: Negative.   Endocrine: Negative for cold intolerance, heat intolerance, polydipsia, polyphagia and polyuria.  Neurological: Negative.   Psychiatric/Behavioral: Negative.      Per HPI unless specifically indicated above     Objective:    BP 95/62   Pulse 75   Temp 98.2 F (36.8 C) (Oral)   Wt 205 lb 9.6 oz (93.3 kg)   LMP 11/03/2021 (Approximate)   SpO2 97%   BMI 37.60 kg/m   Wt Readings from Last 3 Encounters:  11/18/21 205 lb 9.6 oz (93.3 kg)  08/12/21 212 lb 3.2 oz (96.3 kg)  06/17/21 221 lb (100.2 kg)    Physical Exam Vitals and nursing note reviewed.  Constitutional:      General: She is awake. She is not in acute distress.    Appearance: She is well-developed and well-groomed. She is obese. She is not ill-appearing or toxic-appearing.  HENT:     Head: Normocephalic.     Right Ear: Hearing normal.     Left Ear: Hearing normal.  Eyes:     General: Lids are normal.        Right eye: No discharge.        Left eye: No discharge.     Pupils: Pupils are equal, round, and reactive to light.     Funduscopic exam:    Right eye: No hemorrhage.        Left eye: No hemorrhage.  Neck:     Thyroid: No thyromegaly.     Vascular: No carotid bruit.  Cardiovascular:     Rate and Rhythm: Normal rate and regular rhythm.     Heart sounds: Normal heart sounds. No murmur heard.    No gallop.  Pulmonary:      Effort: Pulmonary effort is normal. No accessory muscle usage or respiratory distress.     Breath sounds: Normal breath sounds.  Abdominal:     General: Bowel sounds are normal.     Palpations: Abdomen is soft.  Musculoskeletal:     Cervical back: Normal range of motion and neck supple.     Right lower leg: No edema.     Left lower leg: No edema.  Lymphadenopathy:  Head:     Right side of head: No submental, submandibular, tonsillar, preauricular or posterior auricular adenopathy.     Left side of head: No submental, submandibular, tonsillar, preauricular or posterior auricular adenopathy.     Cervical: No cervical adenopathy.  Skin:    General: Skin is warm and dry.  Neurological:     Mental Status: She is alert and oriented to person, place, and time.  Psychiatric:        Attention and Perception: Attention normal.        Mood and Affect: Mood normal.        Speech: Speech normal.        Behavior: Behavior normal. Behavior is cooperative.        Thought Content: Thought content normal.     Results for orders placed or performed in visit on 11/18/21  Bayer DCA Hb A1c Waived  Result Value Ref Range   HB A1C (BAYER DCA - WAIVED) 7.8 (H) 4.8 - 5.6 %      Assessment & Plan:   Problem List Items Addressed This Visit       Cardiovascular and Mediastinum   Hypertension associated with diabetes ([place])    Chronic, stable with BP well below goal in office.  Currently on Lisinopril for kidney protection and BP, may benefit from switch to ARB in future due to her underlying asthma.  Recommend she monitor BP at least a few mornings a week at home and document.  DASH diet at home.  Labs today: CBC and CMP.  Return in 3 months.       Relevant Medications   Dulaglutide (TRULICITY) 4.5 CH/8.8FO SOPN   Other Relevant Orders   Bayer DCA Hb A1c Waived (Completed)   Comprehensive metabolic panel     Endocrine   Type 2 diabetes mellitus with diabetic polyneuropathy, with long-term  current use of insulin (HCC)    Chronic, ongoing with improved A1c today 7.8%, previous 7.6%.  Continue Gabapentin and B12 for neuropathy.  Has been unable to get Trulicity for 3 weeks due to coverage, this was working well for her -- will increase to 4.5 MG dosing and work on coverage for this as is losing weight and help to sugars.  At this time continue Glipizide 10 MG BID + Jardiance, and recommend she continue Lantus 30 units, discussed with her that with medication changes if lows present she is to reduce this by 3 units every 3 days if needed = if BS in morning <130 consistently.  Check BS TID and document for visits.  Return in 3 months.      Relevant Medications   Dulaglutide (TRULICITY) 4.5 YD/7.4JO SOPN   Other Relevant Orders   Bayer DCA Hb A1c Waived (Completed)   Comprehensive metabolic panel   Hyperlipidemia associated with type 2 diabetes mellitus (HCC)    Chronic, ongoing.  Continue current medication regimen and adjust as needed.  Lipid panel today.      Relevant Medications   Dulaglutide (TRULICITY) 4.5 IN/8.6VE SOPN   Other Relevant Orders   Bayer DCA Hb A1c Waived (Completed)   Comprehensive metabolic panel   Lipid Panel w/o Chol/HDL Ratio   Type 2 diabetes mellitus with proteinuria ([place]) - Primary    Refer to diabetes with neuropathy plan of care and continue Lisinopril for kidney protection.      Relevant Medications   Dulaglutide (TRULICITY) 4.5 HM/0.9OB SOPN   Other Relevant Orders   Bayer DCA Hb A1c Waived (Completed)  Comprehensive metabolic panel     Nervous and Auditory   Vitamin B12 deficiency neuropathy (HCC)    Ongoing.  Noted recent labs, will refill supplement for her to take daily as may be beneficial to neuropathy discomfort.  She reports pharmacy did not provide this last time.      Relevant Orders   CBC with Differential/Platelet   Vitamin B12     Other   Anxiety    Chronic, ongoing. Was followed by [facility], but PCP currently taking over  prescriptions.  Will return to them if any worsening mood.  Denies SI/HI.      Morbid obesity (HCC)    BMI 37.60, has lost 16 pounds with Trulicity on board (will attempt to get continued coverage for this), with T2DM, HLD, HTN.  Recommended eating smaller high protein, low fat meals more frequently and exercising 30 mins a day 5 times a week with a goal of 10-15lb weight loss in the next 3 months. Patient voiced their understanding and motivation to adhere to these recommendations.       Relevant Medications   Dulaglutide (TRULICITY) 4.5 SH/6.8HF SOPN   Vitamin D deficiency    Chronic, ongoing, taking weekly supplement.  Continue this supplement and adjust as needed. Vit D level today.      Relevant Orders   VITAMIN D 25 Hydroxy (Vit-D Deficiency, Fractures)     Follow up plan: Return in about 3 months (around 02/17/2022) for T2DM, HTN/HLD, GERD, MOOD, OSA.

## 2021-11-18 NOTE — Assessment & Plan Note (Signed)
Refer to diabetes with neuropathy plan of care and continue Lisinopril for kidney protection. 

## 2021-11-18 NOTE — Assessment & Plan Note (Signed)
Chronic, stable with BP well below goal in office.  Currently on Lisinopril for kidney protection and BP, may benefit from switch to ARB in future due to her underlying asthma.  Recommend she monitor BP at least a few mornings a week at home and document.  DASH diet at home.  Labs today: CBC and CMP.  Return in 3 months.

## 2021-11-19 LAB — CBC WITH DIFFERENTIAL/PLATELET
Basophils Absolute: 0 10*3/uL (ref 0.0–0.2)
Basos: 0 %
EOS (ABSOLUTE): 0.1 10*3/uL (ref 0.0–0.4)
Eos: 1 %
Hematocrit: 41 % (ref 34.0–46.6)
Hemoglobin: 13.7 g/dL (ref 11.1–15.9)
Immature Grans (Abs): 0 10*3/uL (ref 0.0–0.1)
Immature Granulocytes: 0 %
Lymphocytes Absolute: 2.1 10*3/uL (ref 0.7–3.1)
Lymphs: 27 %
MCH: 28.9 pg (ref 26.6–33.0)
MCHC: 33.4 g/dL (ref 31.5–35.7)
MCV: 87 fL (ref 79–97)
Monocytes Absolute: 0.4 10*3/uL (ref 0.1–0.9)
Monocytes: 5 %
Neutrophils Absolute: 5.4 10*3/uL (ref 1.4–7.0)
Neutrophils: 67 %
Platelets: 356 10*3/uL (ref 150–450)
RBC: 4.74 x10E6/uL (ref 3.77–5.28)
RDW: 12.8 % (ref 11.7–15.4)
WBC: 8 10*3/uL (ref 3.4–10.8)

## 2021-11-19 LAB — COMPREHENSIVE METABOLIC PANEL WITH GFR
ALT: 12 IU/L (ref 0–32)
AST: 12 IU/L (ref 0–40)
Albumin/Globulin Ratio: 1.6 (ref 1.2–2.2)
Albumin: 4.5 g/dL (ref 3.9–4.9)
Alkaline Phosphatase: 69 IU/L (ref 44–121)
BUN/Creatinine Ratio: 20 (ref 9–23)
BUN: 13 mg/dL (ref 6–24)
Bilirubin Total: 0.3 mg/dL (ref 0.0–1.2)
CO2: 21 mmol/L (ref 20–29)
Calcium: 9.4 mg/dL (ref 8.7–10.2)
Chloride: 100 mmol/L (ref 96–106)
Creatinine, Ser: 0.66 mg/dL (ref 0.57–1.00)
Globulin, Total: 2.8 g/dL (ref 1.5–4.5)
Glucose: 160 mg/dL — ABNORMAL HIGH (ref 70–99)
Potassium: 4.8 mmol/L (ref 3.5–5.2)
Sodium: 138 mmol/L (ref 134–144)
Total Protein: 7.3 g/dL (ref 6.0–8.5)
eGFR: 111 mL/min/1.73

## 2021-11-19 LAB — LIPID PANEL W/O CHOL/HDL RATIO
Cholesterol, Total: 158 mg/dL (ref 100–199)
HDL: 49 mg/dL (ref >39)
LDL Chol Calc (NIH): 83 mg/dL (ref 0–99)
Triglycerides: 147 mg/dL (ref 0–149)
VLDL Cholesterol Cal: 26 mg/dL (ref 5–40)

## 2021-11-19 LAB — VITAMIN D 25 HYDROXY (VIT D DEFICIENCY, FRACTURES): Vit D, 25-Hydroxy: 36 ng/mL (ref 30.0–100.0)

## 2021-11-19 LAB — VITAMIN B12: Vitamin B-12: 348 pg/mL (ref 232–1245)

## 2021-11-20 NOTE — Progress Notes (Signed)
Contacted via [place] morning [place], your labs have returned: - Kidney function, creatinine and eGFR, remains normal, as is liver function, AST and ALT.   - B12 remains on lower side normal, please ensure to start the supplement daily that I sent into [facility].  Let me know if any issues obtaining. - Cholesterol labs normal and good range. - Vitamin D level improving, continue supplement. - CBC shows normal levels.  Any questions? Keep being amazing!!  Thank you for allowing me to participate in your care.  I appreciate you. Kindest regards, Armya Westerhoff

## 2021-11-22 NOTE — Telephone Encounter (Signed)
Pt is calling to report that the pharmacy is still saying that her Dulaglutide (TRULICITY) 4.5 RA/1.5HI SOPN [[id number]] is not approved. Pt reports that she has not had medication in a whole month. Please advise [phone]

## 2021-11-23 NOTE — Telephone Encounter (Signed)
Called [facility] and completed PA for Trulicity 4.5 mg. Medication is approved from 11/23/21 - 11/18/22. PA number is [id number].  Will call pharmacy and have them run the RX to ensure that it goes through.

## 2021-11-23 NOTE — Telephone Encounter (Signed)
Called and spoke with Michelle Warner at [facility]. Medication is going through for $4.   Called and notified patient of approval and the above information from the pharmacy.

## 2021-12-08 ENCOUNTER — Other Ambulatory Visit: Payer: Self-pay | Admitting: Nurse Practitioner

## 2021-12-08 MED ORDER — FIFTY50 PEN NEEDLES 31G X 8 MM MISC: 11 refills | Status: DC

## 2021-12-08 NOTE — Telephone Encounter (Signed)
Medication Refill - Medication: Rx Insulin Pen Needle (FIFTY50 PEN NEEDLES) 31G X 8 MM MISC / pt is completley out   Has the patient contacted their pharmacy? Yes.   (Agent: If no, request that the patient contact the pharmacy for the refill. If patient does not wish to contact the pharmacy document the reason why and proceed with request.) (Agent: If yes, when and what did the pharmacy advise?)  Preferred Pharmacy (with phone number or street name): [facility] [address]  Has the patient been seen for an appointment in the last year OR does the patient have an upcoming appointment? Yes.    Agent: Please be advised that RX refills may take up to 3 business days. We ask that you follow-up with your pharmacy.

## 2021-12-08 NOTE — Telephone Encounter (Signed)
Requested Prescriptions  Pending Prescriptions Disp Refills  . Insulin Pen Needle (FIFTY50 PEN NEEDLES) 31G X 8 MM MISC 100 each 11    Sig: Injection Frequency is 1 time per day; Dx Code: Type 2 Diabetes uncontrolled (E11.65)     Endocrinology: Diabetes - Testing Supplies Passed - 12/08/2021  3:58 PM      Passed - Valid encounter within last 12 months    Recent Outpatient Visits          2 weeks ago Type 2 diabetes mellitus with proteinuria ([place])   [place], Jolene T, NP   3 months ago Type 2 diabetes mellitus with proteinuria ([place])   [place], Jolene T, NP   6 months ago Type 2 diabetes mellitus with diabetic polyneuropathy, with long-term current use of insulin ([place])   [place], Jolene T, NP   7 months ago Type 2 diabetes mellitus with diabetic polyneuropathy, with long-term current use of insulin ([place])   [place], Barbaraann Faster, NP      Future Appointments            In 2 months [name], Barbaraann Faster, NP [employer]

## 2022-01-12 ENCOUNTER — Other Ambulatory Visit: Payer: Self-pay

## 2022-01-12 ENCOUNTER — Emergency Department: Payer: Medicaid Other

## 2022-01-12 ENCOUNTER — Emergency Department
Admission: EM | Admit: 2022-01-12 | Discharge: 2022-01-12 | Disposition: A | Discharge status: Home / Self Care | Payer: Medicaid Other | Attending: Emergency Medicine | Admitting: Emergency Medicine

## 2022-01-12 ENCOUNTER — Encounter: Payer: Self-pay | Admitting: Emergency Medicine

## 2022-01-12 DIAGNOSIS — I10 Essential (primary) hypertension: Secondary | ICD-10-CM | POA: Insufficient documentation

## 2022-01-12 DIAGNOSIS — T192XXA Foreign body in vulva and vagina, initial encounter: Secondary | ICD-10-CM | POA: Diagnosis present

## 2022-01-12 DIAGNOSIS — E119 Type 2 diabetes mellitus without complications: Secondary | ICD-10-CM | POA: Diagnosis not present

## 2022-01-12 DIAGNOSIS — X58XXXA Exposure to other specified factors, initial encounter: Secondary | ICD-10-CM | POA: Diagnosis not present

## 2022-01-12 DIAGNOSIS — Z30432 Encounter for removal of intrauterine contraceptive device: Secondary | ICD-10-CM | POA: Insufficient documentation

## 2022-01-12 LAB — POC URINE PREG, ED: Preg Test, Ur: NEGATIVE

## 2022-01-12 NOTE — ED Provider Notes (Signed)
[facility] Provider Note    Event Date/Time   First MD Initiated Contact with Patient 01/12/22 1245     (approximate)   History   IUD Problem   HPI  Michelle Warner is a 46 y.o. female history of diabetes, hypertension presents to the emergency department stating that she felt like her IUD slipped out partially last night.  She can feel something hard and sticking into her out of her cervix.  Called her doctor and they told her to come the ED as they could not see her.  States that we may have to remove the IUD.      Physical Exam   Triage Vital Signs: ED Triage Vitals  Enc Vitals Group     BP 01/12/22 1158 115/77     Pulse Rate 01/12/22 1158 80     Resp 01/12/22 1158 18     Temp 01/12/22 1158 97.7 F (36.5 C)     Temp Source 01/12/22 1158 Oral     SpO2 01/12/22 1158 98 %     Weight 01/12/22 1156 208 lb (94.3 kg)     Height 01/12/22 1156 '5\' 2"'$  (1.575 m)     Head Circumference --      Peak Flow --      Pain Score 01/12/22 1156 8     Pain Loc --      Pain Edu? --      Excl. in [place]? --     Most recent vital signs: Vitals:   01/12/22 1158  BP: 115/77  Pulse: 80  Resp: 18  Temp: 97.7 F (36.5 C)  SpO2: 98%     General: Awake, no distress.   CV:  Good peripheral perfusion. regular rate and  rhythm Resp:  Normal effort.  Abd:  No distention.   Other:  Vaginal exam shows 2 strings from the IUD, cannot see the actual IUD,   ED Results / Procedures / Treatments   Labs (all labs ordered are listed, but only abnormal results are displayed) Labs Reviewed  POC URINE PREG, ED     EKG     RADIOLOGY Ultrasound pelvis foreign body/IUD placement    PROCEDURES:   Procedures   MEDICATIONS ORDERED IN ED: Medications - No data to display   IMPRESSION / MDM / [place] / ED COURSE  I reviewed the triage vital signs and the nursing notes.                              Differential diagnosis includes, but is not  limited to, IUD removal, IUD misplacement, feared complaint without diagnosis  Patient's presentation is most consistent with acute complicated illness / injury requiring diagnostic workup.   Ultrasound of the pelvis independently reviewed and interpreted by me as being negative for IUD.  Did explain this to the patient.  Therefore the strings that we noted must mean that the IUD is in the vaginal vault, this was removed with round forceps, patient tolerated procedure well.  She was cautioned to use additional birth control as she will no longer have protection from her IUD.  Follow-up with her regular doctor for repeat IUD placement.  Patient is in agreement with treatment plan.  Discharged stable condition.      FINAL CLINICAL IMPRESSION(S) / ED DIAGNOSES   Final diagnoses:  Encounter for IUD removal  Vaginal foreign body, initial encounter     Rx /  DC Orders   ED Discharge Orders     None        Note:  This document was prepared using Dragon voice recognition software and may include unintentional dictation errors.    Versie Starks, PA-C 01/12/22 1545    Lavonia Drafts, MD 01/17/22 [phone]

## 2022-01-12 NOTE — Discharge Instructions (Signed)
Follow-up with your regular doctor.  Please call for an appointment.  You will need to use birth control such as condoms as your IUD is no longer in your uterus preventing you from getting pregnant

## 2022-01-12 NOTE — ED Triage Notes (Signed)
Patient to ED for IUD problem. Patient states her IUD has moved and not in the right place. Patient unable to get in with OB today.

## 2022-01-12 NOTE — ED Notes (Signed)
64 yof with a c/c of her IUD sticking out since last night. The pt is also c/c of some abdominal cramping. IUD has been placed for almost 10 years.

## 2022-01-12 NOTE — ED Notes (Signed)
Urine sent to lab if needed 

## 2022-01-13 ENCOUNTER — Encounter: Payer: Self-pay | Admitting: Nurse Practitioner

## 2022-01-13 ENCOUNTER — Telehealth: Payer: Self-pay

## 2022-01-13 NOTE — Telephone Encounter (Signed)
Transition Care Management Unsuccessful Follow-up Telephone Call  Date of discharge and from where:  01/12/22, [facility]  Attempts:  1st Attempt  Reason for unsuccessful TCM follow-up call:  Left voice message

## 2022-01-16 ENCOUNTER — Encounter: Payer: Self-pay | Admitting: Nurse Practitioner

## 2022-01-16 NOTE — Telephone Encounter (Signed)
Transition Care Management Unsuccessful Follow-up Telephone Call  Date of discharge and from where:  01/12/22, [facility]  Attempts:  2nd Attempt  Reason for unsuccessful TCM follow-up call:  Left voice message

## 2022-01-16 NOTE — Telephone Encounter (Signed)
Transition Care Management Follow-up Telephone Call Date of discharge and from where: 01/12/22, [facility] How have you been since you were released from the hospital? Good Any questions or concerns? No  Items Reviewed: Did the pt receive and understand the discharge instructions provided? Yes  Medications obtained and verified? Yes  Other? No  Any new allergies since your discharge? No  Dietary orders reviewed? Yes Do you have support at home? Yes   Home Care and Equipment/Supplies: Were home health services ordered? no If so, what is the name of the agency? N/A  Has the agency set up a time to come to the patient's home? not applicable Were any new equipment or medical supplies ordered?  No What is the name of the medical supply agency? N/A Were you able to get the supplies/equipment? not applicable Do you have any questions related to the use of the equipment or supplies? No  Functional Questionnaire: (I = Independent and D = Dependent) ADLs: I  Bathing/Dressing- I  Meal Prep- I  Eating- I  Maintaining continence- I  Transferring/Ambulation- I  Managing Meds- I  Follow up appointments reviewed:  PCP Hospital f/u appt confirmed? No . Patient states she does not need anything from [place] at this time.  [facility] f/u appt confirmed? Yes  Scheduled to see OBGYN on 01/18/22 @ 2:15 PM. Are transportation arrangements needed? No  If their condition worsens, is the pt aware to call PCP or go to the Emergency Dept.? Yes Was the patient provided with contact information for the PCP's office or ED? Yes Was to pt encouraged to call back with questions or concerns? Yes

## 2022-01-17 IMAGING — US US PELVIS COMPLETE TRANSABD/TRANSVAG W DUPLEX
1 series · 14 of 25 positions shown · non-contrast
Comparison: None.

CLINICAL DATA: Left flank pain x1 month, IUD in lower uterine
segment on CT

EXAM:
TRANSABDOMINAL AND TRANSVAGINAL ULTRASOUND OF PELVIS
TECHNIQUE: Both transabdominal and transvaginal ultrasound examinations of the
pelvis were performed. Transabdominal technique was performed for
global imaging of the pelvis including uterus, ovaries, adnexal
regions, and pelvic cul-de-sac.
It was necessary to proceed with endovaginal exam following the
transabdominal exam to visualize the endometrium and bilateral
adnexa.

[Series 1: us pelvic complete w transvaginal and torsion righ · 14 of 73 slices shown]
[im 1/73]
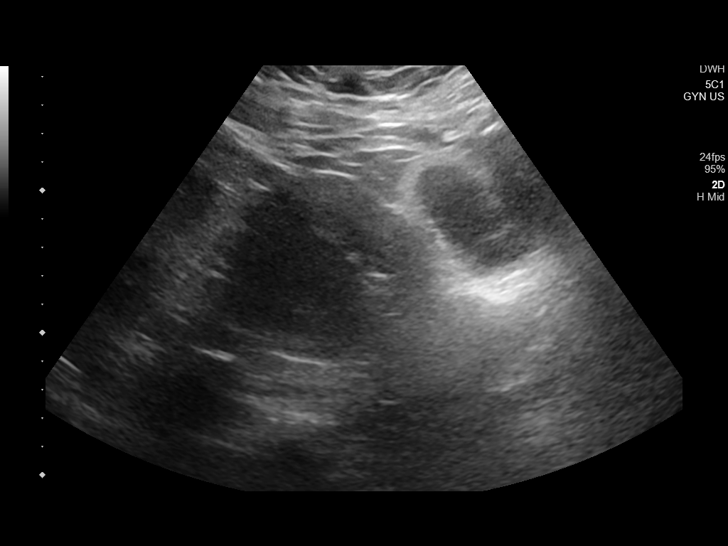
[im 7/73]
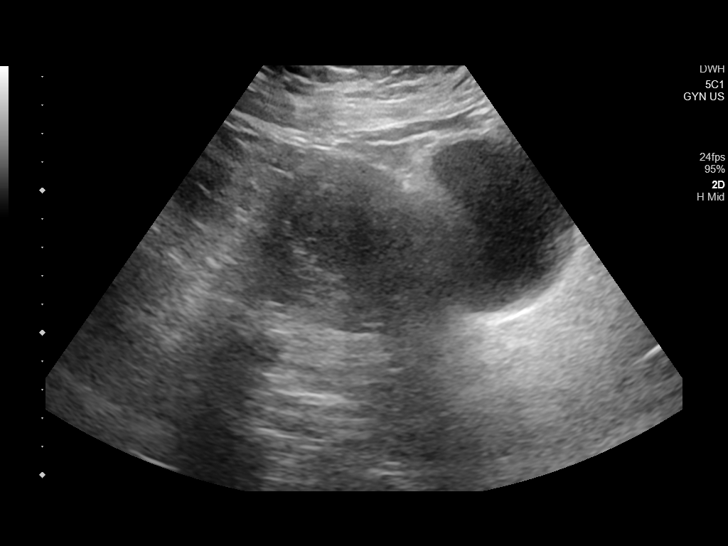
[im 13/73]
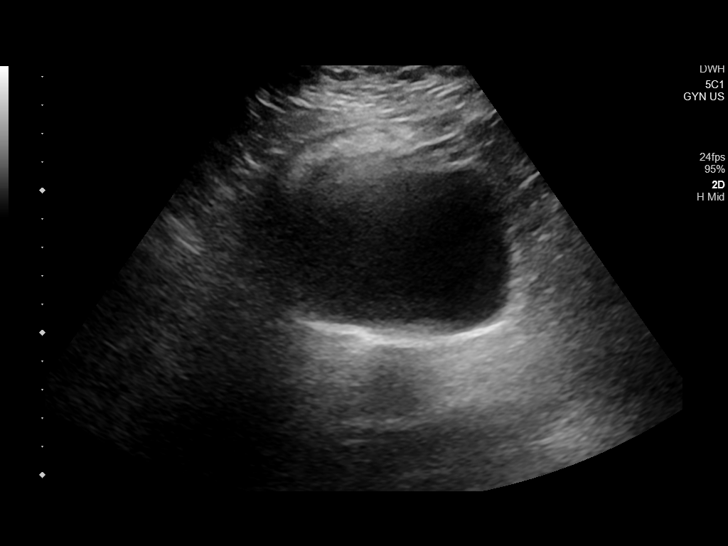
[im 19/73]
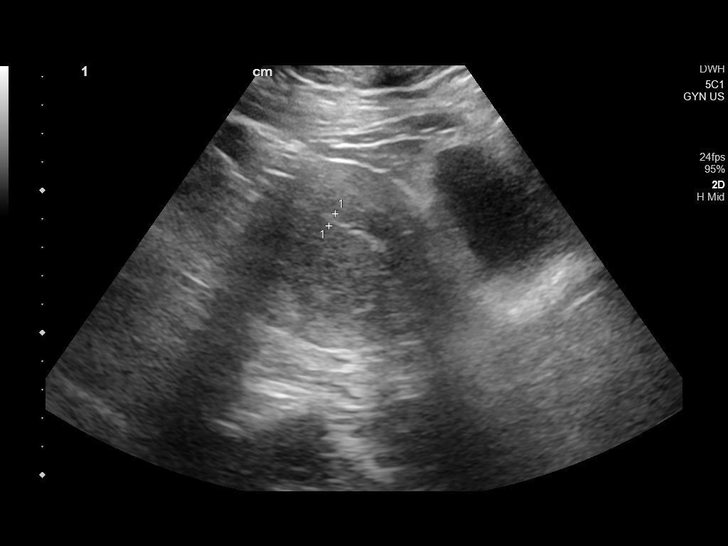
[im 25/73]
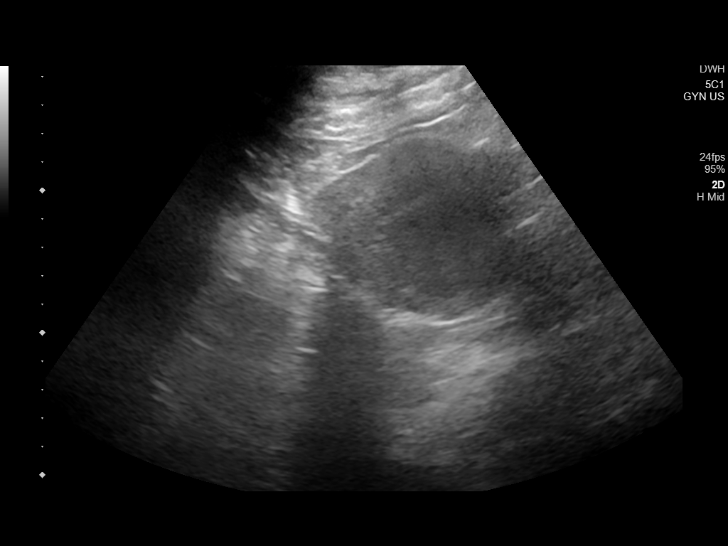
[im 28/73]
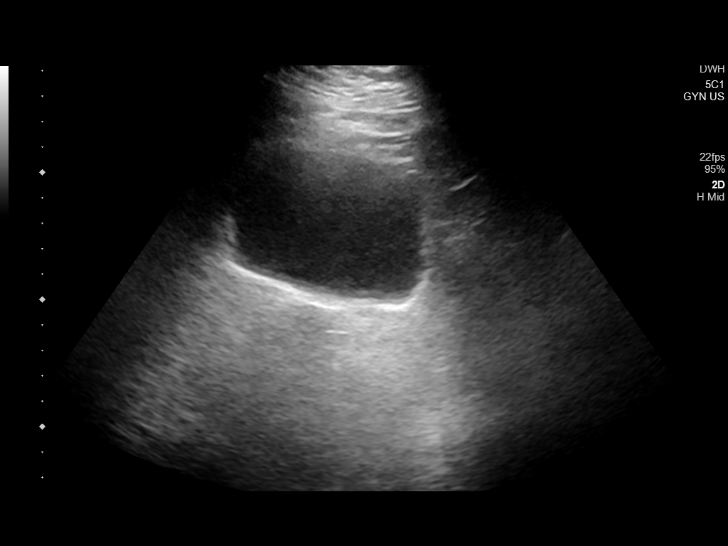
[im 34/73]
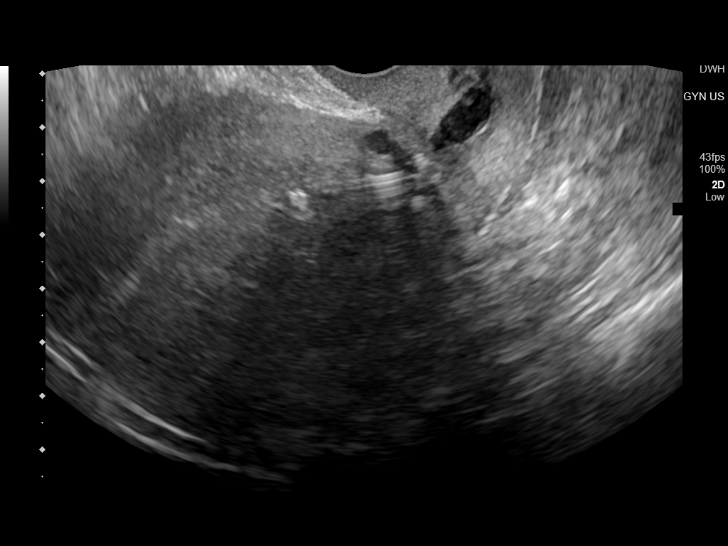
[im 40/73]
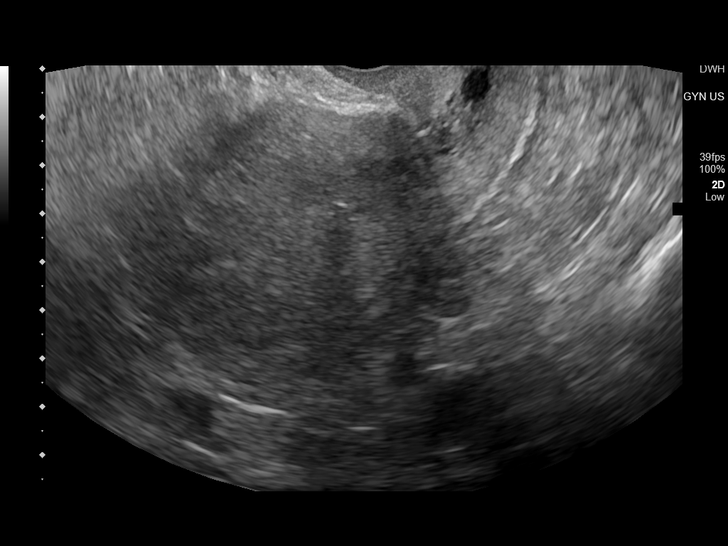
[im 46/73]
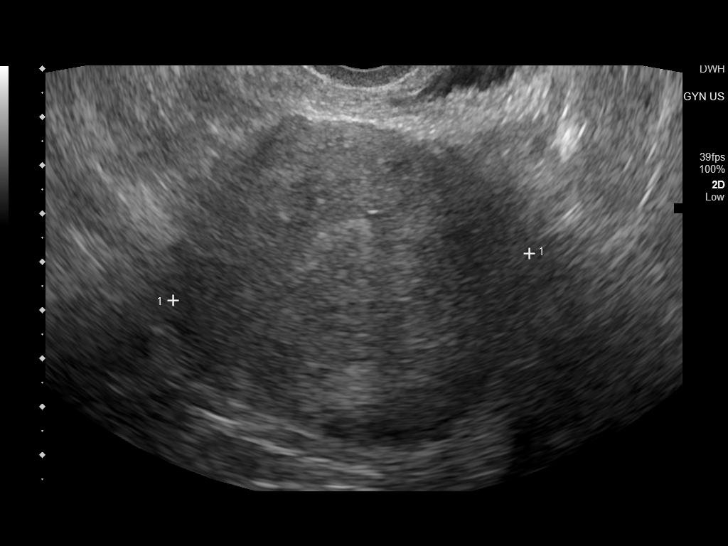
[im 49/73]
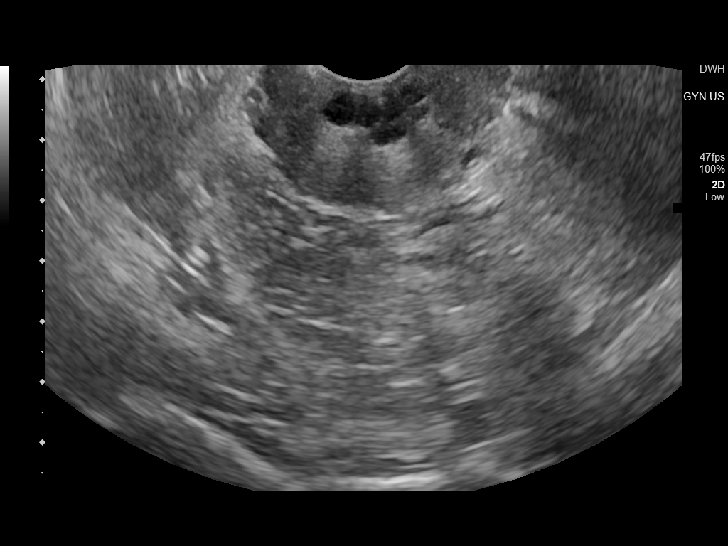
[im 55/73]
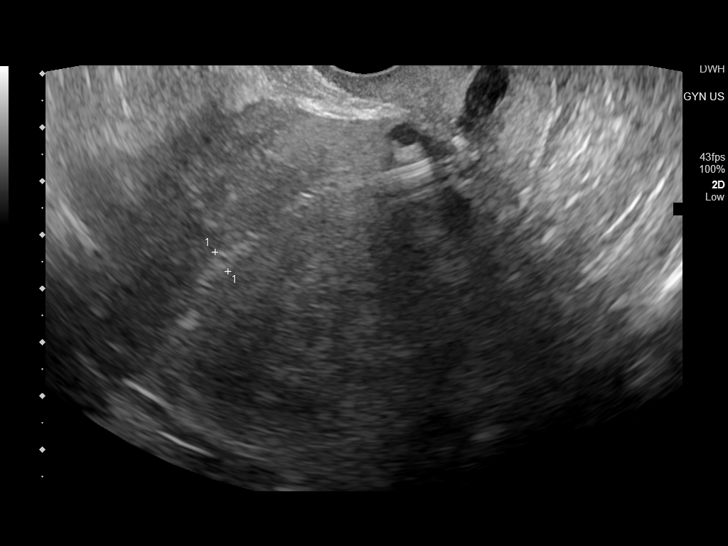
[im 61/73]
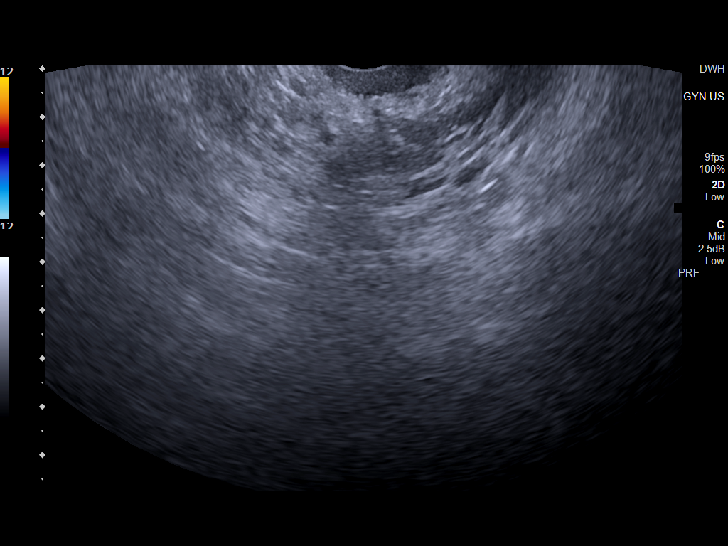
[im 67/73]
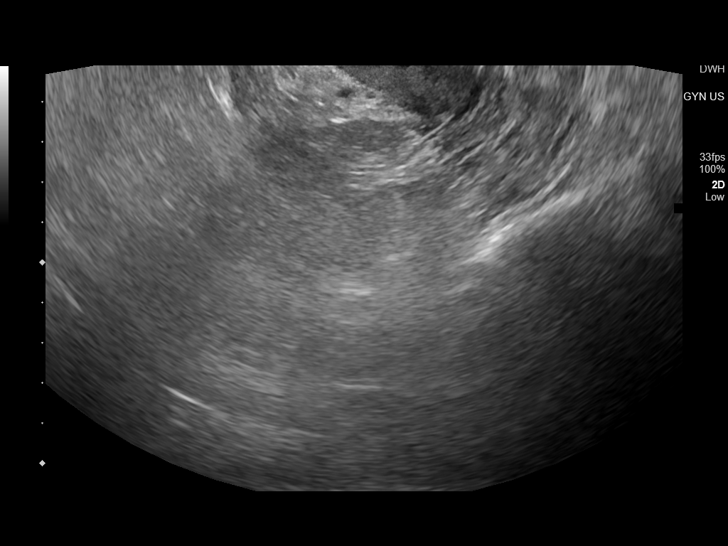
[im 73/73]
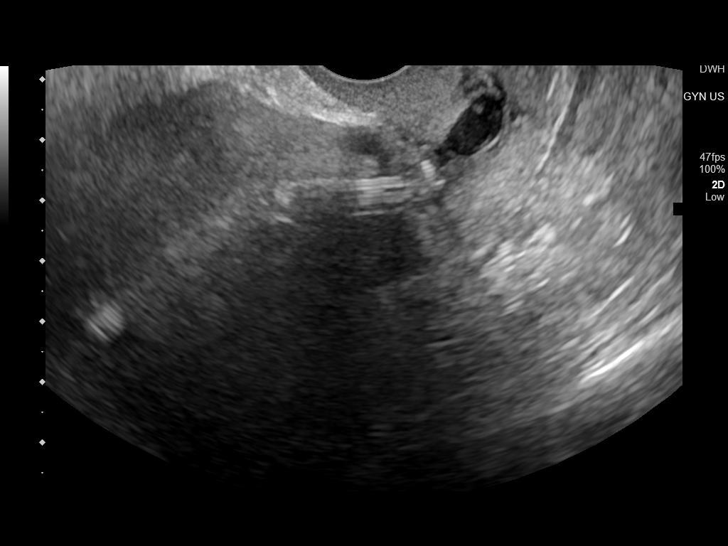

[14 of 25 positions shown; findings below may reference images not displayed]

FINDINGS: Uterus

Measurements: 10.5 x 6.3 x 7.4 cm = volume: 254 mL. Dominant 3.4 x
2.4 x 2.4 cm intramural fibroid in the posterior uterine body.

Endometrium

Thickness: 5 mm.  IUD in the lower uterine segment.

Right ovary

Not discretely visualized.  No adnexal mass is seen.

Left ovary

Not discretely visualized.  No adnexal mass is seen.

Doppler evaluation could not be performed due to nonvisualization of
the bilateral ovaries.

Other findings

No abnormal free fluid.
IMPRESSION: IUD in the lower uterine segment.

3.4 cm intramural fibroid in the posterior uterine body.

No adnexal mass is seen.

## 2022-01-18 ENCOUNTER — Ambulatory Visit (INDEPENDENT_AMBULATORY_CARE_PROVIDER_SITE_OTHER): Payer: Medicaid Other | Admitting: Certified Nurse Midwife

## 2022-01-18 ENCOUNTER — Encounter: Payer: Self-pay | Admitting: Certified Nurse Midwife

## 2022-01-18 VITALS — BP 122/82 | HR 77 | Resp 16 | Wt 202.0 lb

## 2022-01-18 DIAGNOSIS — Z3202 Encounter for pregnancy test, result negative: Secondary | ICD-10-CM | POA: Diagnosis not present

## 2022-01-18 DIAGNOSIS — Z30430 Encounter for insertion of intrauterine contraceptive device: Secondary | ICD-10-CM

## 2022-01-18 LAB — POCT URINE PREGNANCY: Preg Test, Ur: NEGATIVE

## 2022-01-18 NOTE — Patient Instructions (Signed)

## 2022-01-18 NOTE — Progress Notes (Signed)
  GYNECOLOGY OFFICE PROCEDURE NOTE  HOLLEE FATE is a 46 y.o. No obstetric history on file. here for mirena IUD insertion. No GYN concerns.  Last pap smear was on 08/25/2019 and was normal. Given pt history of IUD coming out spontaneously x2, discussed likely   IUD Insertion Procedure Note Patient identified, informed consent performed, consent signed.   Discussed risks of irregular bleeding, cramping, infection, malpositioning or misplacement of the IUD outside the uterus which may require further procedure such as laparoscopy. Also discussed >99% contraception efficacy, increased risk of ectopic pregnancy with failure of method.   Emphasized that this did not protect against STIs, condoms recommended during all sexual encounters. Time out was performed.  Urine pregnancy test negative.  Speculum placed in the vagina.  Cervix visualized.  Cleaned with Betadine x 2.  Grasped anteriorly with a single tooth tenaculum.  Uterus sounded to 8 cm.  Mirena IUD placed per manufacturer's recommendations.  Strings trimmed to 3 cm. Tenaculum was removed, good hemostasis noted.  Patient tolerated procedure well.   Patient was given post-procedure instructions.  She was advised to have backup contraception for one week.  Patient was also asked to check IUD strings periodically and follow up in 4 weeks for IUD check.   Philip Aspen, CNM

## 2022-01-19 ENCOUNTER — Encounter: Payer: Self-pay | Admitting: Certified Nurse Midwife

## 2022-02-13 ENCOUNTER — Ambulatory Visit: Payer: Medicaid Other | Admitting: Licensed Practical Nurse

## 2022-02-17 ENCOUNTER — Ambulatory Visit: Payer: Medicaid Other | Admitting: Nurse Practitioner

## 2022-02-19 NOTE — Patient Instructions (Incomplete)
Please call to schedule your mammogram and/or bone density: [facility]  Address: [address], [place], [place] [address] Phone: [phone]  [facility] [place] [address] [place],  [place]  [address] Phone: [phone]    Diabetes Mellitus Basics  Diabetes mellitus, or diabetes, is a long-term (chronic) disease. It occurs when the body does not properly use sugar (glucose) that is released from food after you eat. Diabetes mellitus may be caused by one or both of these problems: Your pancreas does not make enough of a hormone called insulin. Your body does not react in a normal way to the insulin that it makes. Insulin lets glucose enter cells in your body. This gives you energy. If you have diabetes, glucose cannot get into cells. This causes high blood glucose (hyperglycemia). How to treat and manage diabetes You may need to take insulin or other diabetes medicines daily to keep your glucose in balance. If you are prescribed insulin, you will learn how to give yourself insulin by injection. You may need to adjust the amount of insulin you take based on the foods that you eat. You will need to check your blood glucose levels using a glucose monitor as told by your health care provider. The readings can help determine if you have low or high blood glucose. Generally, you should have these blood glucose levels: Before meals (preprandial): 80-130 mg/dL (4.4-7.2 mmol/L). After meals (postprandial): below 180 mg/dL (10 mmol/L). Hemoglobin A1c (HbA1c) level: less than 7%. Your health care provider will set treatment goals for you. Keep all follow-up visits. This is important. Follow these instructions at home: Diabetes medicines Take your diabetes medicines every day as told by your health care provider. List your diabetes medicines here: Name of medicine: ______________________________ Amount (dose):  _______________ Time (a.m./p.m.): _______________ Notes: ___________________________________ Name of medicine: ______________________________ Amount (dose): _______________ Time (a.m./p.m.): _______________ Notes: ___________________________________ Name of medicine: ______________________________ Amount (dose): _______________ Time (a.m./p.m.): _______________ Notes: ___________________________________ Insulin If you use insulin, list the types of insulin you use here: Insulin type: ______________________________ Amount (dose): _______________ Time (a.m./p.m.): _______________Notes: ___________________________________ Insulin type: ______________________________ Amount (dose): _______________ Time (a.m./p.m.): _______________ Notes: ___________________________________ Insulin type: ______________________________ Amount (dose): _______________ Time (a.m./p.m.): _______________ Notes: ___________________________________ Insulin type: ______________________________ Amount (dose): _______________ Time (a.m./p.m.): _______________ Notes: ___________________________________ Insulin type: ______________________________ Amount (dose): _______________ Time (a.m./p.m.): _______________ Notes: ___________________________________ Managing blood glucose  Check your blood glucose levels using a glucose monitor as told by your health care provider. Write down the times that you check your glucose levels here: Time: _______________ Notes: ___________________________________ Time: _______________ Notes: ___________________________________ Time: _______________ Notes: ___________________________________ Time: _______________ Notes: ___________________________________ Time: _______________ Notes: ___________________________________ Time: _______________ Notes: ___________________________________  Low blood glucose Low blood glucose (hypoglycemia) is when glucose is at or below 70 mg/dL (3.9 mmol/L).  Symptoms may include: Feeling: Hungry. Sweaty and clammy. Irritable or easily upset. Dizzy. Sleepy. Having: A fast heartbeat. A headache. A change in your vision. Numbness around the mouth, lips, or tongue. Having trouble with: Moving (coordination). Sleeping. Treating low blood glucose To treat low blood glucose, eat or drink something containing sugar right away. If you can think clearly and swallow safely, follow the 15:15 rule: Take 15 grams of a fast-acting carb (carbohydrate), as told by your health care provider. Some fast-acting carbs are: Glucose tablets: take 3-4 tablets. Hard candy: eat 3-5 pieces. Fruit juice: drink 4 oz (120 mL). Regular (not diet) soda: drink 4-6 oz (120-180 mL).   Honey or sugar: eat 1 Tbsp (15 mL). Check your blood glucose levels 15 minutes after you take the carb. If your glucose is still at or below 70 mg/dL (3.9 mmol/L), take 15 grams of a carb again. If your glucose does not go above 70 mg/dL (3.9 mmol/L) after 3 tries, get help right away. After your glucose goes back to normal, eat a meal or a snack within 1 hour. Treating very low blood glucose If your glucose is at or below 54 mg/dL (3 mmol/L), you have very low blood glucose (severe hypoglycemia). This is an emergency. Do not wait to see if the symptoms will go away. Get medical help right away. Call your local emergency services (911 in the [place] Do not drive yourself to the hospital. Questions to ask your health care provider Should I talk with a diabetes educator? What equipment will I need to care for myself at home? What diabetes medicines do I need? When should I take them? How often do I need to check my blood glucose levels? What number can I call if I have questions? When is my follow-up visit? Where can I find a support group for people with diabetes? Where to find more information American Diabetes Association: www.diabetes.org Association of Diabetes Care and Education  Specialists: www.diabeteseducator.org Contact a health care provider if: Your blood glucose is at or above 240 mg/dL (13.3 mmol/L) for 2 days in a row. You have been sick or have had a fever for 2 days or more, and you are not getting better. You have any of these problems for more than 6 hours: You cannot eat or drink. You feel nauseous. You vomit. You have diarrhea. Get help right away if: Your blood glucose is lower than 54 mg/dL (3 mmol/L). You get confused. You have trouble thinking clearly. You have trouble breathing. These symptoms may represent a serious problem that is an emergency. Do not wait to see if the symptoms will go away. Get medical help right away. Call your local emergency services (911 in the [place] Do not drive yourself to the hospital. Summary Diabetes mellitus is a chronic disease that occurs when the body does not properly use sugar (glucose) that is released from food after you eat. Take insulin and diabetes medicines as told. Check your blood glucose every day, as often as told. Keep all follow-up visits. This is important. This information is not intended to replace advice given to you by your health care provider. Make sure you discuss any questions you have with your health care provider. Document Revised: 07/08/2019 Document Reviewed: 07/08/2019 Elsevier Patient Education  2023 Elsevier Inc.  

## 2022-02-20 ENCOUNTER — Encounter: Payer: Self-pay | Admitting: Nurse Practitioner

## 2022-02-20 ENCOUNTER — Ambulatory Visit (INDEPENDENT_AMBULATORY_CARE_PROVIDER_SITE_OTHER): Payer: Medicaid Other | Admitting: Nurse Practitioner

## 2022-02-20 VITALS — BP 111/75 | HR 75 | Temp 97.7°F | Ht 62.09 in | Wt 202.6 lb

## 2022-02-20 DIAGNOSIS — E113293 Type 2 diabetes mellitus with mild nonproliferative diabetic retinopathy without macular edema, bilateral: Secondary | ICD-10-CM | POA: Diagnosis not present

## 2022-02-20 DIAGNOSIS — E785 Hyperlipidemia, unspecified: Secondary | ICD-10-CM

## 2022-02-20 DIAGNOSIS — I152 Hypertension secondary to endocrine disorders: Secondary | ICD-10-CM

## 2022-02-20 DIAGNOSIS — E1169 Type 2 diabetes mellitus with other specified complication: Secondary | ICD-10-CM

## 2022-02-20 DIAGNOSIS — R809 Proteinuria, unspecified: Secondary | ICD-10-CM

## 2022-02-20 DIAGNOSIS — R011 Cardiac murmur, unspecified: Secondary | ICD-10-CM

## 2022-02-20 DIAGNOSIS — E559 Vitamin D deficiency, unspecified: Secondary | ICD-10-CM

## 2022-02-20 DIAGNOSIS — S91302A Unspecified open wound, left foot, initial encounter: Secondary | ICD-10-CM | POA: Insufficient documentation

## 2022-02-20 DIAGNOSIS — E1129 Type 2 diabetes mellitus with other diabetic kidney complication: Secondary | ICD-10-CM

## 2022-02-20 DIAGNOSIS — G63 Polyneuropathy in diseases classified elsewhere: Secondary | ICD-10-CM

## 2022-02-20 DIAGNOSIS — E1159 Type 2 diabetes mellitus with other circulatory complications: Secondary | ICD-10-CM

## 2022-02-20 DIAGNOSIS — E538 Deficiency of other specified B group vitamins: Secondary | ICD-10-CM

## 2022-02-20 DIAGNOSIS — S30810A Abrasion of lower back and pelvis, initial encounter: Secondary | ICD-10-CM

## 2022-02-20 DIAGNOSIS — E1142 Type 2 diabetes mellitus with diabetic polyneuropathy: Secondary | ICD-10-CM

## 2022-02-20 DIAGNOSIS — F419 Anxiety disorder, unspecified: Secondary | ICD-10-CM

## 2022-02-20 DIAGNOSIS — K219 Gastro-esophageal reflux disease without esophagitis: Secondary | ICD-10-CM

## 2022-02-20 DIAGNOSIS — Z794 Long term (current) use of insulin: Secondary | ICD-10-CM

## 2022-02-20 DIAGNOSIS — G4733 Obstructive sleep apnea (adult) (pediatric): Secondary | ICD-10-CM

## 2022-02-20 DIAGNOSIS — Z1231 Encounter for screening mammogram for malignant neoplasm of breast: Secondary | ICD-10-CM

## 2022-02-20 LAB — BAYER DCA HB A1C WAIVED: HB A1C (BAYER DCA - WAIVED): 7.3 % — ABNORMAL HIGH (ref 4.8–5.6)

## 2022-02-20 MED ORDER — GENTAMICIN SULFATE 0.1 % EX OINT: 1.0000 | TOPICAL_OINTMENT | Freq: Three times a day (TID) | CUTANEOUS | 0 refills | Status: AC

## 2022-02-20 MED ORDER — FIFTY50 GLUCOSE METER 2.0 W/DEVICE KIT: PACK | 2 refills | Status: DC

## 2022-02-20 NOTE — Assessment & Plan Note (Signed)
Ongoing.  Continue OTC supplement which is offering benefit to her neuropathy.  Recheck level next visit.

## 2022-02-20 NOTE — Assessment & Plan Note (Signed)
After stepping on sharp edge of dog cage, is up to date on tetanus.  Overall wound has no significant s/s infection and appears to be healing.  Will order Gentamicin ointment to place on wound to further promote healing, due to diabetes will monitor closely.  Have return in 2 weeks for wound check.

## 2022-02-20 NOTE — Assessment & Plan Note (Signed)
Chronic, ongoing, taking weekly supplement.  Continue this supplement and adjust as needed. Vit D level next visit

## 2022-02-20 NOTE — Assessment & Plan Note (Addendum)
BMI 36.95, has lost total of 30 pounds with Trulicity on board (may change to [facility] next visit if A1c remains >7%, this would offer further weight loss benefit), with T2DM, HLD, HTN.  Recommended eating smaller high protein, low fat meals more frequently and exercising 30 mins a day 5 times a week with a goal of 10-15lb weight loss in the next 3 months. Patient voiced their understanding and motivation to adhere to these recommendations.

## 2022-02-20 NOTE — Assessment & Plan Note (Signed)
Chronic, ongoing.  Continue current medication regimen and adjust as needed. Lipid panel today. 

## 2022-02-20 NOTE — Assessment & Plan Note (Signed)
Chronic, ongoing. Was followed by [facility], but PCP currently taking over prescriptions.  Will return to them if any worsening mood.  Denies SI/HI.

## 2022-02-20 NOTE — Assessment & Plan Note (Signed)
Chronic, stable.  BP at goal in office.  Currently on Lisinopril for kidney protection and BP, may benefit from switch to ARB in future due to her underlying asthma.  Recommend she monitor BP at least a few mornings a week at home and document.  DASH diet at home.  Labs today: BMP.  Return in 3 months.

## 2022-02-20 NOTE — Assessment & Plan Note (Addendum)
Chronic, ongoing with improved A1c today 7.3%, previous 7.8%.  Praised for this. - Continue Trulicity 4.5 MG weekly and continue Jardiance daily with goal in future to reduce to discontinue insulin, discussed with patient.  At this time continue Glipizide 10 MG BID and Lantus 30 units, discussed with her that with medication changes if lows present she is to reduce this by 3 units every 3 days if needed = if BS in morning <130 consistently.   - Consider transition to [facility] next visit if still not <0% on max dose Trulicity.  Discussed with patient and she is interested in this change if needed. - Check BS TID and document for visits.   - Return in 3 months.

## 2022-02-20 NOTE — Assessment & Plan Note (Signed)
Refer to diabetes with neuropathy plan of care and continue Lisinopril for kidney protection.

## 2022-02-20 NOTE — Assessment & Plan Note (Addendum)
Chronic, ongoing.  Ongoing issues around 7-8 pm with choking feeling.  Continue Prilosec 20 MG BID and recommended taking evening dose a bit earlier, around 5 pm.  Focus on diet changes.  Will place referral to get back into GI for assessment.  Mag level today.

## 2022-02-20 NOTE — Assessment & Plan Note (Signed)
Reports she was told she did not need CPAP, but to focus on weight loss -- may benefit repeat study in future.

## 2022-02-20 NOTE — Progress Notes (Signed)
BP 111/75   Pulse 75   Temp 97.7 F (36.5 C) (Oral)   Ht 5' 2.09" (1.577 m)   Wt 202 lb 9.6 oz (91.9 kg)   SpO2 96%   BMI 36.95 kg/m    Subjective:    Patient ID: Michelle Warner, female    DOB: January 07, 1976, 46 y.o.   MRN: [id number]  HPI: URSULA DERMODY is a 46 y.o. female  Chief Complaint  Patient presents with   Diabetes   Hypertension   Hyperlipidemia   Gastroesophageal Reflux   Sleep Apnea   Mood   Bite    Has what appears to be a bite on her right butt cheek, noticed it last week     Foot Pain    Left foot pain from last week. Patient states she cut her foot on an old cage in her room   DIABETES September A1c 2.3% -- taking Trulicity 4.5 MG weekly, Jardiance 25 MG daily + Glipizide 10 MG BID and Lantus 30 units at night -- goal is to reduce insulin and discontinue if possible in long run. Continues on Gabapentin for neuropathy pain + Vitamin B12 supplements.  Continues Vitamin D supplement weekly for history of low levels.  Has lost total of 30 pounds over past year -- Trulicity works well for her for sugars and weight.  Took Metformin in past but this caused major GI issues. Hypoglycemic episodes:no Polydipsia/polyuria: no Visual disturbance: no Chest pain: no Paresthesias: no Glucose Monitoring: yes  Accucheck frequency: Daily  Fasting glucose: 89 to 200  Post prandial:  Evening:  Before meals: Taking Insulin?: yes  Long acting insulin: 30 units Lantus  Short acting insulin: Blood Pressure Monitoring: not checking Retinal Examination: Up to Date -- [place], injections often Foot Exam: Up to Date Pneumovax: Refuses Influenza: Refuses Aspirin: no   HYPERTENSION / HYPERLIPIDEMIA Taking Lisinopril 20 MG daily and Crestor 10 MG daily.  History of sleep study with recommendation for weight loss. Satisfied with current treatment? yes Duration of hypertension: chronic BP monitoring frequency: rarely BP range:  BP medication side effects: no Duration  of hyperlipidemia: chronic Cholesterol medication side effects: no Cholesterol supplements: none Medication compliance: good compliance Aspirin: no Recent stressors: no Recurrent headaches: no Visual changes: no Palpitations: no Dyspnea: no Chest pain: no Lower extremity edema: no Dizzy/lightheaded: no   GERD Takes Prilosec 20 MG BID -- does notice every night at 7-8 pm gets choked up feeling.  Saw GI last 11/24/20 -- did not have recommended colonoscopy and EGD. GERD control status: stable Satisfied with current treatment? yes Heartburn frequency: intermittent Medication side effects: no  Medication compliance: stable Dysphagia: no Odynophagia:  no Hematemesis: no Blood in stool: no EGD: no   BITE AND LEFT FOOT PAIN Reports bug bite to right buttock, has been present for about one week.  Had scab on it, but this came off and a little blood remains.  Left foot pain started after she cut foot on old dog cage, happened last week.  She reports area is really sore -- peroxide and neosporin on it at home. Duration: days Location: left buttock History of trauma in area: no Pain: no Quality: no Severity: mild Redness: yes Swelling: no Oozing: no Pus: no Fevers: no Nausea/vomiting: no Status: stable Treatments attempted: Peroxide and Neosporin Tetanus: UTD last 08/18/2016  DEPRESSION Continues on Celexa and Trazodone.  Saw [facility] in past and remains stable on medication.   Mood status: stable Satisfied with current treatment?:  yes Symptom severity: moderate  Duration of current treatment : chronic Side effects: no Medication compliance: good compliance Psychotherapy/counseling: none Depressed mood: no Anxious mood: occasionally Anhedonia: no Significant weight loss or gain: no Insomnia: yes hard to fall asleep Fatigue: yes Feelings of worthlessness or guilt: no Impaired concentration/indecisiveness: no Suicidal ideations: no Hopelessness: no Crying spells: no     02/20/2022    2:31 PM 11/18/2021    4:08 PM 08/12/2021    9:09 AM 05/05/2021    9:55 AM 01/15/2018   12:50 PM  Depression screen PHQ 2/9  Decreased Interest 0 0 0 0 0  Down, Depressed, Hopeless 0 0 0 0 2  PHQ - 2 Score 0 0 0 0 2  Altered sleeping '1 2 3 1 1  '$ Tired, decreased energy '3 1 3 1 1  '$ Change in appetite 0 0 0 0 1  Feeling bad or failure about yourself  0 0 0 0 0  Trouble concentrating 0 1 0 0 1  Moving slowly or fidgety/restless 0 0 0 0 3  Suicidal thoughts 0 0 0 0 0  PHQ-9 Score '4 4 6 2 9  '$ Difficult doing work/chores Not difficult at all Not difficult at all   Somewhat difficult       02/20/2022    2:31 PM 11/18/2021    4:08 PM 08/12/2021    9:10 AM 05/05/2021    9:55 AM  GAD 7 : Generalized Anxiety Score  Nervous, Anxious, on Edge '1 2 1 1  '$ Control/stop worrying 2 1 0 0  Worry too much - different things 1 3 0 1  Trouble relaxing 0 1 2 0  Restless 0 0 0 0  Easily annoyed or irritable '1 3 2 '$ 0  Afraid - awful might happen 0 0 0 0  Total GAD 7 Score '5 10 5 2  '$ Anxiety Difficulty Not difficult at all Not difficult at all Not difficult at all Not difficult at all   Relevant past medical, surgical, family and social history reviewed and updated as indicated. Interim medical history since our last visit reviewed. Allergies and medications reviewed and updated.  Review of Systems  Constitutional:  Negative for activity change, appetite change, diaphoresis, fatigue and fever.  Respiratory:  Negative for cough, chest tightness, shortness of breath and wheezing.   Cardiovascular:  Negative for chest pain, palpitations and leg swelling.  Gastrointestinal: Negative.   Endocrine: Negative for cold intolerance, heat intolerance, polydipsia, polyphagia and polyuria.  Skin:  Positive for wound.  Neurological: Negative.   Psychiatric/Behavioral: Negative.     Per HPI unless specifically indicated above     Objective:    BP 111/75   Pulse 75   Temp 97.7 F (36.5 C) (Oral)   Ht 5'  2.09" (1.577 m)   Wt 202 lb 9.6 oz (91.9 kg)   SpO2 96%   BMI 36.95 kg/m   Wt Readings from Last 3 Encounters:  02/20/22 202 lb 9.6 oz (91.9 kg)  01/18/22 202 lb (91.6 kg)  01/12/22 208 lb (94.3 kg)    Physical Exam Vitals and nursing note reviewed.  Constitutional:      General: She is awake. She is not in acute distress.    Appearance: She is well-developed and well-groomed. She is obese. She is not ill-appearing or toxic-appearing.  HENT:     Head: Normocephalic.     Right Ear: Hearing normal.     Left Ear: Hearing normal.  Eyes:     General: Lids  are normal.        Right eye: No discharge.        Left eye: No discharge.     Pupils: Pupils are equal, round, and reactive to light.     Funduscopic exam:    Right eye: No hemorrhage.        Left eye: No hemorrhage.  Neck:     Thyroid: No thyromegaly.     Vascular: No carotid bruit.  Cardiovascular:     Rate and Rhythm: Normal rate and regular rhythm.     Heart sounds: Murmur heard.     Systolic murmur is present with a grade of 3/6.     No gallop.  Pulmonary:     Effort: Pulmonary effort is normal. No accessory muscle usage or respiratory distress.     Breath sounds: Normal breath sounds.  Abdominal:     General: Bowel sounds are normal.     Palpations: Abdomen is soft.  Musculoskeletal:     Cervical back: Normal range of motion and neck supple.     Right lower leg: No edema.     Left lower leg: No edema.  Lymphadenopathy:     Head:     Right side of head: No submental, submandibular, tonsillar, preauricular or posterior auricular adenopathy.     Left side of head: No submental, submandibular, tonsillar, preauricular or posterior auricular adenopathy.     Cervical: No cervical adenopathy.  Skin:    General: Skin is warm and dry.       Neurological:     Mental Status: She is alert and oriented to person, place, and time.  Psychiatric:        Attention and Perception: Attention normal.        Mood and Affect:  Mood normal.        Speech: Speech normal.        Behavior: Behavior normal. Behavior is cooperative.        Thought Content: Thought content normal.    Results for orders placed or performed in visit on 02/20/22  Bayer DCA Hb A1c Waived  Result Value Ref Range   HB A1C (BAYER DCA - WAIVED) 7.3 (H) 4.8 - 5.6 %      Assessment & Plan:   Problem List Items Addressed This Visit       Cardiovascular and Mediastinum   Hypertension associated with diabetes (HCC)    Chronic, stable.  BP at goal in office.  Currently on Lisinopril for kidney protection and BP, may benefit from switch to ARB in future due to her underlying asthma.  Recommend she monitor BP at least a few mornings a week at home and document.  DASH diet at home.  Labs today: BMP.  Return in 3 months.       Relevant Orders   Bayer DCA Hb A1c Waived (Completed)   Basic metabolic panel     Respiratory   Obstructive sleep apnea of adult    Reports she was told she did not need CPAP, but to focus on weight loss -- may benefit repeat study in future.        Digestive   Gastro-esophageal reflux disease without esophagitis    Chronic, ongoing.  Ongoing issues around 7-8 pm with choking feeling.  Continue Prilosec 20 MG BID and recommended taking evening dose a bit earlier, around 5 pm.  Focus on diet changes.  Will place referral to get back into GI for assessment.  Mag level today.  Relevant Orders   Magnesium   Ambulatory referral to Gastroenterology     Endocrine   Type 2 diabetes mellitus with diabetic polyneuropathy, with long-term current use of insulin (HCC)    Chronic, ongoing with improved A1c today 7.3%, previous 7.8%.  Continue Gabapentin and B12 for neuropathy.   - Continue Trulicity 4.5 MG weekly and continue Jardiance daily with goal in future to reduce to discontinue insulin, discussed with patient.  At this time continue Glipizide 10 MG BID and Lantus 30 units, discussed with her that with medication  changes if lows present she is to reduce this by 3 units every 3 days if needed = if BS in morning <130 consistently.   - Consider transition to [facility] next visit if still not <4% on max dose Trulicity.  Discussed with patient and she is interested in this change if needed. - Check BS TID and document for visits.   - Return in 3 months.      Relevant Orders   Bayer DCA Hb A1c Waived (Completed)   Hyperlipidemia associated with type 2 diabetes mellitus (HCC)    Chronic, ongoing.  Continue current medication regimen and adjust as needed.  Lipid panel today.      Relevant Orders   Bayer DCA Hb A1c Waived (Completed)   Lipid Panel w/o Chol/HDL Ratio   Type 2 diabetes mellitus with both eyes affected by mild nonproliferative retinopathy without macular edema, with long-term current use of insulin (HCC)    Chronic, ongoing with improved A1c today 7.3%, previous 7.8%.  Praised for this. - Continue Trulicity 4.5 MG weekly and continue Jardiance daily with goal in future to reduce to discontinue insulin, discussed with patient.  At this time continue Glipizide 10 MG BID and Lantus 30 units, discussed with her that with medication changes if lows present she is to reduce this by 3 units every 3 days if needed = if BS in morning <130 consistently.   - Consider transition to [facility] next visit if still not <1% on max dose Trulicity.  Discussed with patient and she is interested in this change if needed. - Check BS TID and document for visits.   - Return in 3 months.      Relevant Orders   Bayer DCA Hb A1c Waived (Completed)   Type 2 diabetes mellitus with proteinuria ([place]) - Primary    Refer to diabetes with neuropathy plan of care and continue Lisinopril for kidney protection.      Relevant Orders   Bayer DCA Hb A1c Waived (Completed)     Nervous and Auditory   Vitamin B12 deficiency neuropathy ([place])    Ongoing.  Continue OTC supplement which is offering benefit to her neuropathy.  Recheck  level next visit.        Other   Anxiety    Chronic, ongoing. Was followed by [facility], but PCP currently taking over prescriptions.  Will return to them if any worsening mood.  Denies SI/HI.      Heart murmur, systolic    Ongoing with history of work-up in past.  More prominent on exam today, will order repeat echo to check on this and send to cardiology as needed.  She denies any symptoms.      Relevant Orders   ECHOCARDIOGRAM COMPLETE   Morbid obesity ([place])    BMI 36.95, has lost total of 30 pounds with Trulicity on board (may change to [facility] next visit if A1c remains >7%, this would offer further weight loss  benefit), with T2DM, HLD, HTN.  Recommended eating smaller high protein, low fat meals more frequently and exercising 30 mins a day 5 times a week with a goal of 10-15lb weight loss in the next 3 months. Patient voiced their understanding and motivation to adhere to these recommendations.       Open wound of left heel    After stepping on sharp edge of dog cage, is up to date on tetanus.  Overall wound has no significant s/s infection and appears to be healing.  Will order Gentamicin ointment to place on wound to further promote healing, due to diabetes will monitor closely.  Have return in 2 weeks for wound check.      Vitamin D deficiency    Chronic, ongoing, taking weekly supplement.  Continue this supplement and adjust as needed. Vit D level next visit      Other Visit Diagnoses     Abrasion of right buttock, initial encounter       From reported bug bite, overall area is healing with crusting present.  No s/s infection.   Encounter for screening mammogram for malignant neoplasm of breast       Mammogram ordered today   Relevant Orders   MM 3D SCREEN BREAST BILATERAL        Follow up plan: Return in about 2 weeks (around 03/06/2022) for Heel Wound check (left).

## 2022-02-20 NOTE — Assessment & Plan Note (Signed)
Chronic, ongoing with improved A1c today 7.3%, previous 7.8%.  Continue Gabapentin and B12 for neuropathy.   - Continue Trulicity 4.5 MG weekly and continue Jardiance daily with goal in future to reduce to discontinue insulin, discussed with patient.  At this time continue Glipizide 10 MG BID and Lantus 30 units, discussed with her that with medication changes if lows present she is to reduce this by 3 units every 3 days if needed = if BS in morning <130 consistently.   - Consider transition to [facility] next visit if still not <8% on max dose Trulicity.  Discussed with patient and she is interested in this change if needed. - Check BS TID and document for visits.   - Return in 3 months.

## 2022-02-20 NOTE — Assessment & Plan Note (Addendum)
Ongoing with history of work-up in past.  More prominent on exam today, will order repeat echo to check on this and send to cardiology as needed.  She denies any symptoms.

## 2022-02-21 LAB — BASIC METABOLIC PANEL
BUN/Creatinine Ratio: 18 (ref 9–23)
BUN: 11 mg/dL (ref 6–24)
CO2: 24 mmol/L (ref 20–29)
Calcium: 9.3 mg/dL (ref 8.7–10.2)
Chloride: 103 mmol/L (ref 96–106)
Creatinine, Ser: 0.61 mg/dL (ref 0.57–1.00)
Glucose: 75 mg/dL (ref 70–99)
Potassium: 4.2 mmol/L (ref 3.5–5.2)
Sodium: 142 mmol/L (ref 134–144)
eGFR: 113 mL/min/{1.73_m2} (ref >59)

## 2022-02-21 LAB — LIPID PANEL W/O CHOL/HDL RATIO
Cholesterol, Total: 128 mg/dL (ref 100–199)
HDL: 43 mg/dL (ref >39)
LDL Chol Calc (NIH): 61 mg/dL (ref 0–99)
Triglycerides: 135 mg/dL (ref 0–149)
VLDL Cholesterol Cal: 24 mg/dL (ref 5–40)

## 2022-02-21 LAB — MAGNESIUM: Magnesium: 1.9 mg/dL (ref 1.6–2.3)

## 2022-02-21 NOTE — Progress Notes (Signed)
Contacted via [place] morning Lady, as always great to see you and I am proud of you!!  Your labs on this check remain amazing!!  [place] job!!  Continue all medications currently ordered and ensure to schedule heart check (echocardiogram ordered) + schedule with stomach doctor to check on heart burn issues at night.  Any questions? Keep being amazing!!  Thank you for allowing me to participate in your care.  I appreciate you. Kindest regards, Brigitte Soderberg

## 2022-03-02 ENCOUNTER — Ambulatory Visit: Payer: Medicaid Other | Admitting: Licensed Practical Nurse

## 2022-03-05 NOTE — Patient Instructions (Incomplete)

## 2022-03-06 ENCOUNTER — Ambulatory Visit: Payer: Medicaid Other | Admitting: Nurse Practitioner

## 2022-03-22 ENCOUNTER — Ambulatory Visit: Payer: Medicaid Other | Admitting: Licensed Practical Nurse

## 2022-03-23 ENCOUNTER — Telehealth: Payer: Medicaid Other | Admitting: Gastroenterology

## 2022-04-06 ENCOUNTER — Ambulatory Visit: Payer: Medicaid Other | Admitting: Licensed Practical Nurse

## 2022-04-20 ENCOUNTER — Ambulatory Visit: Payer: Medicaid Other | Admitting: Licensed Practical Nurse

## 2022-04-28 ENCOUNTER — Ambulatory Visit: Payer: Medicaid Other | Admitting: Licensed Practical Nurse

## 2022-05-08 ENCOUNTER — Ambulatory Visit: Payer: Medicaid Other | Admitting: Licensed Practical Nurse

## 2022-05-08 ENCOUNTER — Telehealth: Payer: Self-pay | Admitting: Nurse Practitioner

## 2022-05-08 NOTE — Telephone Encounter (Signed)
Pt called saying the pharmacy told her the Trulicity was on back..  She is needing a refil because she is completely out.  Please advise  [phone]

## 2022-05-09 MED ORDER — SEMAGLUTIDE (2 MG/DOSE) 8 MG/3ML ~~LOC~~ SOPN: 2.0000 mg | PEN_INJECTOR | SUBCUTANEOUS | 2 refills | Status: DC

## 2022-05-12 ENCOUNTER — Telehealth: Payer: Self-pay

## 2022-05-12 ENCOUNTER — Other Ambulatory Visit: Payer: Self-pay | Admitting: Nurse Practitioner

## 2022-05-12 MED ORDER — INSULIN GLARGINE-YFGN 100 UNIT/ML ~~LOC~~ SOPN: 30.0000 [IU] | PEN_INJECTOR | Freq: Every day | SUBCUTANEOUS | 4 refills | Status: DC

## 2022-05-12 NOTE — Telephone Encounter (Signed)
PA started for [facility] pen thorough cover my meds

## 2022-05-15 ENCOUNTER — Telehealth: Payer: Self-pay

## 2022-05-15 NOTE — Telephone Encounter (Signed)
Paperwork faxed back to [employer]

## 2022-05-16 ENCOUNTER — Other Ambulatory Visit: Payer: Self-pay

## 2022-05-22 ENCOUNTER — Ambulatory Visit: Payer: Medicaid Other | Admitting: Nurse Practitioner

## 2022-05-22 ENCOUNTER — Other Ambulatory Visit: Payer: Self-pay | Admitting: Nurse Practitioner

## 2022-05-22 DIAGNOSIS — J4530 Mild persistent asthma, uncomplicated: Secondary | ICD-10-CM

## 2022-05-22 DIAGNOSIS — I152 Hypertension secondary to endocrine disorders: Secondary | ICD-10-CM

## 2022-05-22 DIAGNOSIS — R011 Cardiac murmur, unspecified: Secondary | ICD-10-CM

## 2022-05-22 DIAGNOSIS — E1169 Type 2 diabetes mellitus with other specified complication: Secondary | ICD-10-CM

## 2022-05-22 DIAGNOSIS — E559 Vitamin D deficiency, unspecified: Secondary | ICD-10-CM

## 2022-05-22 DIAGNOSIS — E1142 Type 2 diabetes mellitus with diabetic polyneuropathy: Secondary | ICD-10-CM

## 2022-05-22 DIAGNOSIS — E1129 Type 2 diabetes mellitus with other diabetic kidney complication: Secondary | ICD-10-CM

## 2022-05-22 DIAGNOSIS — E538 Deficiency of other specified B group vitamins: Secondary | ICD-10-CM

## 2022-05-22 DIAGNOSIS — F419 Anxiety disorder, unspecified: Secondary | ICD-10-CM

## 2022-05-22 DIAGNOSIS — G4733 Obstructive sleep apnea (adult) (pediatric): Secondary | ICD-10-CM

## 2022-05-22 NOTE — Telephone Encounter (Signed)
Medication Refill - Medication: traZODone (DESYREL) 100 MG tablet  Vitamin D, Ergocalciferol, (DRISDOL) 1.25 MG (50000 UNIT) CAPS capsule   Has the patient contacted their pharmacy? Yes.   (Agent: If no, request that the patient contact the pharmacy for the refill. If patient does not wish to contact the pharmacy document the reason why and proceed with request.) (Agent: If yes, when and what did the pharmacy advise?)  Preferred Pharmacy (with phone number or street name):  [facility] [place] - [address]  [place] ([place]) [place] [address]  Phone: [phone] Fax: [phone]   Has the patient been seen for an appointment in the last year OR does the patient have an upcoming appointment? Yes.    Agent: Please be advised that RX refills may take up to 3 business days. We ask that you follow-up with your pharmacy.

## 2022-05-23 ENCOUNTER — Telehealth: Payer: Self-pay

## 2022-05-23 ENCOUNTER — Telehealth: Payer: Self-pay | Admitting: Nurse Practitioner

## 2022-05-23 MED ORDER — VITAMIN D (ERGOCALCIFEROL) 1.25 MG (50000 UNIT) PO CAPS: 50000.0000 [IU] | ORAL_CAPSULE | ORAL | 5 refills | Status: DC

## 2022-05-23 MED ORDER — DAPAGLIFLOZIN PROPANEDIOL 10 MG PO TABS: 10.0000 mg | ORAL_TABLET | Freq: Every day | ORAL | 4 refills | Status: DC

## 2022-05-23 NOTE — Telephone Encounter (Signed)
Requested medication (s) are due for refill today: yes  Requested medication (s) are on the active medication list: yes    Last refill: 08/12/21   #5  5 refills  Future visit scheduled yes 06/19/22  Notes to clinic:Not delegated, please review. Thank you.  Requested Prescriptions  Pending Prescriptions Disp Refills   Vitamin D, Ergocalciferol, (DRISDOL) 1.25 MG (50000 UNIT) CAPS capsule 5 capsule 5    Sig: Take 1 capsule (50,000 Units total) by mouth every 7 (seven) days.     Endocrinology:  Vitamins - Vitamin D Supplementation 2 Failed - 05/22/2022  1:30 PM      Failed - Manual Review: Route requests for 50,000 IU strength to the provider      Passed - Ca in normal range and within 360 days    Calcium  Date Value Ref Range Status  02/20/2022 9.3 8.7 - 10.2 mg/dL Final   Calcium, Total  Date Value Ref Range Status  08/24/2011 8.9 8.5 - 10.1 mg/dL Final         Passed - Vitamin D in normal range and within 360 days    Vit D, 25-Hydroxy  Date Value Ref Range Status  11/18/2021 36.0 30.0 - 100.0 ng/mL Final    Comment:    Vitamin D deficiency has been defined by the [place] practice guideline as a level of serum 25-OH vitamin D less than 20 ng/mL (1,2). The Endocrine Society went on to further define vitamin D insufficiency as a level between 21 and 29 ng/mL (2). 1. IOM (Institute of Medicine). 2010. Dietary reference    intakes for calcium and D. Atkins: The    [employer]. 2. Holick MF, Binkley [place], Bischoff-Ferrari HA, et al.    Evaluation, treatment, and prevention of vitamin D    deficiency: an Endocrine Society clinical practice    guideline. JCEM. 2011 Jul; 96(7):1911-30.          Passed - Valid encounter within last 12 months    Recent Outpatient Visits           3 months ago Type 2 diabetes mellitus with proteinuria ([place])   [place], [place] T, NP   6 months ago Type 2  diabetes mellitus with proteinuria ([place])   [place], Prosper T, NP   9 months ago Type 2 diabetes mellitus with proteinuria ([place])   Center Point Lane, Altona T, NP   11 months ago Type 2 diabetes mellitus with diabetic polyneuropathy, with long-term current use of insulin ([place])   [place], [place] T, NP   1 year ago Type 2 diabetes mellitus with diabetic polyneuropathy, with long-term current use of insulin ([place])   [place], Barbaraann Faster, NP       Future Appointments             In 3 weeks Jonathon Bellows, MD [facility] [facility] at [place]   In 3 weeks [place], Henrine Screws T, NP [place], [facility]            Refused Prescriptions Disp Refills   traZODone (DESYREL) 100 MG tablet 90 tablet 4    Sig: Take 1 tablet by mouth at bedtime. 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[place])   [place], Winthrop T, NP   6 months ago Type 2 diabetes mellitus with proteinuria ([place])   [place], Duncan T, NP   9 months ago Type 2 diabetes mellitus with proteinuria ([place])   [place] Nelson, Sadorus T, NP   11 months ago Type 2 diabetes mellitus with diabetic polyneuropathy, with long-term current use of insulin ([place])   [place], Mingo Junction T, NP   1 year ago Type 2 diabetes mellitus with diabetic polyneuropathy, with long-term current use of insulin ([place])   [place], Barbaraann Faster, NP       Future  Appointments             In 3 weeks Jonathon Bellows, MD [place] [facility] at [place]   In 3 weeks Venita Lick, NP [place], [facility]

## 2022-05-23 NOTE — Telephone Encounter (Signed)
Paperwork faxed back to [employer]

## 2022-05-23 NOTE — Telephone Encounter (Signed)
Left vm for patient to return call about medication change

## 2022-05-23 NOTE — Telephone Encounter (Signed)
Patient returned call and I informed her that Michelle Warner changed her medication to [place] from [place] because her insurance will cover the [place]  since they are not covering [place]. Patient verbalized understanding.

## 2022-05-23 NOTE — Telephone Encounter (Signed)
Requested Prescriptions  Pending Prescriptions Disp Refills   traZODone (DESYREL) 100 MG tablet 90 tablet 4    Sig: Take 1 tablet by mouth at bedtime. 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[place])   [place] Wyatt, Pea Ridge T, NP   6 months ago Type 2 diabetes mellitus with proteinuria ([place])   [place], Riverdale T, NP   9 months ago Type 2 diabetes mellitus with proteinuria ([place])   [place], Stanaford T, NP   11 months ago Type 2 diabetes mellitus with diabetic polyneuropathy, with long-term current use of insulin ([place])   [place]ary, Mount Vernon T, NP   1 year ago Type 2 diabetes mellitus with diabetic polyneuropathy, with long-term current use of insulin ([place])   [place], Barbaraann Faster, NP       Future Appointments             In 3 weeks Jonathon Bellows, MD [facility] [facility] [place]   In 3 weeks [place], Barbaraann Faster, NP [place], [facility]             Vitamin D, Ergocalciferol, (DRISDOL) 1.25 MG (50000 UNIT) CAPS capsule 5 capsule 5    Sig: Take 1 capsule (50,000 Units total) by mouth every 7 (seven) days.     Endocrinology:  Vitamins - Vitamin D Supplementation 2 Failed - 05/22/2022  1:30 PM      Failed - Manual Review: Route requests for 50,000 IU strength to the provider      Passed - Ca in normal range and within 360 days    Calcium  Date Value Ref Range Status  02/20/2022 9.3 8.7 - 10.2 mg/dL Final   Calcium, Total  Date Value Ref Range Status  08/24/2011 8.9 8.5 - 10.1 mg/dL Final         Passed -  Vitamin D in normal range and within 360 days    Vit D, 25-Hydroxy  Date Value Ref Range Status  11/18/2021 36.0 30.0 - 100.0 ng/mL Final    Comment:    Vitamin D deficiency has been defined by the [place] practice guideline as a level of serum 25-OH vitamin D less than 20 ng/mL (1,2). The Endocrine Society went on to further define vitamin D insufficiency as a level between 21 and 29 ng/mL (2). 1. IOM (Institute of Medicine). 2010. Dietary reference    intakes for calcium and D. [place]: The    [employer]. 2. Holick MF, Binkley [place], Bischoff-Ferrari HA, et al.    Evaluation, treatment, and prevention of vitamin D    deficiency: an Endocrine Society clinical practice    guideline. JCEM. 2011 Jul; 96(7):1911-30.          Passed - Valid encounter within last 12 months    Recent Outpatient Visits           3 months ago Type 2 diabetes mellitus with proteinuria ([place])   [place], Barbaraann Faster, NP  6 months ago Type 2 diabetes mellitus with proteinuria ([place])   [place] Martorell, Vincent T, NP   9 months ago Type 2 diabetes mellitus with proteinuria ([place])   [place], Lake City T, NP   11 months ago Type 2 diabetes mellitus with diabetic polyneuropathy, with long-term current use of insulin ([place])   [place], Larke T, NP   1 year ago Type 2 diabetes mellitus with diabetic polyneuropathy, with long-term current use of insulin ([place])   [place], Barbaraann Faster, NP       Future Appointments             In 3 weeks Jonathon Bellows, MD [place] [facility]   In 3 weeks Venita Lick, NP [place], [facility]

## 2022-05-23 NOTE — Telephone Encounter (Signed)
Copied from [place] [id number]. Topic: General - Other >> May 23, 2022  1:03 PM Ja-Kwan M wrote: Reason for CRM: Pt stated she is completely out of the empagliflozin (JARDIANCE) 25 MG TABS tablet and she was told that a prior authorization is needed

## 2022-05-24 ENCOUNTER — Ambulatory Visit: Payer: Medicaid Other | Admitting: Licensed Practical Nurse

## 2022-05-24 NOTE — Telephone Encounter (Signed)
Patient called and stated that her insurance is requesting a pa for the [place]. Please advise.

## 2022-05-26 NOTE — Telephone Encounter (Signed)
PA has been approved for Farxiga '10mg'$  from 05/26/22 through 05/21/23.   ID [id number]  PA# [id number]

## 2022-06-13 ENCOUNTER — Ambulatory Visit: Payer: Medicaid Other | Admitting: Gastroenterology

## 2022-06-19 ENCOUNTER — Ambulatory Visit: Payer: Medicaid Other | Admitting: Nurse Practitioner

## 2022-06-19 ENCOUNTER — Other Ambulatory Visit: Payer: Self-pay | Admitting: Nurse Practitioner

## 2022-06-19 DIAGNOSIS — Z794 Long term (current) use of insulin: Secondary | ICD-10-CM

## 2022-06-19 DIAGNOSIS — J4530 Mild persistent asthma, uncomplicated: Secondary | ICD-10-CM

## 2022-06-19 DIAGNOSIS — R011 Cardiac murmur, unspecified: Secondary | ICD-10-CM

## 2022-06-19 DIAGNOSIS — K219 Gastro-esophageal reflux disease without esophagitis: Secondary | ICD-10-CM

## 2022-06-19 DIAGNOSIS — E1159 Type 2 diabetes mellitus with other circulatory complications: Secondary | ICD-10-CM

## 2022-06-19 DIAGNOSIS — E559 Vitamin D deficiency, unspecified: Secondary | ICD-10-CM

## 2022-06-19 DIAGNOSIS — E1142 Type 2 diabetes mellitus with diabetic polyneuropathy: Secondary | ICD-10-CM

## 2022-06-19 DIAGNOSIS — F419 Anxiety disorder, unspecified: Secondary | ICD-10-CM

## 2022-06-19 DIAGNOSIS — E1169 Type 2 diabetes mellitus with other specified complication: Secondary | ICD-10-CM

## 2022-06-19 DIAGNOSIS — G4733 Obstructive sleep apnea (adult) (pediatric): Secondary | ICD-10-CM

## 2022-06-19 DIAGNOSIS — G43009 Migraine without aura, not intractable, without status migrainosus: Secondary | ICD-10-CM

## 2022-06-19 DIAGNOSIS — E1129 Type 2 diabetes mellitus with other diabetic kidney complication: Secondary | ICD-10-CM

## 2022-06-19 DIAGNOSIS — E538 Deficiency of other specified B group vitamins: Secondary | ICD-10-CM

## 2022-06-20 NOTE — Telephone Encounter (Signed)
Requested medication (s) are due for refill today: yes  Requested medication (s) are on the active medication list: yes  Last refill:  08/12/21 1 each 5 refills  Future visit scheduled: yes in 6 days   Notes to clinic:  not delegated per protocol. Do you want to refill Rx?     Requested Prescriptions  Pending Prescriptions Disp Refills   FLOVENT HFA 110 MCG/ACT inhaler [Pharmacy Med Name: Flovent HFA 110 MCG/ACT Inhalation Aerosol] 36 g 0    Sig: Inhale 2 puffs by mouth twice daily     Not Delegated - Pulmonology:  Corticosteroids 2 Failed - 06/19/2022  4:56 PM      Failed - This refill cannot be delegated      Passed - Valid encounter within last 12 months    Recent Outpatient Visits           4 months ago Type 2 diabetes mellitus with proteinuria ([place])   [place], Gonzales T, NP   7 months ago Type 2 diabetes mellitus with proteinuria ([place])   [place], Crofton T, NP   10 months ago Type 2 diabetes mellitus with proteinuria ([place])   [place] Alba, Reserve T, NP   1 year ago Type 2 diabetes mellitus with diabetic polyneuropathy, with long-term current use of insulin ([place])   [place], Oahe Acres T, NP   1 year ago Type 2 diabetes mellitus with diabetic polyneuropathy, with long-term current use of insulin ([place])   [place], Barbaraann Faster, NP       Future Appointments             In 6 days Venita Lick, NP [place], [place]   In 1 month Jonathon Bellows, [place] [facility] at [facility]

## 2022-06-24 NOTE — Patient Instructions (Signed)
Be Involved in Your Health Care:  Taking Medications When medications are taken as directed, they can greatly improve your health. But if they are not taken as instructed, they may not work. In some cases, not taking them correctly can be harmful. To help ensure your treatment remains effective and safe, understand your medications and how to take them.  Your lab results, notes and after visit summary will be available on My Chart. We strongly encourage you to use this feature. If lab results are abnormal the clinic will contact you with the appropriate steps. If the clinic does not contact you assume the results are satisfactory. You can always see your results on My Chart. If you have questions regarding your condition, please contact the clinic during office hours. You can also ask questions on My Chart.  We at [facility] are grateful that you chose us to provide care. We strive to provide excellent and compassionate care and are always looking for feedback. If you get a survey from the clinic please complete this.   Diabetes Mellitus Basics  Diabetes mellitus, or diabetes, is a long-term (chronic) disease. It occurs when the body does not properly use sugar (glucose) that is released from food after you eat. Diabetes mellitus may be caused by one or both of these problems: Your pancreas does not make enough of a hormone called insulin. Your body does not react in a normal way to the insulin that it makes. Insulin lets glucose enter cells in your body. This gives you energy. If you have diabetes, glucose cannot get into cells. This causes high blood glucose (hyperglycemia). How to treat and manage diabetes You may need to take insulin or other diabetes medicines daily to keep your glucose in balance. If you are prescribed insulin, you will learn how to give yourself insulin by injection. You may need to adjust the amount of insulin you take based on the foods that you eat. You will  need to check your blood glucose levels using a glucose monitor as told by your health care provider. The readings can help determine if you have low or high blood glucose. Generally, you should have these blood glucose levels: Before meals (preprandial): 80-130 mg/dL (4.4-7.2 mmol/L). After meals (postprandial): below 180 mg/dL (10 mmol/L). Hemoglobin A1c (HbA1c) level: less than 7%. Your health care provider will set treatment goals for you. Keep all follow-up visits. This is important. Follow these instructions at home: Diabetes medicines Take your diabetes medicines every day as told by your health care provider. List your diabetes medicines here: Name of medicine: ______________________________ Amount (dose): _______________ Time (a.m./p.m.): _______________ Notes: ___________________________________ Name of medicine: ______________________________ Amount (dose): _______________ Time (a.m./p.m.): _______________ Notes: ___________________________________ Name of medicine: ______________________________ Amount (dose): _______________ Time (a.m./p.m.): _______________ Notes: ___________________________________ Insulin If you use insulin, list the types of insulin you use here: Insulin type: ______________________________ Amount (dose): _______________ Time (a.m./p.m.): _______________Notes: ___________________________________ Insulin type: ______________________________ Amount (dose): _______________ Time (a.m./p.m.): _______________ Notes: ___________________________________ Insulin type: ______________________________ Amount (dose): _______________ Time (a.m./p.m.): _______________ Notes: ___________________________________ Insulin type: ______________________________ Amount (dose): _______________ Time (a.m./p.m.): _______________ Notes: ___________________________________ Insulin type: ______________________________ Amount (dose): _______________ Time (a.m./p.m.): _______________  Notes: ___________________________________ Managing blood glucose  Check your blood glucose levels using a glucose monitor as told by your health care provider. Write down the times that you check your glucose levels here: Time: _______________ Notes: ___________________________________ Time: _______________ Notes: ___________________________________ Time: _______________ Notes: ___________________________________ Time: _______________ Notes: ___________________________________ Time: _______________ Notes: ___________________________________ Time: _______________ Notes: ___________________________________    Low blood glucose Low blood glucose (hypoglycemia) is when glucose is at or below 70 mg/dL (3.9 mmol/L). Symptoms may include: Feeling: Hungry. Sweaty and clammy. Irritable or easily upset. Dizzy. Sleepy. Having: A fast heartbeat. A headache. A change in your vision. Numbness around the mouth, lips, or tongue. Having trouble with: Moving (coordination). Sleeping. Treating low blood glucose To treat low blood glucose, eat or drink something containing sugar right away. If you can think clearly and swallow safely, follow the 15:15 rule: Take 15 grams of a fast-acting carb (carbohydrate), as told by your health care provider. Some fast-acting carbs are: Glucose tablets: take 3-4 tablets. Hard candy: eat 3-5 pieces. Fruit juice: drink 4 oz (120 mL). Regular (not diet) soda: drink 4-6 oz (120-180 mL). Honey or sugar: eat 1 Tbsp (15 mL). Check your blood glucose levels 15 minutes after you take the carb. If your glucose is still at or below 70 mg/dL (3.9 mmol/L), take 15 grams of a carb again. If your glucose does not go above 70 mg/dL (3.9 mmol/L) after 3 tries, get help right away. After your glucose goes back to normal, eat a meal or a snack within 1 hour. Treating very low blood glucose If your glucose is at or below 54 mg/dL (3 mmol/L), you have very low blood glucose  (severe hypoglycemia). This is an emergency. Do not wait to see if the symptoms will go away. Get medical help right away. Call your local emergency services (911 in the [place] Do not drive yourself to the hospital. Questions to ask your health care provider Should I talk with a diabetes educator? What equipment will I need to care for myself at home? What diabetes medicines do I need? When should I take them? How often do I need to check my blood glucose levels? What number can I call if I have questions? When is my follow-up visit? Where can I find a support group for people with diabetes? Where to find more information American Diabetes Association: www.diabetes.org Association of Diabetes Care and Education Specialists: www.diabeteseducator.org Contact a health care provider if: Your blood glucose is at or above 240 mg/dL (13.3 mmol/L) for 2 days in a row. You have been sick or have had a fever for 2 days or more, and you are not getting better. You have any of these problems for more than 6 hours: You cannot eat or drink. You feel nauseous. You vomit. You have diarrhea. Get help right away if: Your blood glucose is lower than 54 mg/dL (3 mmol/L). You get confused. You have trouble thinking clearly. You have trouble breathing. These symptoms may represent a serious problem that is an emergency. Do not wait to see if the symptoms will go away. Get medical help right away. Call your local emergency services (911 in the [place] Do not drive yourself to the hospital. Summary Diabetes mellitus is a chronic disease that occurs when the body does not properly use sugar (glucose) that is released from food after you eat. Take insulin and diabetes medicines as told. Check your blood glucose every day, as often as told. Keep all follow-up visits. This is important. This information is not intended to replace advice given to you by your health care provider. Make sure you discuss any  questions you have with your health care provider. Document Revised: 07/08/2019 Document Reviewed: 07/08/2019 Elsevier Patient Education  2023 Elsevier Inc.  

## 2022-06-26 ENCOUNTER — Ambulatory Visit (INDEPENDENT_AMBULATORY_CARE_PROVIDER_SITE_OTHER): Payer: BLUE CROSS/BLUE SHIELD | Admitting: Nurse Practitioner

## 2022-06-26 ENCOUNTER — Encounter: Payer: Self-pay | Admitting: Nurse Practitioner

## 2022-06-26 ENCOUNTER — Ambulatory Visit: Payer: BLUE CROSS/BLUE SHIELD | Admitting: Licensed Practical Nurse

## 2022-06-26 VITALS — BP 111/76 | HR 69 | Temp 97.7°F | Ht 62.09 in | Wt 200.1 lb

## 2022-06-26 DIAGNOSIS — R011 Cardiac murmur, unspecified: Secondary | ICD-10-CM

## 2022-06-26 DIAGNOSIS — E538 Deficiency of other specified B group vitamins: Secondary | ICD-10-CM

## 2022-06-26 DIAGNOSIS — E1129 Type 2 diabetes mellitus with other diabetic kidney complication: Secondary | ICD-10-CM

## 2022-06-26 DIAGNOSIS — J4530 Mild persistent asthma, uncomplicated: Secondary | ICD-10-CM

## 2022-06-26 DIAGNOSIS — E1159 Type 2 diabetes mellitus with other circulatory complications: Secondary | ICD-10-CM | POA: Diagnosis not present

## 2022-06-26 DIAGNOSIS — E1169 Type 2 diabetes mellitus with other specified complication: Secondary | ICD-10-CM | POA: Diagnosis not present

## 2022-06-26 DIAGNOSIS — G8929 Other chronic pain: Secondary | ICD-10-CM

## 2022-06-26 DIAGNOSIS — G4733 Obstructive sleep apnea (adult) (pediatric): Secondary | ICD-10-CM

## 2022-06-26 DIAGNOSIS — E1142 Type 2 diabetes mellitus with diabetic polyneuropathy: Secondary | ICD-10-CM

## 2022-06-26 DIAGNOSIS — E113293 Type 2 diabetes mellitus with mild nonproliferative diabetic retinopathy without macular edema, bilateral: Secondary | ICD-10-CM | POA: Diagnosis not present

## 2022-06-26 DIAGNOSIS — Z794 Long term (current) use of insulin: Secondary | ICD-10-CM

## 2022-06-26 DIAGNOSIS — R809 Proteinuria, unspecified: Secondary | ICD-10-CM

## 2022-06-26 DIAGNOSIS — M545 Low back pain, unspecified: Secondary | ICD-10-CM

## 2022-06-26 DIAGNOSIS — G63 Polyneuropathy in diseases classified elsewhere: Secondary | ICD-10-CM

## 2022-06-26 DIAGNOSIS — E785 Hyperlipidemia, unspecified: Secondary | ICD-10-CM

## 2022-06-26 DIAGNOSIS — E559 Vitamin D deficiency, unspecified: Secondary | ICD-10-CM

## 2022-06-26 DIAGNOSIS — I152 Hypertension secondary to endocrine disorders: Secondary | ICD-10-CM

## 2022-06-26 DIAGNOSIS — F419 Anxiety disorder, unspecified: Secondary | ICD-10-CM

## 2022-06-26 LAB — MICROALBUMIN, URINE WAIVED
Creatinine, Urine Waived: 50 mg/dL (ref 10–300)
Microalb, Ur Waived: 80 mg/L — ABNORMAL HIGH (ref 0–19)

## 2022-06-26 LAB — BAYER DCA HB A1C WAIVED: HB A1C (BAYER DCA - WAIVED): 7.1 % — ABNORMAL HIGH (ref 4.8–5.6)

## 2022-06-26 MED ORDER — LISINOPRIL 20 MG PO TABS: 20.0000 mg | ORAL_TABLET | Freq: Every day | ORAL | 4 refills | Status: DC

## 2022-06-26 MED ORDER — GLIPIZIDE 10 MG PO TABS: ORAL_TABLET | ORAL | 4 refills | Status: DC

## 2022-06-26 MED ORDER — TRAZODONE HCL 100 MG PO TABS: 150.0000 mg | ORAL_TABLET | Freq: Every day | ORAL | 4 refills | Status: DC

## 2022-06-26 MED ORDER — LIDOCAINE 5 % EX PTCH: 1.0000 | MEDICATED_PATCH | CUTANEOUS | 0 refills | Status: AC

## 2022-06-26 MED ORDER — GABAPENTIN 300 MG PO CAPS: 300.0000 mg | ORAL_CAPSULE | Freq: Three times a day (TID) | ORAL | 4 refills | Status: DC

## 2022-06-26 MED ORDER — VITAMIN D (ERGOCALCIFEROL) 1.25 MG (50000 UNIT) PO CAPS: 50000.0000 [IU] | ORAL_CAPSULE | ORAL | 4 refills | Status: DC

## 2022-06-26 MED ORDER — CITALOPRAM HYDROBROMIDE 20 MG PO TABS: ORAL_TABLET | ORAL | 4 refills | Status: DC

## 2022-06-26 MED ORDER — ROSUVASTATIN CALCIUM 20 MG PO TABS: 20.0000 mg | ORAL_TABLET | Freq: Every day | ORAL | 4 refills | Status: DC

## 2022-06-26 MED ORDER — OMEPRAZOLE 20 MG PO CPDR: 20.0000 mg | DELAYED_RELEASE_CAPSULE | Freq: Two times a day (BID) | ORAL | 4 refills | Status: DC

## 2022-06-26 NOTE — Assessment & Plan Note (Signed)
Chronic issue with recent flare, referral to PT per her request and complete treatment as prescribed by ER for UTI.

## 2022-06-26 NOTE — Assessment & Plan Note (Signed)
Chronic, ongoing with improved A1c today 7.1%, previous 7.3%.  Praised for this. - Continue Trulicity 3 MG weekly (4.5 MG dosing not available and may need to switch to Ozempic in future if ongoing shortage) and continue [place] daily with goal in future to reduce to discontinue insulin, discussed with patient.  At this time continue Glipizide 10 MG BID and Lantus 30 units, discussed with her that with medication changes if lows present she is to reduce this by 3 units every 3 days if needed = if BS in morning <130 consistently.   - Consider transition to [facility] in future if available and if still not <7% on max dose Trulicity.  Discussed with patient and she is interested in this change if needed. - Check BS TID and document for visits.   - Return in 3 months.

## 2022-06-26 NOTE — Assessment & Plan Note (Signed)
Chronic, ongoing. Was followed by [facility] in past, but PCP currently fills prescriptions.  Will return to them if any worsening mood.  Denies SI/HI.

## 2022-06-26 NOTE — Progress Notes (Signed)
BP 111/76   Pulse 69   Temp 97.7 F (36.5 C) (Oral)   Ht 5' 2.09" (1.577 m)   Wt 200 lb 1.6 oz (90.8 kg)   SpO2 99%   BMI 36.50 kg/m    Subjective:    Patient ID: Michelle Warner, female    DOB: 12-18-1975, 47 y.o.   MRN: [id number]  HPI: Michelle Warner is a 47 y.o. female  Chief Complaint  Patient presents with   Diabetes   Hypertension   Hyperlipidemia   Gastroesophageal Reflux   Migraine   ER F/U    Was seen in ER on 4/4 for UTI, was given Keflex   Would like referral for physical therapy for chronic back pain, went to this in past and felt benefit.  DIABETES A1c 7.3% in December (trend down) -- taking Trulicity 3 MG weekly, Farxiga 10 daily + Glipizide 10 MG BID and Lantus 30 units at night -- goal is to reduce insulin and discontinue if possible in long run. She continues on Gabapentin for neuropathy pain + Vitamin B12 supplements.  Vitamin D supplement weekly for history of low levels.  Has lost total of 32 pounds over past year with use of GLP1 and seen improvement of A1c.  2 pounds lost since last visit.  Took Metformin in past but this caused major GI issues. Hypoglycemic episodes:no Polydipsia/polyuria: no Visual disturbance: no Chest pain: no Paresthesias: no Glucose Monitoring: yes  Accucheck frequency: Daily  Fasting glucose: 84 to 191  Post prandial:  Evening:  Before meals: Taking Insulin?: yes  Long acting insulin: 30 units Lantus  Short acting insulin: Blood Pressure Monitoring: not checking Retinal Examination: Up to Date --  Eye, injections often Foot Exam: Up to Date Pneumovax: Refuses Influenza: Refuses Aspirin: no   HYPERTENSION / HYPERLIPIDEMIA Taking Lisinopril 20 MG daily and Crestor 10 MG daily.  History of sleep study with recommendation for weight loss, no CPAP. Satisfied with current treatment? yes Duration of hypertension: chronic BP monitoring frequency: rarely BP range:  BP medication side effects: no Duration of  hyperlipidemia: chronic Cholesterol medication side effects: no Cholesterol supplements: none Medication compliance: good compliance Aspirin: no Recent stressors: no Recurrent headaches: no Visual changes: no Palpitations: no Dyspnea: no Chest pain: no Lower extremity edema: no Dizzy/lightheaded: no   ASTHMA Uses Flovent 2 puffs BID + Albuterol PRN. Asthma status: stable Satisfied with current treatment?: yes Albuterol/rescue inhaler frequency:  Dyspnea frequency: no Wheezing frequency: no Cough frequency: occasional Nocturnal symptom frequency: no Limitation of activity: no Current upper respiratory symptoms: no Triggers: pollen Home peak flows:none Aerochamber/spacer use: no Visits to ER or Urgent Care in past year: no Pneumovax:  refused Influenza:  refused    DEPRESSION Continues on Celexa and Trazodone.  Saw [facility] in past and remains stable on medication regimen.   Mood status: stable Satisfied with current treatment?: yes Symptom severity: moderate  Duration of current treatment : chronic Side effects: no Medication compliance: good compliance Psychotherapy/counseling: none Depressed mood: no Anxious mood: occasionally Anhedonia: no Significant weight loss or gain: no Insomnia: yes hard to fall asleep Fatigue: yes Feelings of worthlessness or guilt: no Impaired concentration/indecisiveness: no Suicidal ideations: no Hopelessness: no Crying spells: no    06/26/2022    1:10 PM 02/20/2022    2:31 PM 11/18/2021    4:08 PM 08/12/2021    9:09 AM 05/05/2021    9:55 AM  Depression screen PHQ 2/9  Decreased Interest 0 0 0 0 0  Down, Depressed, Hopeless 1 0 0 0 0  PHQ - 2 Score 1 0 0 0 0  Altered sleeping 1 1 2 3 1   Tired, decreased energy 2 3 1 3 1   Change in appetite 0 0 0 0 0  Feeling bad or failure about yourself  0 0 0 0 0  Trouble concentrating 1 0 1 0 0  Moving slowly or fidgety/restless 0 0 0 0 0  Suicidal thoughts 0 0 0 0 0  PHQ-9 Score 5 4 4 6 2    Difficult doing work/chores Not difficult at all Not difficult at all Not difficult at all         06/26/2022    1:11 PM 02/20/2022    2:31 PM 11/18/2021    4:08 PM 08/12/2021    9:10 AM  GAD 7 : Generalized Anxiety Score  Nervous, Anxious, on Edge 2 1 2 1   Control/stop worrying 1 2 1  0  Worry too much - different things 1 1 3  0  Trouble relaxing 1 0 1 2  Restless 1 0 0 0  Easily annoyed or irritable 2 1 3 2   Afraid - awful might happen 0 0 0 0  Total GAD 7 Score 8 5 10 5   Anxiety Difficulty Not difficult at all Not difficult at all Not difficult at all Not difficult at all   Relevant past medical, surgical, family and social history reviewed and updated as indicated. Interim medical history since our last visit reviewed. Allergies and medications reviewed and updated.  Review of Systems  Constitutional:  Negative for activity change, appetite change, diaphoresis, fatigue and fever.  Respiratory:  Negative for cough, chest tightness, shortness of breath and wheezing.   Cardiovascular:  Negative for chest pain, palpitations and leg swelling.  Gastrointestinal: Negative.   Endocrine: Negative for cold intolerance, heat intolerance, polydipsia, polyphagia and polyuria.  Skin:  Positive for wound.  Neurological: Negative.   Psychiatric/Behavioral: Negative.     Per HPI unless specifically indicated above     Objective:    BP 111/76   Pulse 69   Temp 97.7 F (36.5 C) (Oral)   Ht 5' 2.09" (1.577 m)   Wt 200 lb 1.6 oz (90.8 kg)   SpO2 99%   BMI 36.50 kg/m   Wt Readings from Last 3 Encounters:  06/26/22 200 lb 1.6 oz (90.8 kg)  02/20/22 202 lb 9.6 oz (91.9 kg)  01/18/22 202 lb (91.6 kg)    Physical Exam Vitals and nursing note reviewed.  Constitutional:      General: She is awake. She is not in acute distress.    Appearance: She is well-developed and well-groomed. She is obese. She is not ill-appearing or toxic-appearing.  HENT:     Head: Normocephalic.     Right Ear:  Hearing normal.     Left Ear: Hearing normal.  Eyes:     General: Lids are normal.        Right eye: No discharge.        Left eye: No discharge.     Pupils: Pupils are equal, round, and reactive to light.     Funduscopic exam:    Right eye: No hemorrhage.        Left eye: No hemorrhage.  Neck:     Thyroid: No thyromegaly.     Vascular: No carotid bruit.  Cardiovascular:     Rate and Rhythm: Normal rate and regular rhythm.     Heart sounds: Murmur heard.  Systolic murmur is present with a grade of 3/6.     No gallop.  Pulmonary:     Effort: Pulmonary effort is normal. No accessory muscle usage or respiratory distress.     Breath sounds: Normal breath sounds.  Abdominal:     General: Bowel sounds are normal.     Palpations: Abdomen is soft.  Musculoskeletal:     Cervical back: Normal range of motion and neck supple.     Right lower leg: No edema.     Left lower leg: No edema.  Lymphadenopathy:     Head:     Right side of head: No submental, submandibular, tonsillar, preauricular or posterior auricular adenopathy.     Left side of head: No submental, submandibular, tonsillar, preauricular or posterior auricular adenopathy.     Cervical: No cervical adenopathy.  Skin:    General: Skin is warm and dry.  Neurological:     Mental Status: She is alert and oriented to person, place, and time.  Psychiatric:        Attention and Perception: Attention normal.        Mood and Affect: Mood normal.        Speech: Speech normal.        Behavior: Behavior normal. Behavior is cooperative.        Thought Content: Thought content normal.    Diabetic Foot Exam - Simple   Simple Foot Form Visual Inspection See comments: Yes Sensation Testing See comments: Yes Pulse Check Posterior Tibialis and Dorsalis pulse intact bilaterally: Yes Comments Small callus to posterior right heel, skin intact.  Sensation right 7/10 and left 8/10.     Results for orders placed or performed in  visit on 02/20/22  Bayer DCA Hb A1c Waived  Result Value Ref Range   HB A1C (BAYER DCA - WAIVED) 7.3 (H) 4.8 - 5.6 %  Basic metabolic panel  Result Value Ref Range   Glucose 75 70 - 99 mg/dL   BUN 11 6 - 24 mg/dL   Creatinine, Ser 1.910.61 0.57 - 1.00 mg/dL   eGFR 478113 >29>59 FA/OZH/0.86mL/min/1.73   BUN/Creatinine Ratio 18 9 - 23   Sodium 142 134 - 144 mmol/L   Potassium 4.2 3.5 - 5.2 mmol/L   Chloride 103 96 - 106 mmol/L   CO2 24 20 - 29 mmol/L   Calcium 9.3 8.7 - 10.2 mg/dL  Lipid Panel w/o Chol/HDL Ratio  Result Value Ref Range   Cholesterol, Total 128 100 - 199 mg/dL   Triglycerides 578135 0 - 149 mg/dL   HDL 43 >46>39 mg/dL   VLDL Cholesterol Cal 24 5 - 40 mg/dL   LDL Chol Calc (NIH) 61 0 - 99 mg/dL  Magnesium  Result Value Ref Range   Magnesium 1.9 1.6 - 2.3 mg/dL      Assessment & Plan:   Problem List Items Addressed This Visit       Cardiovascular and Mediastinum   Hypertension associated with diabetes    Chronic, stable.  BP at goal in office.  Currently on Lisinopril for kidney protection and BP, may benefit from switch to ARB in future due to her underlying asthma.  Recommend she monitor BP at least a few mornings a week at home and document.  DASH diet at home.  Labs today: CBC, CMP, TSH, urine ALB.  Urine ALB 80 April 2024.  Return in 3 months.       Relevant Medications   glipiZIDE (GLUCOTROL) 10 MG tablet  lisinopril (ZESTRIL) 20 MG tablet   rosuvastatin (CRESTOR) 20 MG tablet   Other Relevant Orders   Bayer DCA Hb A1c Waived   Comprehensive metabolic panel   CBC with Differential/Platelet   TSH     Respiratory   Mild persistent asthma without complication    Chronic, ongoing, and stable with inhaler use at this time.  Continue current inhaler regimen as ordered and adjust as needed.  Plan on spirometry at future visit.  Continue modest weight loss.  Labs today: CBC.        Relevant Orders   CBC with Differential/Platelet   Obstructive sleep apnea of adult    Reports  she was told she did not need CPAP, but to focus on weight loss -- may benefit repeat study in future.      Relevant Orders   CBC with Differential/Platelet     Endocrine   Type 2 diabetes mellitus with diabetic polyneuropathy, with long-term current use of insulin    Chronic, ongoing with improved A1c today 7.1%, previous 7.3%.  Continue Gabapentin and B12 for neuropathy.   - Continue Trulicity 3 MG weekly (4.5 MG dosing not available and may need to switch to Ozempic in future if ongoing shortage) and continue [place] daily with goal in future to reduce to discontinue insulin, discussed with patient.  At this time continue Glipizide 10 MG BID and Lantus 30 units, discussed with her that with medication changes if lows present she is to reduce this by 3 units every 3 days if needed = if BS in morning <130 consistently.   - Consider transition to [facility] in future if available and if still not <7% on max dose Trulicity.  Discussed with patient and she is interested in this change if needed. - Check BS TID and document for visits.   - Return in 3 months.      Relevant Medications   methocarbamol (ROBAXIN) 500 MG tablet   traZODone (DESYREL) 100 MG tablet   citalopram (CELEXA) 20 MG tablet   gabapentin (NEURONTIN) 300 MG capsule   glipiZIDE (GLUCOTROL) 10 MG tablet   lisinopril (ZESTRIL) 20 MG tablet   rosuvastatin (CRESTOR) 20 MG tablet   Other Relevant Orders   Bayer DCA Hb A1c Waived   Comprehensive metabolic panel   Hyperlipidemia associated with type 2 diabetes mellitus    Chronic, ongoing.  Continue current medication regimen and adjust as needed.  Lipid panel today.      Relevant Medications   glipiZIDE (GLUCOTROL) 10 MG tablet   lisinopril (ZESTRIL) 20 MG tablet   rosuvastatin (CRESTOR) 20 MG tablet   Other Relevant Orders   Bayer DCA Hb A1c Waived   Comprehensive metabolic panel   Lipid Panel w/o Chol/HDL Ratio   Type 2 diabetes mellitus with both eyes affected by mild  nonproliferative retinopathy without macular edema, with long-term current use of insulin    Chronic, ongoing with improved A1c today 7.1%, previous 7.3%.  Praised for this. - Continue Trulicity 3 MG weekly (4.5 MG dosing not available and may need to switch to Ozempic in future if ongoing shortage) and continue [place] daily with goal in future to reduce to discontinue insulin, discussed with patient.  At this time continue Glipizide 10 MG BID and Lantus 30 units, discussed with her that with medication changes if lows present she is to reduce this by 3 units every 3 days if needed = if BS in morning <130 consistently.   - Consider transition to [facility]  in future if available and if still not <7% on max dose Trulicity.  Discussed with patient and she is interested in this change if needed. - Check BS TID and document for visits.   - Return in 3 months.      Relevant Medications   glipiZIDE (GLUCOTROL) 10 MG tablet   lisinopril (ZESTRIL) 20 MG tablet   rosuvastatin (CRESTOR) 20 MG tablet   Other Relevant Orders   Bayer DCA Hb A1c Waived   Type 2 diabetes mellitus with proteinuria - Primary    Refer to diabetes with neuropathy plan of care and continue Lisinopril for kidney protection. Urine ALB 80 April 2024.      Relevant Medications   glipiZIDE (GLUCOTROL) 10 MG tablet   lisinopril (ZESTRIL) 20 MG tablet   rosuvastatin (CRESTOR) 20 MG tablet   Other Relevant Orders   Bayer DCA Hb A1c Waived   Microalbumin, Urine Waived   Comprehensive metabolic panel     Nervous and Auditory   Vitamin B12 deficiency neuropathy    Ongoing.  Continue OTC supplement which is offering benefit to her neuropathy.  Recheck level today.      Relevant Medications   methocarbamol (ROBAXIN) 500 MG tablet   traZODone (DESYREL) 100 MG tablet   citalopram (CELEXA) 20 MG tablet   gabapentin (NEURONTIN) 300 MG capsule   Other Relevant Orders   CBC with Differential/Platelet   Vitamin B12     Other    Anxiety    Chronic, ongoing. Was followed by [facility] in past, but PCP currently fills prescriptions.  Will return to them if any worsening mood.  Denies SI/HI.      Relevant Medications   traZODone (DESYREL) 100 MG tablet   citalopram (CELEXA) 20 MG tablet   Chronic bilateral low back pain without sciatica    Chronic issue with recent flare, referral to PT per her request and complete treatment as prescribed by ER for UTI.      Relevant Medications   methocarbamol (ROBAXIN) 500 MG tablet   traZODone (DESYREL) 100 MG tablet   citalopram (CELEXA) 20 MG tablet   gabapentin (NEURONTIN) 300 MG capsule   Other Relevant Orders   Ambulatory referral to Physical Therapy   Heart murmur, systolic    Ongoing with history of work-up in past.  More prominent on exam today, will order repeat echo to check on this and send to cardiology as needed.  She denies any symptoms.      Relevant Orders   ECHOCARDIOGRAM COMPLETE   Morbid obesity    BMI 36.50, has lost total of 32 pounds with Trulicity on board (may change to [facility] next visit if A1c remains >7%, this would offer further weight loss benefit), with T2DM, HLD, HTN.  Recommended eating smaller high protein, low fat meals more frequently and exercising 30 mins a day 5 times a week with a goal of 10-15lb weight loss in the next 3 months. Patient voiced their understanding and motivation to adhere to these recommendations.       Relevant Medications   glipiZIDE (GLUCOTROL) 10 MG tablet   Vitamin D deficiency    Chronic, ongoing, taking weekly supplement.  Continue this supplement and adjust as needed. Vit D level today.      Relevant Orders   VITAMIN D 25 Hydroxy (Vit-D Deficiency, Fractures)     Follow up plan: Return in about 3 months (around 09/25/2022) for T2DM, HTN/HLD, MOOD, GERD, ASTHMA, VIT D.

## 2022-06-26 NOTE — Assessment & Plan Note (Signed)
Chronic, ongoing.  Continue current medication regimen and adjust as needed. Lipid panel today. 

## 2022-06-26 NOTE — Assessment & Plan Note (Signed)
Ongoing with history of work-up in past.  More prominent on exam today, will order repeat echo to check on this and send to cardiology as needed.  She denies any symptoms. 

## 2022-06-26 NOTE — Assessment & Plan Note (Signed)
Chronic, ongoing with improved A1c today 7.1%, previous 7.3%.  Continue Gabapentin and B12 for neuropathy.   - Continue Trulicity 3 MG weekly (4.5 MG dosing not available and may need to switch to Ozempic in future if ongoing shortage) and continue [place] daily with goal in future to reduce to discontinue insulin, discussed with patient.  At this time continue Glipizide 10 MG BID and Lantus 30 units, discussed with her that with medication changes if lows present she is to reduce this by 3 units every 3 days if needed = if BS in morning <130 consistently.   - Consider transition to [facility] in future if available and if still not <7% on max dose Trulicity.  Discussed with patient and she is interested in this change if needed. - Check BS TID and document for visits.   - Return in 3 months.

## 2022-06-26 NOTE — Assessment & Plan Note (Signed)
Chronic, ongoing, and stable with inhaler use at this time.  Continue current inhaler regimen as ordered and adjust as needed.  Plan on spirometry at future visit.  Continue modest weight loss.  Labs today: CBC.

## 2022-06-26 NOTE — Assessment & Plan Note (Signed)
Reports she was told she did not need CPAP, but to focus on weight loss -- may benefit repeat study in future. °

## 2022-06-26 NOTE — Assessment & Plan Note (Signed)
Chronic, ongoing, taking weekly supplement.  Continue this supplement and adjust as needed. Vit D level today. °

## 2022-06-26 NOTE — Assessment & Plan Note (Signed)
Chronic, stable.  BP at goal in office.  Currently on Lisinopril for kidney protection and BP, may benefit from switch to ARB in future due to her underlying asthma.  Recommend she monitor BP at least a few mornings a week at home and document.  DASH diet at home.  Labs today: CBC, CMP, TSH, urine ALB.  Urine ALB 80 April 2024.  Return in 3 months.

## 2022-06-26 NOTE — Assessment & Plan Note (Signed)
Ongoing.  Continue OTC supplement which is offering benefit to her neuropathy.  Recheck level today.

## 2022-06-26 NOTE — Assessment & Plan Note (Signed)
Refer to diabetes with neuropathy plan of care and continue Lisinopril for kidney protection. Urine ALB 80 April 2024.

## 2022-06-26 NOTE — Assessment & Plan Note (Signed)
BMI 36.50, has lost total of 32 pounds with Trulicity on board (may change to [facility] next visit if A1c remains >7%, this would offer further weight loss benefit), with T2DM, HLD, HTN.  Recommended eating smaller high protein, low fat meals more frequently and exercising 30 mins a day 5 times a week with a goal of 10-15lb weight loss in the next 3 months. Patient voiced their understanding and motivation to adhere to these recommendations.

## 2022-06-27 LAB — CBC WITH DIFFERENTIAL/PLATELET
Basophils Absolute: 0 10*3/uL (ref 0.0–0.2)
Basos: 1 %
EOS (ABSOLUTE): 0.1 10*3/uL (ref 0.0–0.4)
Eos: 2 %
Hematocrit: 42.3 % (ref 34.0–46.6)
Hemoglobin: 14 g/dL (ref 11.1–15.9)
Immature Grans (Abs): 0 10*3/uL (ref 0.0–0.1)
Immature Granulocytes: 0 %
Lymphocytes Absolute: 2.5 10*3/uL (ref 0.7–3.1)
Lymphs: 39 %
MCH: 29.9 pg (ref 26.6–33.0)
MCHC: 33.1 g/dL (ref 31.5–35.7)
MCV: 90 fL (ref 79–97)
Monocytes Absolute: 0.4 10*3/uL (ref 0.1–0.9)
Monocytes: 6 %
Neutrophils Absolute: 3.5 10*3/uL (ref 1.4–7.0)
Neutrophils: 52 %
Platelets: 292 10*3/uL (ref 150–450)
RBC: 4.69 x10E6/uL (ref 3.77–5.28)
RDW: 12.4 % (ref 11.7–15.4)
WBC: 6.6 10*3/uL (ref 3.4–10.8)

## 2022-06-27 LAB — COMPREHENSIVE METABOLIC PANEL
ALT: 14 IU/L (ref 0–32)
AST: 14 IU/L (ref 0–40)
Albumin/Globulin Ratio: 1.7 (ref 1.2–2.2)
Albumin: 4.4 g/dL (ref 3.9–4.9)
Alkaline Phosphatase: 66 IU/L (ref 44–121)
BUN/Creatinine Ratio: 21 (ref 9–23)
BUN: 15 mg/dL (ref 6–24)
Bilirubin Total: 0.3 mg/dL (ref 0.0–1.2)
CO2: 26 mmol/L (ref 20–29)
Calcium: 9.3 mg/dL (ref 8.7–10.2)
Chloride: 103 mmol/L (ref 96–106)
Creatinine, Ser: 0.71 mg/dL (ref 0.57–1.00)
Globulin, Total: 2.6 g/dL (ref 1.5–4.5)
Glucose: 98 mg/dL (ref 70–99)
Potassium: 4.4 mmol/L (ref 3.5–5.2)
Sodium: 140 mmol/L (ref 134–144)
Total Protein: 7 g/dL (ref 6.0–8.5)
eGFR: 107 mL/min/{1.73_m2} (ref >59)

## 2022-06-27 LAB — VITAMIN D 25 HYDROXY (VIT D DEFICIENCY, FRACTURES): Vit D, 25-Hydroxy: 60.7 ng/mL (ref 30.0–100.0)

## 2022-06-27 LAB — LIPID PANEL W/O CHOL/HDL RATIO
Cholesterol, Total: 134 mg/dL (ref 100–199)
HDL: 47 mg/dL (ref >39)
LDL Chol Calc (NIH): 67 mg/dL (ref 0–99)
Triglycerides: 110 mg/dL (ref 0–149)
VLDL Cholesterol Cal: 20 mg/dL (ref 5–40)

## 2022-06-27 LAB — VITAMIN B12: Vitamin B-12: 356 pg/mL (ref 232–1245)

## 2022-06-27 LAB — TSH: TSH: 2.41 u[IU]/mL (ref 0.450–4.500)

## 2022-06-27 NOTE — Progress Notes (Signed)
Contacted via MyChart   Good afternoon Shalandria, your labs have returned and overall look FANTASTIC!!  The only thing I would do is ensure you are taking Vitamin B12 1000 MCG daily for lower levels of B12 -- this is important for nervous system health.  I am so proud of you and the direction you are going!! Keep being amazing!!  Thank you for allowing me to participate in your care.  I appreciate you. Kindest regards, Demetria Lightsey

## 2022-07-11 NOTE — Therapy (Unsigned)
OUTPATIENT PHYSICAL THERAPY EVALUATION   Patient Name: Michelle Warner MRN: [id number] DOB:07/16/1975, 47 y.o., female Today's Date: 07/11/2022  END OF SESSION:   Past Medical History:  Diagnosis Date   Anxiety    Appendicitis    Depression    Diabetes mellitus without complication    Heart murmur    Hypercholesterolemia    Hypertension    Past Surgical History:  Procedure Laterality Date   APPENDECTOMY     CESAREAN SECTION     LAPAROSCOPIC APPENDECTOMY N/A 12/08/2014   Procedure: APPENDECTOMY LAPAROSCOPIC;  Surgeon: Natale Lay, MD;  Location: [facility] ORS;  Service: General;  Laterality: N/A;   TONSILLECTOMY     Patient Active Problem List   Diagnosis Date Noted   Heart murmur, systolic 02/20/2022   Type O blood, Rh positive 08/07/2021   Vitamin B12 deficiency neuropathy 06/05/2021   Type 2 diabetes mellitus with both eyes affected by mild nonproliferative retinopathy without macular edema, with long-term current use of insulin 05/05/2021   Type 2 diabetes mellitus with proteinuria 05/05/2021   Chronic bilateral low back pain without sciatica 06/27/2019   Anxiety 07/24/2018   Mild persistent asthma without complication 07/24/2018   Type 2 diabetes mellitus with diabetic polyneuropathy, with long-term current use of insulin 07/24/2018   Vitamin D deficiency 07/24/2018   IUD (intrauterine device) in place 06/10/2015   Uterine leiomyoma 06/10/2015   Obstructive sleep apnea of adult 08/20/2014   Morbid obesity 11/26/2007   Allergic rhinitis, seasonal 09/04/2007   Migraine without aura and responsive to treatment 05/01/2007   Gastro-esophageal reflux disease without esophagitis 09/04/2006   Hypertension associated with diabetes 09/04/2006   Hyperlipidemia associated with type 2 diabetes mellitus 09/04/2006    PCP: Marjie Skiff, NP   REFERRING PROVIDER: Marjie Skiff, NP   REFERRING DIAG: chronic bilateral low back pain without sciatica  Rationale for Evaluation  and Treatment: Rehabilitation  THERAPY DIAG:  No diagnosis found.  ONSET DATE: chronic  SUBJECTIVE:                                                                                                                                                                                           SUBJECTIVE STATEMENT: ***  PERTINENT HISTORY:  Patient is a 47 y.o. female who presents to outpatient physical therapy with a referral for medical diagnosis chronic bilateral low back pain without sciatica. This patient's chief complaints consist of ***, leading to the following functional deficits: ***. Relevant past medical history and comorbidities include migraine, GERD, HTN, HLD, OSA, allergic rhinitis, morbid obesity, anxiety, mild persistent asthma, type 2 diabetes mellitus with diabetic polyneuropathy with long  term use of insulin and both eyes affected by mild non-proliferative retinopathy without macular edema, heart murmer, uterine leiomyoma, .  Patient denies hx of {redflags:27294}   PAIN:  Are you having pain? Yes: NPRS scale: Current: ***/10,  Best: ***/10, Worst: ***/10. Pain location: *** Pain description: *** Aggravating factors: *** Relieving factors: ***   FUNCTIONAL LIMITATIONS: ***  LEISURE: ***  PRECAUTIONS: {Therapy precautions:24002}  WEIGHT BEARING RESTRICTIONS: {Yes ***/No:24003}  FALLS:  Has patient fallen in last 6 months? {fallsyesno:27318}  LIVING ENVIRONMENT: Lives with: {[facility] lives with:25569::"lives with their family"} Lives in: {Lives in:25570} Stairs: {opstairs:27293} Has following equipment at home: {Assistive devices:23999}  OCCUPATION: ***  PLOF: {PLOF:24004}  PATIENT GOALS: ***  NEXT MD VISIT: ***   OBJECTIVE  DIAGNOSTIC FINDINGS:  No recent imaging  SELF- REPORTED FUNCTION FOTO score: ***/100 (lumbar spine questionnaire)  OBSERVATION/INSPECTION Posture Posture (seated): forward head, rounded shoulders, slumped in sitting.  Posture  (standing): *** Posture correction: *** Anthropometrics Tremor: none Body composition: *** Muscle bulk: *** Skin: The incision sites appear to be healing well with no excessive redness, warmth, drainage or signs of infection present.  *** Edema: *** Functional Mobility Bed mobility: *** Transfers: *** Gait: grossly WFL for household and short community ambulation. More detailed gait analysis deferred to later date as needed. *** Stairs: ***  SPINE MOTION  LUMBAR SPINE AROM *Indicates pain Flexion: *** Extension: *** Side Flexion:   R ***  L *** Rotation:  R *** L *** Side glide:  R *** L ***   NEUROLOGICAL  Upper Motor Neuron Screen Babinski, Hoffman's and Clonus (ankle) negative bilaterally.  Dermatomes C2-T1 appears equal and intact to light touch except the following: *** L2-S2 appears equal and intact to light touch except the following: *** Deep Tendon Reflexes R/L  ***+/***+ Biceps brachii reflex (C5, C6) ***+/***+ Brachioradialis reflex (C6) ***+/***+ Triceps brachii reflex (C7) ***+/***+ Quadriceps reflex (L4) ***+/***+ Achilles reflex (S1)  SPINE MOTION  CERVICAL SPINE AROM *Indicates pain Flexion: *** Extension: *** Side Flexion:   R ***  L *** Rotation:  R *** L ***   PERIPHERAL JOINT MOTION (in degrees)  ACTIVE RANGE OF MOTION (AROM) *Indicates pain Date Date Date  Joint/Motion R/L R/L R/L  Shoulder     Flexion / / /  Extension / / /  Abduction  / / /  External rotation / / /  Internal rotation / / /  Elbow     Flexion  / / /  Extension  / / /  Wrist     Flexion / / /  Extension  / / /  Radial deviation / / /  Ulnar deviation / / /  Pronation / / /  Supination / / /  Hip     Flexion / / /  Extension  / / /  Abduction / / /  Adduction / / /  External rotation / / /  Internal rotation  / / /  Knee     Extension / / /  Flexoin / / /  Ankle/Foot     Dorsiflexion (knee ext) / / /  Dorsiflexion (knee flex) / / /   Plantarflexion / / /  Everison / / /  Inversion / / /  Great toe extension / / /  Great toe flexion / / /  Comments:   PASSIVE RANGE OF MOTION (PROM) *Indicates pain Date Date Date  Joint/Motion R/L R/L R/L  Shoulder     Flexion / / /  Extension / / /  Abduction  / / /  External rotation / / /  Internal rotation / / /  Elbow     Flexion  / / /  Extension  / / /  Wrist     Flexion / / /  Extension  / / /  Radial deviation / / /  Ulnar deviation / / /  Pronation / / /  Supination / / /  Hip     Flexion  / / /  Extension  / / /  Abduction / / /  Adduction / / /  External rotation / / /  Internal rotation  / / /  Knee     Extension / / /  Flexion / / /  Ankle/Foot     Dorsiflexion (knee ext) / / /  Dorsiflexion (knee flex) / / /  Plantarflexion / / /  Everison / / /  Inversion / / /  Great toe extension / / /  Great toe flexion / / /  Comments:   MUSCLE PERFORMANCE (MMT):  *Indicates pain Date Date Date  Joint/Motion R/L R/L R/L  Shoulder     Flexion / / /  Abduction (C5) / / /  External rotation / / /  Internal rotation / / /  Extension / / /  Elbow     Flexion (C6) / / /  Extension (C7) / / /  Wrist     Flexion (C7) / / /  Extension (C6) / / /  Radial deviation / / /  Ulnar deviation (C8) / / /  Pronation / / /  Supination / / /  Hand     Thumb extension (C8) / / /  Finger abduction (T1) / / /  Grip (C8) / / /  Hip     Flexion (L1, L2) / / /  Extension (knee ext) / / /  Extension (knee flex) / / /  Abduction / / /  Adduction / / /  External rotation / / /  Internal rotation  / / /  Knee     Extension (L3) / / /  Flexion (S2) / / /  Ankle/Foot     Dorsiflexion (L4) / / /  Great toe extension (L5) / / /  Eversion (S1) / / /  Plantarflexion (S1) / / /  Inversion / / /  Pronation / / /  Great toe flexion / / /  Comments:   SPECIAL  TESTS:  .Neurodynamictests .NeurodynamicUE .NeurodynamicLE .CspineInstability .CSPINESPECIALTESTS .SHOULDERSPECIALTESTCLUSTERS .HIPSPECIALTESTS .SIJSPECIALTESTS   SHOULDER SPECIAL TESTS RTC, Impingement, Anterior Instability (macrotrauma), Labral Tear: Painful arc test: R = ***, L = ***. Drop arm test: R = ***, L = ***. Hawkins-Kennedy test: R = ***, L = ***. Infraspinatus test: R = ***, L = ***. Apprehension test: R = ***, L = ***. Relocation test: R = ***, L = ***. Active compression test: R = ***, L = ***.  ACCESSORY MOTION: ***  PALPATION: ***  SUSTAINED POSITIONS TESTING:  ***  REPEATED MOTIONS TESTING: ***  FUNCTIONAL/BALANCE TESTS: Five Time Sit to Stand (5TSTS): *** seconds Functional Gait Assessment (FGA): ***/30 (see details above) Ten meter walking trial ( ): *** m/s Six Minute Walk Test ( ): *** feet Timed Up and Go (TUG): *** seconds   Dynamic Gait Index: ***/24 BERG Balance Scale: ***/56 Tinetti/POMA: ***/28 Timed Up and GO: *** seconds (average of 3 trials) Trial 1: *** Trial 2: ***  Trial 3: *** Romberg test: -Narrow stance, eyes open: *** seconds -Narrow stance, eyes closed: *** seconds Sharpened Romberg test: -Tandem stance, eyes open: *** seconds -Tandem stance, eyes closed: *** seconds  Narrow stance, firm surface, eyes open: *** seconds Narrow stance, firm surface, eyes closed: *** seconds Narrow stance, compliant surface, eyes open: *** seconds Narrow stance, compliant surface, eyes closed: *** seconds Single leg stance, firm surface, eyes open: R= *** seconds, L= *** seconds Single leg stance, compliant surface, eyes open: R= *** seconds, L= *** seconds Gait speed: *** m/s Functional reach test: *** inches      TODAY'S TREATMENT:    PATIENT EDUCATION:  Education details: *** Person educated: {Person educated:25204} Education method: {Education Method:25205} Education comprehension: {Education  Comprehension:25206}  HOME EXERCISE PROGRAM: ***  ASSESSMENT:  CLINICAL IMPRESSION: Patient is a 47 y.o. female referred to outpatient physical therapy with a medical diagnosis of chronic bilateral low back pain without sciatica who presents with signs and symptoms consistent with ***. Patient presents with significant *** impairments that are limiting ability to complete *** without difficulty. Patient will benefit from skilled physical therapy intervention to address current body structure impairments and activity limitations to improve function and work towards goals set in current POC in order to return to prior level of function or maximal functional improvement.   OBJECTIVE IMPAIRMENTS: {opptimpairments:25111}.   ACTIVITY LIMITATIONS: {activitylimitations:27494}  PARTICIPATION LIMITATIONS: {participationrestrictions:25113}  PERSONAL FACTORS: {Personal factors:25162} are also affecting patient's functional outcome.   REHAB POTENTIAL: {rehabpotential:25112}  CLINICAL DECISION MAKING: {clinical decision making:25114}  EVALUATION COMPLEXITY: {Evaluation complexity:25115}   GOALS: Goals reviewed with patient? No  SHORT TERM GOALS: Target date: 07/25/2022  Patient will be independent with initial home exercise program for self-management of symptoms. Baseline: {HEPbaseline4:27310} (07/11/22); Goal status: INITIAL   LONG TERM GOALS: Target date: 10/03/2022  Patient will be independent with a long-term home exercise program for self-management of symptoms.  Baseline: {HEPbaseline4:27310} (07/11/22); Goal status: INITIAL  2.  Patient will demonstrate improved FOTO to equal or greater than *** by visit #*** to demonstrate improvement in overall condition and self-reported functional ability.  Baseline: *** (07/11/22); Goal status: INITIAL  3.  *** Baseline: *** (07/11/22); Goal status: INITIAL  4.  *** Baseline: *** (07/11/22); Goal status: INITIAL  5.  Patient will  complete community, work and/or recreational activities without limitation due to current condition.  Baseline: *** (07/11/22); Goal status: INITIAL  6.  *** Baseline: *** Goal status: INITIAL   PLAN:  PT FREQUENCY: 1-2x/week  PT DURATION: 12 weeks  PLANNED INTERVENTIONS: {rehab planned interventions:25118::"Therapeutic exercises","Therapeutic activity","Neuromuscular re-education","Balance training","Gait training","Patient/Family education","Self Care","Joint mobilization"}.  PLAN FOR NEXT SESSION: Progressive core/LE/functional strengthening and ROM exercises. Manual therapy and dry needling as needed and appropriate. Education.    Cira Rue, PT, DPT 07/11/2022, 1:55 PM  [facility] [address] [place], [place] [address] P: [phone] I F: [phone]

## 2022-07-12 ENCOUNTER — Ambulatory Visit: Payer: BLUE CROSS/BLUE SHIELD | Admitting: Obstetrics

## 2022-07-13 ENCOUNTER — Ambulatory Visit: Payer: Medicaid Other | Admitting: Physical Therapy

## 2022-07-13 NOTE — Therapy (Deleted)
OUTPATIENT PHYSICAL THERAPY EVALUATION   Patient Name: Michelle Warner MRN: [id number] DOB:1976/02/10, 47 y.o., female Today's Date: 07/13/2022  END OF SESSION:   Past Medical History:  Diagnosis Date   Anxiety    Appendicitis    Depression    Diabetes mellitus without complication    Heart murmur    Hypercholesterolemia    Hypertension    Past Surgical History:  Procedure Laterality Date   APPENDECTOMY     CESAREAN SECTION     LAPAROSCOPIC APPENDECTOMY N/A 12/08/2014   Procedure: APPENDECTOMY LAPAROSCOPIC;  Surgeon: Natale Lay, MD;  Location: [facility] ORS;  Service: General;  Laterality: N/A;   TONSILLECTOMY     Patient Active Problem List   Diagnosis Date Noted   Heart murmur, systolic 02/20/2022   Type O blood, Rh positive 08/07/2021   Vitamin B12 deficiency neuropathy 06/05/2021   Type 2 diabetes mellitus with both eyes affected by mild nonproliferative retinopathy without macular edema, with long-term current use of insulin 05/05/2021   Type 2 diabetes mellitus with proteinuria 05/05/2021   Chronic bilateral low back pain without sciatica 06/27/2019   Anxiety 07/24/2018   Mild persistent asthma without complication 07/24/2018   Type 2 diabetes mellitus with diabetic polyneuropathy, with long-term current use of insulin 07/24/2018   Vitamin D deficiency 07/24/2018   IUD (intrauterine device) in place 06/10/2015   Uterine leiomyoma 06/10/2015   Obstructive sleep apnea of adult 08/20/2014   Morbid obesity 11/26/2007   Allergic rhinitis, seasonal 09/04/2007   Migraine without aura and responsive to treatment 05/01/2007   Gastro-esophageal reflux disease without esophagitis 09/04/2006   Hypertension associated with diabetes 09/04/2006   Hyperlipidemia associated with type 2 diabetes mellitus 09/04/2006    PCP: Marjie Skiff, NP   REFERRING PROVIDER: Marjie Skiff, NP   REFERRING DIAG: chronic bilateral low back pain without sciatica  Rationale for Evaluation  and Treatment: Rehabilitation  THERAPY DIAG:  No diagnosis found.  ONSET DATE: chronic  SUBJECTIVE:                                                                                                                                                                                           SUBJECTIVE STATEMENT: ***  PERTINENT HISTORY:  Patient is a 47 y.o. female who presents to outpatient physical therapy with a referral for medical diagnosis chronic bilateral low back pain without sciatica. This patient's chief complaints consist of ***, leading to the following functional deficits: ***. Relevant past medical history and comorbidities include migraine, GERD, HTN, HLD, OSA, allergic rhinitis, morbid obesity, anxiety, mild persistent asthma, type 2 diabetes mellitus with diabetic polyneuropathy with long  term use of insulin and both eyes affected by mild non-proliferative retinopathy without macular edema, heart murmer, uterine leiomyoma, .  Patient denies hx of {redflags:27294}   PAIN:  Are you having pain? Yes: NPRS scale: Current: ***/10,  Best: ***/10, Worst: ***/10. Pain location: *** Pain description: *** Aggravating factors: *** Relieving factors: ***   FUNCTIONAL LIMITATIONS: ***  LEISURE: ***  PRECAUTIONS: {Therapy precautions:24002}  WEIGHT BEARING RESTRICTIONS: {Yes ***/No:24003}  FALLS:  Has patient fallen in last 6 months? {fallsyesno:27318}  LIVING ENVIRONMENT: Lives with: {[facility] lives with:25569::"lives with their family"} Lives in: {Lives in:25570} Stairs: {opstairs:27293} Has following equipment at home: {Assistive devices:23999}  OCCUPATION: ***  PLOF: {PLOF:24004}  PATIENT GOALS: ***  NEXT MD VISIT: ***   OBJECTIVE  DIAGNOSTIC FINDINGS:  No recent imaging  SELF- REPORTED FUNCTION FOTO score: ***/100 (lumbar spine questionnaire)  OBSERVATION/INSPECTION Posture Posture (seated): forward head, rounded shoulders, slumped in sitting.  Posture  (standing): *** Posture correction: *** Anthropometrics Tremor: none Body composition: *** Muscle bulk: *** Skin: The incision sites appear to be healing well with no excessive redness, warmth, drainage or signs of infection present.  *** Edema: *** Functional Mobility Bed mobility: *** Transfers: *** Gait: grossly WFL for household and short community ambulation. More detailed gait analysis deferred to later date as needed. *** Stairs: ***  SPINE MOTION  LUMBAR SPINE AROM *Indicates pain Flexion: *** Extension: *** Side Flexion:   R ***  L *** Rotation:  R *** L *** Side glide:  R *** L ***   NEUROLOGICAL  Upper Motor Neuron Screen Babinski, Hoffman's and Clonus (ankle) negative bilaterally.  Dermatomes C2-T1 appears equal and intact to light touch except the following: *** L2-S2 appears equal and intact to light touch except the following: *** Deep Tendon Reflexes R/L  ***+/***+ Biceps brachii reflex (C5, C6) ***+/***+ Brachioradialis reflex (C6) ***+/***+ Triceps brachii reflex (C7) ***+/***+ Quadriceps reflex (L4) ***+/***+ Achilles reflex (S1)  SPINE MOTION  CERVICAL SPINE AROM *Indicates pain Flexion: *** Extension: *** Side Flexion:   R ***  L *** Rotation:  R *** L ***   PERIPHERAL JOINT MOTION (in degrees)  ACTIVE RANGE OF MOTION (AROM) *Indicates pain Date Date Date  Joint/Motion R/L R/L R/L  Shoulder     Flexion / / /  Extension / / /  Abduction  / / /  External rotation / / /  Internal rotation / / /  Elbow     Flexion  / / /  Extension  / / /  Wrist     Flexion / / /  Extension  / / /  Radial deviation / / /  Ulnar deviation / / /  Pronation / / /  Supination / / /  Hip     Flexion / / /  Extension  / / /  Abduction / / /  Adduction / / /  External rotation / / /  Internal rotation  / / /  Knee     Extension / / /  Flexoin / / /  Ankle/Foot     Dorsiflexion (knee ext) / / /  Dorsiflexion (knee flex) / / /   Plantarflexion / / /  Everison / / /  Inversion / / /  Great toe extension / / /  Great toe flexion / / /  Comments:   PASSIVE RANGE OF MOTION (PROM) *Indicates pain Date Date Date  Joint/Motion R/L R/L R/L  Shoulder     Flexion / / /  Extension / / /  Abduction  / / /  External rotation / / /  Internal rotation / / /  Elbow     Flexion  / / /  Extension  / / /  Wrist     Flexion / / /  Extension  / / /  Radial deviation / / /  Ulnar deviation / / /  Pronation / / /  Supination / / /  Hip     Flexion  / / /  Extension  / / /  Abduction / / /  Adduction / / /  External rotation / / /  Internal rotation  / / /  Knee     Extension / / /  Flexion / / /  Ankle/Foot     Dorsiflexion (knee ext) / / /  Dorsiflexion (knee flex) / / /  Plantarflexion / / /  Everison / / /  Inversion / / /  Great toe extension / / /  Great toe flexion / / /  Comments:   MUSCLE PERFORMANCE (MMT):  *Indicates pain Date Date Date  Joint/Motion R/L R/L R/L  Shoulder     Flexion / / /  Abduction (C5) / / /  External rotation / / /  Internal rotation / / /  Extension / / /  Elbow     Flexion (C6) / / /  Extension (C7) / / /  Wrist     Flexion (C7) / / /  Extension (C6) / / /  Radial deviation / / /  Ulnar deviation (C8) / / /  Pronation / / /  Supination / / /  Hand     Thumb extension (C8) / / /  Finger abduction (T1) / / /  Grip (C8) / / /  Hip     Flexion (L1, L2) / / /  Extension (knee ext) / / /  Extension (knee flex) / / /  Abduction / / /  Adduction / / /  External rotation / / /  Internal rotation  / / /  Knee     Extension (L3) / / /  Flexion (S2) / / /  Ankle/Foot     Dorsiflexion (L4) / / /  Great toe extension (L5) / / /  Eversion (S1) / / /  Plantarflexion (S1) / / /  Inversion / / /  Pronation / / /  Great toe flexion / / /  Comments:   SPECIAL  TESTS:  .Neurodynamictests .NeurodynamicUE .NeurodynamicLE .CspineInstability .CSPINESPECIALTESTS .SHOULDERSPECIALTESTCLUSTERS .HIPSPECIALTESTS .SIJSPECIALTESTS   SHOULDER SPECIAL TESTS RTC, Impingement, Anterior Instability (macrotrauma), Labral Tear: Painful arc test: R = ***, L = ***. Drop arm test: R = ***, L = ***. Hawkins-Kennedy test: R = ***, L = ***. Infraspinatus test: R = ***, L = ***. Apprehension test: R = ***, L = ***. Relocation test: R = ***, L = ***. Active compression test: R = ***, L = ***.  ACCESSORY MOTION: ***  PALPATION: ***  SUSTAINED POSITIONS TESTING:  ***  REPEATED MOTIONS TESTING: ***  FUNCTIONAL/BALANCE TESTS: Five Time Sit to Stand (5TSTS): *** seconds Functional Gait Assessment (FGA): ***/30 (see details above) Ten meter walking trial ( ): *** m/s Six Minute Walk Test ( ): *** feet Timed Up and Go (TUG): *** seconds   Dynamic Gait Index: ***/24 BERG Balance Scale: ***/56 Tinetti/POMA: ***/28 Timed Up and GO: *** seconds (average of 3 trials) Trial 1: *** Trial 2: ***  Trial 3: *** Romberg test: -Narrow stance, eyes open: *** seconds -Narrow stance, eyes closed: *** seconds Sharpened Romberg test: -Tandem stance, eyes open: *** seconds -Tandem stance, eyes closed: *** seconds  Narrow stance, firm surface, eyes open: *** seconds Narrow stance, firm surface, eyes closed: *** seconds Narrow stance, compliant surface, eyes open: *** seconds Narrow stance, compliant surface, eyes closed: *** seconds Single leg stance, firm surface, eyes open: R= *** seconds, L= *** seconds Single leg stance, compliant surface, eyes open: R= *** seconds, L= *** seconds Gait speed: *** m/s Functional reach test: *** inches      TODAY'S TREATMENT:    PATIENT EDUCATION:  Education details: *** Person educated: {Person educated:25204} Education method: {Education Method:25205} Education comprehension: {Education  Comprehension:25206}  HOME EXERCISE PROGRAM: ***  ASSESSMENT:  CLINICAL IMPRESSION: Patient is a 47 y.o. female referred to outpatient physical therapy with a medical diagnosis of chronic bilateral low back pain without sciatica who presents with signs and symptoms consistent with ***. Patient presents with significant *** impairments that are limiting ability to complete *** without difficulty. Patient will benefit from skilled physical therapy intervention to address current body structure impairments and activity limitations to improve function and work towards goals set in current POC in order to return to prior level of function or maximal functional improvement.   OBJECTIVE IMPAIRMENTS: {opptimpairments:25111}.   ACTIVITY LIMITATIONS: {activitylimitations:27494}  PARTICIPATION LIMITATIONS: {participationrestrictions:25113}  PERSONAL FACTORS: {Personal factors:25162} are also affecting patient's functional outcome.   REHAB POTENTIAL: {rehabpotential:25112}  CLINICAL DECISION MAKING: {clinical decision making:25114}  EVALUATION COMPLEXITY: {Evaluation complexity:25115}   GOALS: Goals reviewed with patient? No  SHORT TERM GOALS: Target date: 07/27/2022  Patient will be independent with initial home exercise program for self-management of symptoms. Baseline: {HEPbaseline4:27310} (07/13/22); Goal status: INITIAL   LONG TERM GOALS: Target date: 10/05/2022  Patient will be independent with a long-term home exercise program for self-management of symptoms.  Baseline: {HEPbaseline4:27310} (07/13/22); Goal status: INITIAL  2.  Patient will demonstrate improved FOTO to equal or greater than *** by visit #*** to demonstrate improvement in overall condition and self-reported functional ability.  Baseline: *** (07/13/22); Goal status: INITIAL  3.  *** Baseline: *** (07/13/22); Goal status: INITIAL  4.  *** Baseline: *** (07/13/22); Goal status: INITIAL  5.  Patient will  complete community, work and/or recreational activities without limitation due to current condition.  Baseline: *** (07/13/22); Goal status: INITIAL  6.  *** Baseline: *** Goal status: INITIAL   PLAN:  PT FREQUENCY: 1-2x/week  PT DURATION: 12 weeks  PLANNED INTERVENTIONS: {rehab planned interventions:25118::"Therapeutic exercises","Therapeutic activity","Neuromuscular re-education","Balance training","Gait training","Patient/Family education","Self Care","Joint mobilization"}.  PLAN FOR NEXT SESSION: Progressive core/LE/functional strengthening and ROM exercises. Manual therapy and dry needling as needed and appropriate. Education.    Cira Rue, PT, DPT 07/13/2022, 11:28 AM  [facility] [address] [place], [place] [address] P: [phone] I F: [phone]

## 2022-07-26 ENCOUNTER — Ambulatory Visit: Payer: Medicaid Other | Admitting: Physical Therapy

## 2022-08-01 ENCOUNTER — Encounter: Payer: BLUE CROSS/BLUE SHIELD | Admitting: Physical Therapy

## 2022-08-03 ENCOUNTER — Encounter: Payer: BLUE CROSS/BLUE SHIELD | Admitting: Physical Therapy

## 2022-08-04 ENCOUNTER — Other Ambulatory Visit: Payer: Self-pay | Admitting: Nurse Practitioner

## 2022-08-04 NOTE — Telephone Encounter (Signed)
The original prescription was discontinued on 06/10/2021 by Marjie Skiff, NP   Requested Prescriptions  Pending Prescriptions Disp Refills   TRULICITY 1.5 MG/0.5ML SOPN [Pharmacy Med Name: Trulicity 1.5 MG/0.5ML Subcutaneous Solution Pen-injector] 12 mL 0    Sig: INJECT 1.5MG  INTO THE SKIN ONCE A WEEK     Endocrinology:  Diabetes - GLP-1 Receptor Agonists Passed - 08/04/2022  4:14 PM      Passed - HBA1C is between 0 and 7.9 and within 180 days    HB A1C (BAYER DCA - WAIVED)  Date Value Ref Range Status  06/26/2022 7.1 (H) 4.8 - 5.6 % Final    Comment:             Prediabetes: 5.7 - 6.4          Diabetes: >6.4          Glycemic control for adults with diabetes: <7.0          Passed - Valid encounter within last 6 months    Recent Outpatient Visits           1 month ago Type 2 diabetes mellitus with proteinuria (HCC)   [place] [facility] [place], [place] T, NP   5 months ago Type 2 diabetes mellitus with proteinuria (HCC)   [place] [facility] [place], [place] T, NP   8 months ago Type 2 diabetes mellitus with proteinuria (HCC)   [place] [facility] [place], [place] T, NP   11 months ago Type 2 diabetes mellitus with proteinuria (HCC)   [place] [facility] [place], [place] T, NP   1 year ago Type 2 diabetes mellitus with diabetic polyneuropathy, with long-term current use of insulin (HCC)   [place] [facility] [place], Dorie Rank, NP       Future Appointments             In 4 days Wyline Mood, MD [facility] [place]   In 1 month [place], Dorie Rank, NP [facility]

## 2022-08-07 NOTE — Therapy (Deleted)
OUTPATIENT PHYSICAL THERAPY EVALUATION   Patient Name: Michelle Warner MRN: [id number] DOB:May 14, 1975, 47 y.o., female Today's Date: 08/07/2022  END OF SESSION:   Past Medical History:  Diagnosis Date   Anxiety    Appendicitis    Depression    Diabetes mellitus without complication (HCC)    Heart murmur    Hypercholesterolemia    Hypertension    Past Surgical History:  Procedure Laterality Date   APPENDECTOMY     CESAREAN SECTION     LAPAROSCOPIC APPENDECTOMY N/A 12/08/2014   Procedure: APPENDECTOMY LAPAROSCOPIC;  Surgeon: Natale Lay, MD;  Location: [facility] ORS;  Service: General;  Laterality: N/A;   TONSILLECTOMY     Patient Active Problem List   Diagnosis Date Noted   Heart murmur, systolic 02/20/2022   Type O blood, Rh positive 08/07/2021   Vitamin B12 deficiency neuropathy (HCC) 06/05/2021   Type 2 diabetes mellitus with both eyes affected by mild nonproliferative retinopathy without macular edema, with long-term current use of insulin (HCC) 05/05/2021   Type 2 diabetes mellitus with proteinuria (HCC) 05/05/2021   Chronic bilateral low back pain without sciatica 06/27/2019   Anxiety 07/24/2018   Mild persistent asthma without complication 07/24/2018   Type 2 diabetes mellitus with diabetic polyneuropathy, with long-term current use of insulin (HCC) 07/24/2018   Vitamin D deficiency 07/24/2018   IUD (intrauterine device) in place 06/10/2015   Uterine leiomyoma 06/10/2015   Obstructive sleep apnea of adult 08/20/2014   Morbid obesity (HCC) 11/26/2007   Allergic rhinitis, seasonal 09/04/2007   Migraine without aura and responsive to treatment 05/01/2007   Gastro-esophageal reflux disease without esophagitis 09/04/2006   Hypertension associated with diabetes (HCC) 09/04/2006   Hyperlipidemia associated with type 2 diabetes mellitus (HCC) 09/04/2006    PCP: Marjie Skiff, NP   REFERRING PROVIDER: Marjie Skiff, NP   REFERRING DIAG: chronic bilateral low back pain  without sciatica  Rationale for Evaluation and Treatment: Rehabilitation  THERAPY DIAG:  No diagnosis found.  ONSET DATE: chronic  SUBJECTIVE:                                                                                                                                                                                           SUBJECTIVE STATEMENT: ***  PERTINENT HISTORY:  Patient is a 47 y.o. female who presents to outpatient physical therapy with a referral for medical diagnosis chronic bilateral low back pain without sciatica. This patient's chief complaints consist of ***, leading to the following functional deficits: ***. Relevant past medical history and comorbidities include migraine, GERD, HTN, HLD, OSA, allergic rhinitis, morbid obesity, anxiety, mild persistent asthma, type  2 diabetes mellitus with diabetic polyneuropathy with long term use of insulin and both eyes affected by mild non-proliferative retinopathy without macular edema, heart murmer, uterine leiomyoma, .  Patient denies hx of {redflags:27294}   PAIN:  Are you having pain? Yes: NPRS scale: Current: ***/10,  Best: ***/10, Worst: ***/10. Pain location: *** Pain description: *** Aggravating factors: *** Relieving factors: ***   FUNCTIONAL LIMITATIONS: ***  LEISURE: ***  PRECAUTIONS: {Therapy precautions:24002}  WEIGHT BEARING RESTRICTIONS: {Yes ***/No:24003}  FALLS:  Has patient fallen in last 6 months? {fallsyesno:27318}  LIVING ENVIRONMENT: Lives with: {OPRC lives with:25569::"lives with their family"} Lives in: {Lives in:25570} Stairs: {opstairs:27293} Has following equipment at home: {Assistive devices:23999}  OCCUPATION: ***  PLOF: {PLOF:24004}  PATIENT GOALS: ***  NEXT MD VISIT: ***   OBJECTIVE  DIAGNOSTIC FINDINGS:  No recent imaging  SELF- REPORTED FUNCTION FOTO score: ***/100 (lumbar spine questionnaire)  OBSERVATION/INSPECTION Posture Posture (seated): forward head, rounded  shoulders, slumped in sitting.  Posture (standing): *** Posture correction: *** Anthropometrics Tremor: none Body composition: *** Muscle bulk: *** Skin: The incision sites appear to be healing well with no excessive redness, warmth, drainage or signs of infection present.  *** Edema: *** Functional Mobility Bed mobility: *** Transfers: *** Gait: grossly WFL for household and short community ambulation. More detailed gait analysis deferred to later date as needed. *** Stairs: ***  SPINE MOTION  LUMBAR SPINE AROM *Indicates pain Flexion: *** Extension: *** Side Flexion:   R ***  L *** Rotation:  R *** L *** Side glide:  R *** L ***   NEUROLOGICAL  Upper Motor Neuron Screen Babinski, Hoffman's and Clonus (ankle) negative bilaterally.  Dermatomes C2-T1 appears equal and intact to light touch except the following: *** L2-S2 appears equal and intact to light touch except the following: *** Deep Tendon Reflexes R/L  ***+/***+ Biceps brachii reflex (C5, C6) ***+/***+ Brachioradialis reflex (C6) ***+/***+ Triceps brachii reflex (C7) ***+/***+ Quadriceps reflex (L4) ***+/***+ Achilles reflex (S1)  SPINE MOTION  CERVICAL SPINE AROM *Indicates pain Flexion: *** Extension: *** Side Flexion:   R ***  L *** Rotation:  R *** L ***   PERIPHERAL JOINT MOTION (in degrees)  ACTIVE RANGE OF MOTION (AROM) *Indicates pain Date Date Date  Joint/Motion R/L R/L R/L  Shoulder     Flexion / / /  Extension / / /  Abduction  / / /  External rotation / / /  Internal rotation / / /  Elbow     Flexion  / / /  Extension  / / /  Wrist     Flexion / / /  Extension  / / /  Radial deviation / / /  Ulnar deviation / / /  Pronation / / /  Supination / / /  Hip     Flexion / / /  Extension  / / /  Abduction / / /  Adduction / / /  External rotation / / /  Internal rotation  / / /  Knee     Extension / / /  Flexoin / / /  Ankle/Foot     Dorsiflexion (knee ext) / /  /  Dorsiflexion (knee flex) / / /  Plantarflexion / / /  Everison / / /  Inversion / / /  Great toe extension / / /  Great toe flexion / / /  Comments:   PASSIVE RANGE OF MOTION (PROM) *Indicates pain Date Date Date  Joint/Motion R/L R/L R/L  Shoulder     Flexion / / /  Extension / / /  Abduction  / / /  External rotation / / /  Internal rotation / / /  Elbow     Flexion  / / /  Extension  / / /  Wrist     Flexion / / /  Extension  / / /  Radial deviation / / /  Ulnar deviation / / /  Pronation / / /  Supination / / /  Hip     Flexion  / / /  Extension  / / /  Abduction / / /  Adduction / / /  External rotation / / /  Internal rotation  / / /  Knee     Extension / / /  Flexion / / /  Ankle/Foot     Dorsiflexion (knee ext) / / /  Dorsiflexion (knee flex) / / /  Plantarflexion / / /  Everison / / /  Inversion / / /  Great toe extension / / /  Great toe flexion / / /  Comments:   MUSCLE PERFORMANCE (MMT):  *Indicates pain Date Date Date  Joint/Motion R/L R/L R/L  Shoulder     Flexion / / /  Abduction (C5) / / /  External rotation / / /  Internal rotation / / /  Extension / / /  Elbow     Flexion (C6) / / /  Extension (C7) / / /  Wrist     Flexion (C7) / / /  Extension (C6) / / /  Radial deviation / / /  Ulnar deviation (C8) / / /  Pronation / / /  Supination / / /  Hand     Thumb extension (C8) / / /  Finger abduction (T1) / / /  Grip (C8) / / /  Hip     Flexion (L1, L2) / / /  Extension (knee ext) / / /  Extension (knee flex) / / /  Abduction / / /  Adduction / / /  External rotation / / /  Internal rotation  / / /  Knee     Extension (L3) / / /  Flexion (S2) / / /  Ankle/Foot     Dorsiflexion (L4) / / /  Great toe extension (L5) / / /  Eversion (S1) / / /  Plantarflexion (S1) / / /  Inversion / / /  Pronation / / /  Great toe flexion / / /  Comments:   SPECIAL  TESTS:  .Neurodynamictests .NeurodynamicUE .NeurodynamicLE .CspineInstability .CSPINESPECIALTESTS .SHOULDERSPECIALTESTCLUSTERS .HIPSPECIALTESTS .SIJSPECIALTESTS   SHOULDER SPECIAL TESTS RTC, Impingement, Anterior Instability (macrotrauma), Labral Tear: Painful arc test: R = ***, L = ***. Drop arm test: R = ***, L = ***. Hawkins-Kennedy test: R = ***, L = ***. Infraspinatus test: R = ***, L = ***. Apprehension test: R = ***, L = ***. Relocation test: R = ***, L = ***. Active compression test: R = ***, L = ***.  ACCESSORY MOTION: ***  PALPATION: ***  SUSTAINED POSITIONS TESTING:  ***  REPEATED MOTIONS TESTING: ***  FUNCTIONAL/BALANCE TESTS: Five Time Sit to Stand (5TSTS): *** seconds Functional Gait Assessment (FGA): ***/30 (see details above) Ten meter walking trial ( ): *** m/s Six Minute Walk Test ( ): *** feet Timed Up and Go (TUG): *** seconds   Dynamic Gait Index: ***/24 BERG Balance Scale: ***/56 Tinetti/POMA: ***/28 Timed Up and GO: *** seconds (  average of 3 trials) Trial 1: *** Trial 2: *** Trial 3: *** Romberg test: -Narrow stance, eyes open: *** seconds -Narrow stance, eyes closed: *** seconds Sharpened Romberg test: -Tandem stance, eyes open: *** seconds -Tandem stance, eyes closed: *** seconds  Narrow stance, firm surface, eyes open: *** seconds Narrow stance, firm surface, eyes closed: *** seconds Narrow stance, compliant surface, eyes open: *** seconds Narrow stance, compliant surface, eyes closed: *** seconds Single leg stance, firm surface, eyes open: R= *** seconds, L= *** seconds Single leg stance, compliant surface, eyes open: R= *** seconds, L= *** seconds Gait speed: *** m/s Functional reach test: *** inches      TODAY'S TREATMENT:    PATIENT EDUCATION:  Education details: *** Person educated: {Person educated:25204} Education method: {Education Method:25205} Education comprehension: {Education  Comprehension:25206}  HOME EXERCISE PROGRAM: ***  ASSESSMENT:  CLINICAL IMPRESSION: Patient is a 47 y.o. female referred to outpatient physical therapy with a medical diagnosis of chronic bilateral low back pain without sciatica who presents with signs and symptoms consistent with ***. Patient presents with significant *** impairments that are limiting ability to complete *** without difficulty. Patient will benefit from skilled physical therapy intervention to address current body structure impairments and activity limitations to improve function and work towards goals set in current POC in order to return to prior level of function or maximal functional improvement.   OBJECTIVE IMPAIRMENTS: {opptimpairments:25111}.   ACTIVITY LIMITATIONS: {activitylimitations:27494}  PARTICIPATION LIMITATIONS: {participationrestrictions:25113}  PERSONAL FACTORS: {Personal factors:25162} are also affecting patient's functional outcome.   REHAB POTENTIAL: {rehabpotential:25112}  CLINICAL DECISION MAKING: {clinical decision making:25114}  EVALUATION COMPLEXITY: {Evaluation complexity:25115}   GOALS: Goals reviewed with patient? No  SHORT TERM GOALS: Target date: 08/21/2022  Patient will be independent with initial home exercise program for self-management of symptoms. Baseline: {HEPbaseline4:27310} (08/07/22); Goal status: INITIAL   LONG TERM GOALS: Target date: 10/30/2022  Patient will be independent with a long-term home exercise program for self-management of symptoms.  Baseline: {HEPbaseline4:27310} (08/07/22); Goal status: INITIAL  2.  Patient will demonstrate improved FOTO to equal or greater than *** by visit #*** to demonstrate improvement in overall condition and self-reported functional ability.  Baseline: *** (08/07/22); Goal status: INITIAL  3.  *** Baseline: *** (08/07/22); Goal status: INITIAL  4.  *** Baseline: *** (08/07/22); Goal status: INITIAL  5.  Patient will  complete community, work and/or recreational activities without limitation due to current condition.  Baseline: *** (08/07/22); Goal status: INITIAL  6.  *** Baseline: *** Goal status: INITIAL   PLAN:  PT FREQUENCY: 1-2x/week  PT DURATION: 12 weeks  PLANNED INTERVENTIONS: {rehab planned interventions:25118::"Therapeutic exercises","Therapeutic activity","Neuromuscular re-education","Balance training","Gait training","Patient/Family education","Self Care","Joint mobilization"}.  PLAN FOR NEXT SESSION: Progressive core/LE/functional strengthening and ROM exercises. Manual therapy and dry needling as needed and appropriate. Education.    Cira Rue, PT, DPT 08/07/2022, 3:19 PM  [facility] [address] [place], [place] [address] P: [phone] I F: [phone]

## 2022-08-08 ENCOUNTER — Ambulatory Visit: Payer: BLUE CROSS/BLUE SHIELD | Admitting: Gastroenterology

## 2022-08-08 ENCOUNTER — Encounter: Payer: BLUE CROSS/BLUE SHIELD | Admitting: Physical Therapy

## 2022-08-08 NOTE — Progress Notes (Deleted)
Wyline Mood MD, MRCP(U.K) [address]  [address]  [place], [place] [address]  Main: [phone]  Fax: [phone]   Primary Care Physician: Marjie Skiff, NP  Primary Gastroenterologist:  Dr. Wyline Mood   No chief complaint on file.   HPI: Michelle Warner is a 47 y.o. female   Summary of history :  She was initially seen by me back in 11/2020 for loose stools . Was recommended a colonoscopy which she didn't pursue . H pylori breath test and celiac serology were negative  06/26/2022: Hb 14 grams, CMP-normal .   Today here to see me for GERD.   ***   Current Outpatient Medications  Medication Sig Dispense Refill   acetaminophen (TYLENOL) 500 MG tablet Take 1,000 mg by mouth at bedtime. For back, leg and knee pain     albuterol (PROVENTIL HFA;VENTOLIN HFA) 108 (90 Base) MCG/ACT inhaler Inhale 1-2 puffs into the lungs every 6 (six) hours as needed for wheezing or shortness of breath. 1 Inhaler 3   Blood Glucose Monitoring Suppl (FIFTY50 GLUCOSE METER 2.0) w/Device KIT Disp. blood glucose meter kit preferred by patient's insurance. Check blood sugars as directed by provider. Dx: Diabetes, E11.9 1 kit 2   cephALEXin (KEFLEX) 500 MG capsule Take 500 mg by mouth 2 (two) times daily.     citalopram (CELEXA) 20 MG tablet Take 1 tablet by mouth daily. (Failure to attend appt on 07/13/20 will result in titrating off the medication) 90 tablet 4   cyanocobalamin (VITAMIN B12) 1000 MCG tablet Take 1 tablet (1,000 mcg total) by mouth daily. 90 tablet 4   dapagliflozin propanediol (FARXIGA) 10 MG TABS tablet Take 1 tablet (10 mg total) by mouth daily before breakfast. 90 tablet 4   FLOVENT HFA 110 MCG/ACT inhaler Inhale 2 puffs by mouth twice daily 36 g 4   fluticasone (FLONASE) 50 MCG/ACT nasal spray Place 2 sprays into both nostrils daily. 16 g 0   gabapentin (NEURONTIN) 300 MG capsule Take 1 capsule (300 mg total) by mouth 3 (three) times daily. 270 capsule 4   gentamicin ointment  (GARAMYCIN) 0.1 % Apply 1 Application topically 3 (three) times daily. To wound on back of left heel. 30 g 0   glipiZIDE (GLUCOTROL) 10 MG tablet TAKE ONE TABLET BY MOUTH 2 TIMES A DAY BEFORE A MEAL 180 tablet 4   glucose blood ([employer] BLOOD GLUCOSE TEST) test strip Disp test strips preferred by insurance plan. Testing twice daily. Dx:E11.9,Z79.4 100 strip 11   ibuprofen (ADVIL,MOTRIN) 600 MG tablet Take 1 tablet (600 mg total) by mouth every 8 (eight) hours as needed. 15 tablet 0   insulin glargine-yfgn (SEMGLEE) 100 UNIT/ML Pen Inject 30 Units into the skin at bedtime. 9 mL 4   Insulin Pen Needle (FIFTY50 PEN NEEDLES) 31G X 8 MM MISC Injection Frequency is 1 time per day; Dx Code: Type 2 Diabetes uncontrolled (E11.65) 100 each 11   lidocaine (LIDODERM) 5 % Place 1 patch onto the skin daily. Remove & Discard patch within 12 hours or as directed by MD 30 patch 0   lisinopril (ZESTRIL) 20 MG tablet Take 1 tablet (20 mg total) by mouth daily. 90 tablet 4   loratadine (CLARITIN) 10 MG tablet Take 1 tablet (10 mg total) by mouth daily. 90 tablet 4   methocarbamol (ROBAXIN) 500 MG tablet Take by mouth.     omeprazole (PRILOSEC) 20 MG capsule Take 1 capsule (20 mg total) by mouth 2 (two) times  daily. 180 capsule 4   rosuvastatin (CRESTOR) 20 MG tablet Take 1 tablet (20 mg total) by mouth daily. 90 tablet 4   traZODone (DESYREL) 100 MG tablet Take 1.5 tablets (150 mg total) by mouth at bedtime. 135 tablet 4   TRULICITY 3 MG/0.5ML SOPN Inject 3 mg into the skin once a week.     Vitamin D, Ergocalciferol, (DRISDOL) 1.25 MG (50000 UNIT) CAPS capsule Take 1 capsule (50,000 Units total) by mouth every 7 (seven) days. 12 capsule 4   No current facility-administered medications for this visit.    Allergies as of 08/08/2022 - Review Complete 06/26/2022  Allergen Reaction Noted   Egg solids, whole Nausea And Vomiting 06/22/2022   Metformin Diarrhea 06/27/2019       Interval history   ***/***/202*    ***/***/2024   ROS:  General: Negative for anorexia, weight loss, fever, chills, fatigue, weakness. ENT: Negative for hoarseness, difficulty swallowing , nasal congestion. CV: Negative for chest pain, angina, palpitations, dyspnea on exertion, peripheral edema.  Respiratory: Negative for dyspnea at rest, dyspnea on exertion, cough, sputum, wheezing.  GI: See history of present illness. GU:  Negative for dysuria, hematuria, urinary incontinence, urinary frequency, nocturnal urination.  Endo: Negative for unusual weight change.    Physical Examination:   There were no vitals taken for this visit.  General: Well-nourished, well-developed in no acute distress.  Eyes: No icterus. Conjunctivae pink. Mouth: Oropharyngeal mucosa moist and pink , no lesions erythema or exudate. Lungs: Clear to auscultation bilaterally. Non-labored. Heart: Regular rate and rhythm, no murmurs rubs or gallops.  Abdomen: Bowel sounds are normal, nontender, nondistended, no hepatosplenomegaly or masses, no abdominal bruits or hernia , no rebound or guarding.   Extremities: No lower extremity edema. No clubbing or deformities. Neuro: Alert and oriented x 3.  Grossly intact. Skin: Warm and dry, no jaundice.   Psych: Alert and cooperative, normal mood and affect.   Imaging Studies: No results found.  Assessment and Plan:   Michelle Warner is a 47 y.o. y/o female here to see me for GERD.    Plan  Colonoscopy     Dr Wyline Mood  MD,MRCP [facility]) Follow up in ***  BP check ***

## 2022-08-09 ENCOUNTER — Ambulatory Visit: Payer: Medicaid Other | Attending: Nurse Practitioner | Admitting: Physical Therapy

## 2022-08-09 ENCOUNTER — Telehealth: Payer: Self-pay | Admitting: Nurse Practitioner

## 2022-08-09 MED ORDER — TRULICITY 1.5 MG/0.5ML ~~LOC~~ SOAJ: 1.5000 mg | SUBCUTANEOUS | 2 refills | Status: DC

## 2022-08-09 NOTE — Telephone Encounter (Signed)
Pt states that her pharmacy does not have her dosage for her medication: TRULICITY 3 MG/0.5ML SOPN    The pharmacy states that they have the medication in 1.5 dosage and is wanting to see if the PCP can send over a new prescription for the pt. Pt is out of medication. The pharmacy will need a quick response due to them running out of the medication.   Please advise.    [facility] [address] ([address]), [place] - [address]  Phone: [phone] Fax: [phone]

## 2022-08-10 ENCOUNTER — Encounter: Payer: BLUE CROSS/BLUE SHIELD | Admitting: Physical Therapy

## 2022-08-11 ENCOUNTER — Telehealth: Payer: Self-pay | Admitting: Nurse Practitioner

## 2022-08-11 NOTE — Telephone Encounter (Unsigned)
Copied from CRM [id number]. Topic: General - Other >> Aug 11, 2022 12:37 PM Michelle Warner wrote: Reason for CRM: Pt reports that the Rx for Trulicity needs prior authorization. Cb# [phone]

## 2022-08-15 ENCOUNTER — Telehealth: Payer: Self-pay | Admitting: Nurse Practitioner

## 2022-08-15 ENCOUNTER — Ambulatory Visit: Payer: Self-pay | Admitting: *Deleted

## 2022-08-15 NOTE — Telephone Encounter (Signed)
Pt is calling to report that she is having a hard time finding Dulaglutide (TRULICITY) 1.5 MG/0.5ML SOPN [[id number]] The pharmacy does not have her strength. And needing a PA on the medication. CB- [phone]

## 2022-08-15 NOTE — Telephone Encounter (Signed)
  Chief Complaint: Glucose over 100 this morning.   Does Michelle Warner want me to take the extra once a week dose?     Also she can't get the Trulicity anywhere.   Can Michelle Warner prescribe something else?  Not had Trulicity in 2 weeks. Symptoms: None Frequency: N/A Pertinent Negatives: Patient denies N/A Disposition: [] ED /[] Urgent Care (no appt availability in office) / [] Appointment(In office/virtual)/ []  [place] [facility]/ [] Home Care/ [] Refused Recommended Disposition /[] [facility] [facility]/ [x]  Follow-up with PCP Additional Notes: Message sent to Aura Dials, NP.

## 2022-08-15 NOTE — Telephone Encounter (Signed)
Message from Valora Piccolo sent at 08/15/2022 12:00 PM EDT    Reason for Disposition  [1] Caller has URGENT medicine question about med that PCP or specialist prescribed AND [2] triager unable to answer question  Answer Assessment - Initial Assessment Questions 1. NAME of MEDICINE: "What medicine(s) are you calling about?"     My blood sugar runs 89-98 in the mornings when I get up.    It's over 100 this morning before eating.     I usually take my insulin at night.     2. QUESTION: "What is your question?" (e.g., double dose of medicine, side effect)     Does Jolene want me to take an extra dose of the once a week extra shot?  I can't get the Trulicity.   I haven't taken it in 2 weeks.    Can she prescribe something else for me?  3. PRESCRIBER: "Who prescribed the medicine?" Reason: if prescribed by specialist, call should be referred to that group.     Aura Dials, NP 4. SYMPTOMS: "Do you have any symptoms?" If Yes, ask: "What symptoms are you having?"  "How bad are the symptoms (e.g., mild, moderate, severe)     No 5. PREGNANCY:  "Is there any chance that you are pregnant?" "When was your last menstrual period?"     Not asked  Protocols used: Medication Question Call-A-AH

## 2022-08-16 ENCOUNTER — Ambulatory Visit: Payer: Medicaid Other | Admitting: Physical Therapy

## 2022-08-16 ENCOUNTER — Encounter: Payer: BLUE CROSS/BLUE SHIELD | Admitting: Physical Therapy

## 2022-08-17 MED ORDER — SEMAGLUTIDE (1 MG/DOSE) 4 MG/3ML ~~LOC~~ SOPN
1.0000 mg | PEN_INJECTOR | SUBCUTANEOUS | 4 refills | Status: DC
Start: 2022-08-17 — End: 2022-09-25

## 2022-08-17 NOTE — Telephone Encounter (Signed)
Spoke with patient about her medication that she is currently taking for her diabetes since she has been unable to get her Trulicity in 3 weeks, She said that her blood sugar this morning has been over 130, it was 145 this morning. She wanted to know if she should be taking her Semglee twice a day instead of just at night. I did inform her that Jolene did say that if she is unable to get Trulicity that she would send in Ozempic. Please advise

## 2022-08-17 NOTE — Telephone Encounter (Signed)
Patient made aware of Provider's recommendations and verbalized understanding.   

## 2022-08-17 NOTE — Telephone Encounter (Signed)
See other patient message.

## 2022-08-17 NOTE — Telephone Encounter (Signed)
See other patient message concerning this

## 2022-08-17 NOTE — Addendum Note (Signed)
Addended by: Aura Dials T on: 08/17/2022 02:47 PM   Modules accepted: Orders

## 2022-08-21 ENCOUNTER — Encounter: Payer: BLUE CROSS/BLUE SHIELD | Admitting: Physical Therapy

## 2022-08-22 ENCOUNTER — Other Ambulatory Visit: Payer: Self-pay | Admitting: Nurse Practitioner

## 2022-08-22 MED ORDER — QVAR REDIHALER 40 MCG/ACT IN AERB
2.0000 | INHALATION_SPRAY | Freq: Two times a day (BID) | RESPIRATORY_TRACT | 0 refills | Status: AC
Start: 2022-08-22 — End: ?

## 2022-08-22 NOTE — Telephone Encounter (Signed)
Copied from CRM [id number]. Topic: General - Other >> Aug 22, 2022 10:59 AM Macon Large wrote: Reason for CRM: Pt reports that she was informed by her pharmacy that the Rx for albuterol (PROVENTIL HFA;VENTOLIN HFA) 108 (90 Base) MCG/ACT inhaler is not covered by her insurance but Qvar is covered. Pt requests that a Rx for Qvar be sent to [id number] [place] ([place]), [place] - [address]

## 2022-08-23 ENCOUNTER — Encounter: Payer: BLUE CROSS/BLUE SHIELD | Admitting: Physical Therapy

## 2022-08-28 ENCOUNTER — Encounter: Payer: BLUE CROSS/BLUE SHIELD | Admitting: Physical Therapy

## 2022-08-29 ENCOUNTER — Encounter: Payer: BLUE CROSS/BLUE SHIELD | Admitting: Physical Therapy

## 2022-08-31 ENCOUNTER — Encounter: Payer: BLUE CROSS/BLUE SHIELD | Admitting: Physical Therapy

## 2022-09-04 ENCOUNTER — Encounter: Payer: BLUE CROSS/BLUE SHIELD | Admitting: Physical Therapy

## 2022-09-05 ENCOUNTER — Other Ambulatory Visit: Payer: Self-pay | Admitting: Nurse Practitioner

## 2022-09-05 ENCOUNTER — Encounter: Payer: BLUE CROSS/BLUE SHIELD | Admitting: Physical Therapy

## 2022-09-05 NOTE — Telephone Encounter (Signed)
Dc'd 05/12/22 by PCP 05/12/22 "not covered by insurance"  Requested Prescriptions  Refused Prescriptions Disp Refills   LANTUS SOLOSTAR 100 UNIT/ML Solostar Pen [Pharmacy Med Name: Lantus SoloStar 100 UNIT/ML Subcutaneous Solution Pen-injector] 15 mL 0    Sig: INJECT 30 UNITS SUBCUTANEOUSLY AT BEDTIME     Endocrinology:  Diabetes - Insulins Passed - 09/05/2022 12:28 PM      Passed - HBA1C is between 0 and 7.9 and within 180 days    HB A1C (BAYER DCA - WAIVED)  Date Value Ref Range Status  06/26/2022 7.1 (H) 4.8 - 5.6 % Final    Comment:             Prediabetes: 5.7 - 6.4          Diabetes: >6.4          Glycemic control for adults with diabetes: <7.0          Passed - Valid encounter within last 6 months    Recent Outpatient Visits           2 months ago Type 2 diabetes mellitus with proteinuria (HCC)   [place] [facility] [place], [place] T, NP   6 months ago Type 2 diabetes mellitus with proteinuria (HCC)   [place] [facility] [place], [place] T, NP   9 months ago Type 2 diabetes mellitus with proteinuria (HCC)   [place] [facility] [place], Corrie Dandy T, NP   1 year ago Type 2 diabetes mellitus with proteinuria (HCC)   [place] [facility] [place], [place] T, NP   1 year ago Type 2 diabetes mellitus with diabetic polyneuropathy, with long-term current use of insulin (HCC)   [place] [facility] [place], Dorie Rank, NP       Future Appointments             In 2 weeks [name], Dorie Rank, NP [place] [facility]   In 1 month Wyline Mood, MD [facility]

## 2022-09-06 ENCOUNTER — Encounter: Payer: BLUE CROSS/BLUE SHIELD | Admitting: Physical Therapy

## 2022-09-07 ENCOUNTER — Encounter: Payer: BLUE CROSS/BLUE SHIELD | Admitting: Physical Therapy

## 2022-09-11 ENCOUNTER — Encounter: Payer: BLUE CROSS/BLUE SHIELD | Admitting: Physical Therapy

## 2022-09-12 ENCOUNTER — Encounter: Payer: BLUE CROSS/BLUE SHIELD | Admitting: Physical Therapy

## 2022-09-13 ENCOUNTER — Encounter: Payer: BLUE CROSS/BLUE SHIELD | Admitting: Physical Therapy

## 2022-09-14 ENCOUNTER — Encounter: Payer: BLUE CROSS/BLUE SHIELD | Admitting: Physical Therapy

## 2022-09-16 ENCOUNTER — Emergency Department
Admission: EM | Admit: 2022-09-16 | Discharge: 2022-09-16 | Disposition: A | Discharge status: Home / Self Care | Payer: BLUE CROSS/BLUE SHIELD | Attending: Emergency Medicine | Admitting: Emergency Medicine

## 2022-09-16 ENCOUNTER — Other Ambulatory Visit: Payer: Self-pay

## 2022-09-16 DIAGNOSIS — K029 Dental caries, unspecified: Secondary | ICD-10-CM | POA: Insufficient documentation

## 2022-09-16 DIAGNOSIS — K0889 Other specified disorders of teeth and supporting structures: Secondary | ICD-10-CM | POA: Diagnosis present

## 2022-09-16 MED ORDER — HYDROCODONE-ACETAMINOPHEN 5-325 MG PO TABS: 2.0000 | ORAL_TABLET | Freq: Four times a day (QID) | ORAL | 0 refills | Status: DC | PRN

## 2022-09-16 MED ORDER — AMOXICILLIN-POT CLAVULANATE 875-125 MG PO TABS: 1.0000 | ORAL_TABLET | Freq: Two times a day (BID) | ORAL | 0 refills | Status: DC

## 2022-09-16 MED ORDER — MAGIC MOUTHWASH W/LIDOCAINE: 5.0000 mL | Freq: Four times a day (QID) | ORAL | 0 refills | Status: DC | PRN

## 2022-09-16 MED ORDER — OXYCODONE-ACETAMINOPHEN 5-325 MG PO TABS
2.0000 | ORAL_TABLET | Freq: Once | ORAL | Status: AC
  Administered 2022-09-16: 2 via ORAL
  Filled 2022-09-16: qty 2

## 2022-09-16 MED ORDER — AMOXICILLIN-POT CLAVULANATE 875-125 MG PO TABS
1.0000 | ORAL_TABLET | Freq: Once | ORAL | Status: AC
  Administered 2022-09-16: 1 via ORAL
  Filled 2022-09-16: qty 1

## 2022-09-16 NOTE — ED Provider Notes (Signed)
[facility] Provider Note    Event Date/Time   First MD Initiated Contact with Patient 09/16/22 0134     (approximate)   History   Dental Pain (yesterday)   HPI Michelle Warner is a 47 y.o. female with chronic dental fractures and caries who presents for evaluation of dental pain.  She said it started yesterday and has been constant.  No real change when eating or drinking.  No swelling but she said it feels like it is extending up into her head and down into her neck.  No trouble speaking or swallowing.  It is in the back right part of her mouth, both upper and lower but mostly upper.  No fever.  She does not remember the last time she saw dentist.     Physical Exam   Triage Vital Signs: ED Triage Vitals [09/16/22 0056]  Enc Vitals Group     BP (!) 144/90     Pulse Rate 88     Resp 16     Temp 98 F (36.7 C)     Temp Source Oral     SpO2 98 %     Weight 93 kg (205 lb)     Height 1.575 m (5\' 2" )     Head Circumference      Peak Flow      Pain Score      Pain Loc      Pain Edu?      Excl. in GC?     Most recent vital signs: Vitals:   09/16/22 0056  BP: (!) 144/90  Pulse: 88  Resp: 16  Temp: 98 F (36.7 C)  SpO2: 98%    General: Awake, no distress though she appears uncomfortable. HEENT: Extensive chronic dental issues with multiple missing and broken teeth and chronic dental caries.  The area in question shows a right upper rear molar that is essentially rotten down into the gum with minimal exposed dental tissue.  This is a area that is most tender but there is no obvious periapical or other abscess.  Lower jaw rear molars are similar but not as tender nor is obvious is the upper rear right molar.  No evidence of peritonsillar abscess or other swelling.  No submandibular swelling or tenderness.  No swelling or tenderness beneath the tongue which would be suggestive of Ludwig's angina.  No pain or tenderness to anterior neck manipulation  including manipulation of the larynx.  Normal speech. CV:  Good peripheral perfusion.  Resp:  Normal effort. Speaking easily and comfortably, no accessory muscle usage nor intercostal retractions.   Abd:  No distention.    ED Results / Procedures / Treatments   Labs (all labs ordered are listed, but only abnormal results are displayed) Labs Reviewed - No data to display    PROCEDURES:  Critical Care performed: No  Procedures    IMPRESSION / MDM / ASSESSMENT AND PLAN / ED COURSE  I reviewed the triage vital signs and the nursing notes.                              Differential diagnosis includes, but is not limited to, dental caries, dental fracture, neck infection, odontogenic infection, Ludwig's angina, peritonsillar abscess, ANUG  Patient's presentation is most consistent with exacerbation of chronic illness.   Interventions/Medications given:  Medications  oxyCODONE-acetaminophen (PERCOCET/ROXICET) 5-325 MG per tablet 2 tablet (2 tablets Oral Given 09/16/22  0247)  amoxicillin-clavulanate (AUGMENTIN) 875-125 MG per tablet 1 tablet (1 tablet Oral Given 09/16/22 0247)    (Note:  hospital course my include additional interventions and/or labs/studies not listed above.)   Patient likely has a subacute infection that is gotten worse due to her chronic dental caries and multiple broken teeth.  Nothing emergent nor drainable.  No concerning prescribing habits on the [place] controlled substance database.  Administered medication as listed above and prescriptions as listed below.  I stressed to the patient the importance of following up with a dentist.  She said that she understands.  Gave usual and customary return precautions.         FINAL CLINICAL IMPRESSION(S) / ED DIAGNOSES   Final diagnoses:  Dental caries  Pain due to dental caries     Rx / DC Orders   ED Discharge Orders          Ordered    HYDROcodone-acetaminophen (NORCO/VICODIN) 5-325 MG tablet   Every 6 hours PRN        09/16/22 0232    magic mouthwash w/lidocaine SOLN  4 times daily PRN       Note to Pharmacy: Please mix viscous lidocaine 2% with magic mouthwash solution so that the lidocaine comprises approximately 25 % of the total solution.   09/16/22 0232    amoxicillin-clavulanate (AUGMENTIN) 875-125 MG tablet  2 times daily        09/16/22 1610             Note:  This document was prepared using Dragon voice recognition software and may include unintentional dictation errors.   Loleta Rose, MD 09/16/22 [phone]

## 2022-09-16 NOTE — Discharge Instructions (Addendum)
You have been seen in the Emergency Department (ED) today for dental pain.  Please take your prescribed antibiotic.  You may take pain medication as needed but ONLY as prescribed.  You should also take over-the-counter pain medication such as ibuprofen according to the label instructions unless a doctor has previously told you to avoid this type of medication (due to stomach ulcers, for example).  Alternatively you can take ibuprofen 600 mg by mouth three times daily with meals for no more than 5 days.  Please see you dentist as soon as possible; only a dentist will be able to fix your problem(s).  Please see below for dental follow up options.  Return to the ED if you develop worsening pain, fever, pus/drainage, difficulty breathing, or other symptoms that concern you.  

## 2022-09-16 NOTE — ED Triage Notes (Signed)
Pt to ed from home via POV for dental pain that started yesterday. Pt points to the right side of her mouth, lower part of her jaw. Pt is CAOx4, in no acute distress and ambulatory in triage. Pt has been taking OTC ibuprofen with no relief.

## 2022-09-18 ENCOUNTER — Encounter: Payer: BLUE CROSS/BLUE SHIELD | Admitting: Physical Therapy

## 2022-09-19 ENCOUNTER — Encounter: Payer: BLUE CROSS/BLUE SHIELD | Admitting: Physical Therapy

## 2022-09-20 ENCOUNTER — Encounter: Payer: BLUE CROSS/BLUE SHIELD | Admitting: Physical Therapy

## 2022-09-23 NOTE — Patient Instructions (Signed)
Be Involved in Your Health Care:  Taking Medications When medications are taken as directed, they can greatly improve your health. But if they are not taken as instructed, they may not work. In some cases, not taking them correctly can be harmful. To help ensure your treatment remains effective and safe, understand your medications and how to take them.  Your lab results, notes and after visit summary will be available on My Chart. We strongly encourage you to use this feature. If lab results are abnormal the clinic will contact you with the appropriate steps. If the clinic does not contact you assume the results are satisfactory. You can always see your results on My Chart. If you have questions regarding your condition, please contact the clinic during office hours. You can also ask questions on My Chart.  We at [facility] are grateful that you chose us to provide care. We strive to provide excellent and compassionate care and are always looking for feedback. If you get a survey from the clinic please complete this.   Diabetes Mellitus Basics  Diabetes mellitus, or diabetes, is a long-term (chronic) disease. It occurs when the body does not properly use sugar (glucose) that is released from food after you eat. Diabetes mellitus may be caused by one or both of these problems: Your pancreas does not make enough of a hormone called insulin. Your body does not react in a normal way to the insulin that it makes. Insulin lets glucose enter cells in your body. This gives you energy. If you have diabetes, glucose cannot get into cells. This causes high blood glucose (hyperglycemia). How to treat and manage diabetes You may need to take insulin or other diabetes medicines daily to keep your glucose in balance. If you are prescribed insulin, you will learn how to give yourself insulin by injection. You may need to adjust the amount of insulin you take based on the foods that you eat. You will  need to check your blood glucose levels using a glucose monitor as told by your health care provider. The readings can help determine if you have low or high blood glucose. Generally, you should have these blood glucose levels: Before meals (preprandial): 80-130 mg/dL (4.4-7.2 mmol/L). After meals (postprandial): below 180 mg/dL (10 mmol/L). Hemoglobin A1c (HbA1c) level: less than 7%. Your health care provider will set treatment goals for you. Keep all follow-up visits. This is important. Follow these instructions at home: Diabetes medicines Take your diabetes medicines every day as told by your health care provider. List your diabetes medicines here: Name of medicine: ______________________________ Amount (dose): _______________ Time (a.m./p.m.): _______________ Notes: ___________________________________ Name of medicine: ______________________________ Amount (dose): _______________ Time (a.m./p.m.): _______________ Notes: ___________________________________ Name of medicine: ______________________________ Amount (dose): _______________ Time (a.m./p.m.): _______________ Notes: ___________________________________ Insulin If you use insulin, list the types of insulin you use here: Insulin type: ______________________________ Amount (dose): _______________ Time (a.m./p.m.): _______________Notes: ___________________________________ Insulin type: ______________________________ Amount (dose): _______________ Time (a.m./p.m.): _______________ Notes: ___________________________________ Insulin type: ______________________________ Amount (dose): _______________ Time (a.m./p.m.): _______________ Notes: ___________________________________ Insulin type: ______________________________ Amount (dose): _______________ Time (a.m./p.m.): _______________ Notes: ___________________________________ Insulin type: ______________________________ Amount (dose): _______________ Time (a.m./p.m.): _______________  Notes: ___________________________________ Managing blood glucose  Check your blood glucose levels using a glucose monitor as told by your health care provider. Write down the times that you check your glucose levels here: Time: _______________ Notes: ___________________________________ Time: _______________ Notes: ___________________________________ Time: _______________ Notes: ___________________________________ Time: _______________ Notes: ___________________________________ Time: _______________ Notes: ___________________________________ Time: _______________ Notes: ___________________________________    Low blood glucose Low blood glucose (hypoglycemia) is when glucose is at or below 70 mg/dL (3.9 mmol/L). Symptoms may include: Feeling: Hungry. Sweaty and clammy. Irritable or easily upset. Dizzy. Sleepy. Having: A fast heartbeat. A headache. A change in your vision. Numbness around the mouth, lips, or tongue. Having trouble with: Moving (coordination). Sleeping. Treating low blood glucose To treat low blood glucose, eat or drink something containing sugar right away. If you can think clearly and swallow safely, follow the 15:15 rule: Take 15 grams of a fast-acting carb (carbohydrate), as told by your health care provider. Some fast-acting carbs are: Glucose tablets: take 3-4 tablets. Hard candy: eat 3-5 pieces. Fruit juice: drink 4 oz (120 mL). Regular (not diet) soda: drink 4-6 oz (120-180 mL). Honey or sugar: eat 1 Tbsp (15 mL). Check your blood glucose levels 15 minutes after you take the carb. If your glucose is still at or below 70 mg/dL (3.9 mmol/L), take 15 grams of a carb again. If your glucose does not go above 70 mg/dL (3.9 mmol/L) after 3 tries, get help right away. After your glucose goes back to normal, eat a meal or a snack within 1 hour. Treating very low blood glucose If your glucose is at or below 54 mg/dL (3 mmol/L), you have very low blood glucose  (severe hypoglycemia). This is an emergency. Do not wait to see if the symptoms will go away. Get medical help right away. Call your local emergency services (911 in the [place] Do not drive yourself to the hospital. Questions to ask your health care provider Should I talk with a diabetes educator? What equipment will I need to care for myself at home? What diabetes medicines do I need? When should I take them? How often do I need to check my blood glucose levels? What number can I call if I have questions? When is my follow-up visit? Where can I find a support group for people with diabetes? Where to find more information American Diabetes Association: www.diabetes.org Association of Diabetes Care and Education Specialists: www.diabeteseducator.org Contact a health care provider if: Your blood glucose is at or above 240 mg/dL (13.3 mmol/L) for 2 days in a row. You have been sick or have had a fever for 2 days or more, and you are not getting better. You have any of these problems for more than 6 hours: You cannot eat or drink. You feel nauseous. You vomit. You have diarrhea. Get help right away if: Your blood glucose is lower than 54 mg/dL (3 mmol/L). You get confused. You have trouble thinking clearly. You have trouble breathing. These symptoms may represent a serious problem that is an emergency. Do not wait to see if the symptoms will go away. Get medical help right away. Call your local emergency services (911 in the [place] Do not drive yourself to the hospital. Summary Diabetes mellitus is a chronic disease that occurs when the body does not properly use sugar (glucose) that is released from food after you eat. Take insulin and diabetes medicines as told. Check your blood glucose every day, as often as told. Keep all follow-up visits. This is important. This information is not intended to replace advice given to you by your health care provider. Make sure you discuss any  questions you have with your health care provider. Document Revised: 07/08/2019 Document Reviewed: 07/08/2019 Elsevier Patient Education  2024 [employer]  

## 2022-09-25 ENCOUNTER — Encounter: Payer: Self-pay | Admitting: Nurse Practitioner

## 2022-09-25 ENCOUNTER — Encounter: Payer: BLUE CROSS/BLUE SHIELD | Admitting: Physical Therapy

## 2022-09-25 ENCOUNTER — Ambulatory Visit (INDEPENDENT_AMBULATORY_CARE_PROVIDER_SITE_OTHER): Payer: BLUE CROSS/BLUE SHIELD | Admitting: Nurse Practitioner

## 2022-09-25 VITALS — BP 117/78 | HR 71 | Temp 97.9°F | Ht 62.09 in | Wt 201.0 lb

## 2022-09-25 DIAGNOSIS — E1159 Type 2 diabetes mellitus with other circulatory complications: Secondary | ICD-10-CM

## 2022-09-25 DIAGNOSIS — E1142 Type 2 diabetes mellitus with diabetic polyneuropathy: Secondary | ICD-10-CM | POA: Diagnosis not present

## 2022-09-25 DIAGNOSIS — K029 Dental caries, unspecified: Secondary | ICD-10-CM

## 2022-09-25 DIAGNOSIS — R809 Proteinuria, unspecified: Secondary | ICD-10-CM

## 2022-09-25 DIAGNOSIS — E113293 Type 2 diabetes mellitus with mild nonproliferative diabetic retinopathy without macular edema, bilateral: Secondary | ICD-10-CM

## 2022-09-25 DIAGNOSIS — E1129 Type 2 diabetes mellitus with other diabetic kidney complication: Secondary | ICD-10-CM | POA: Diagnosis not present

## 2022-09-25 DIAGNOSIS — G4733 Obstructive sleep apnea (adult) (pediatric): Secondary | ICD-10-CM

## 2022-09-25 DIAGNOSIS — R011 Cardiac murmur, unspecified: Secondary | ICD-10-CM

## 2022-09-25 DIAGNOSIS — E538 Deficiency of other specified B group vitamins: Secondary | ICD-10-CM

## 2022-09-25 DIAGNOSIS — E1169 Type 2 diabetes mellitus with other specified complication: Secondary | ICD-10-CM | POA: Diagnosis not present

## 2022-09-25 DIAGNOSIS — J4530 Mild persistent asthma, uncomplicated: Secondary | ICD-10-CM

## 2022-09-25 DIAGNOSIS — F419 Anxiety disorder, unspecified: Secondary | ICD-10-CM

## 2022-09-25 DIAGNOSIS — Z794 Long term (current) use of insulin: Secondary | ICD-10-CM

## 2022-09-25 LAB — BAYER DCA HB A1C WAIVED: HB A1C (BAYER DCA - WAIVED): 7.3 % — ABNORMAL HIGH (ref 4.8–5.6)

## 2022-09-25 MED ORDER — MAGIC MOUTHWASH W/LIDOCAINE: 5.0000 mL | Freq: Four times a day (QID) | ORAL | 0 refills | Status: DC | PRN

## 2022-09-25 MED ORDER — IBUPROFEN 800 MG PO TABS: 800.0000 mg | ORAL_TABLET | Freq: Three times a day (TID) | ORAL | 0 refills | Status: DC | PRN

## 2022-09-25 MED ORDER — SEMAGLUTIDE (2 MG/DOSE) 8 MG/3ML ~~LOC~~ SOPN
2.0000 mg | PEN_INJECTOR | SUBCUTANEOUS | 4 refills | Status: DC
Start: 2022-09-25 — End: 2022-12-27

## 2022-09-25 NOTE — Assessment & Plan Note (Signed)
Chronic, ongoing. Was followed by [facility] in past, but PCP currently fills prescriptions.  Will return to them if any worsening mood.  Denies SI/HI.  Continue current medication regimen which offers benefit to her.

## 2022-09-25 NOTE — Assessment & Plan Note (Signed)
Refer to diabetes with neuropathy plan of care and continue Lisinopril for kidney protection. Urine ALB 80 April 2024. 

## 2022-09-25 NOTE — Assessment & Plan Note (Signed)
Chronic, ongoing with improved A1c today 7.3%, previous 7.1%, mild trend up, suspect some related to dental infection.  Continue Gabapentin and B12 for neuropathy.   - Continue Ozempic 2 MG weekly (could not get Trulicity, not available, and could not get Mounjaro in past due to shortage) and continue [place] daily with goal in future to reduce to discontinue insulin, discussed with patient.  At this time continue Glipizide 10 MG BID and Lantus 30 units, discussed with her that if lows present she is to reduce this by 3 units every 3 days if needed = if BS in morning <130 consistently.   - Consider transition to [facility] in future if available and if still not <7% on max dose GLP1.  Discussed with patient and she is interested in this change if needed. - Check BS TID and document for visits.   - Up to date on eye and foot exam - On ACE and Statin - Refuses PCV20 - Return in 3 months.

## 2022-09-25 NOTE — Assessment & Plan Note (Signed)
Reports she was told she did not need CPAP, but to focus on weight loss -- may benefit repeat study in future. °

## 2022-09-25 NOTE — Assessment & Plan Note (Signed)
Chronic, stable.  BP at goal in office.  Currently on Lisinopril for kidney protection and BP, may benefit from switch to ARB in future due to her underlying asthma.  Recommend she monitor BP at least a few mornings a week at home and document.  DASH diet at home.  Labs today: A1c.  Urine ALB 80 April 2024.  Return in 3 months.

## 2022-09-25 NOTE — Assessment & Plan Note (Signed)
Chronic, ongoing, and stable with inhaler use at this time.  Continue current inhaler regimen as ordered and adjust as needed.  Plan on spirometry at future visit.  Continue modest weight loss.  Labs today: up to date.

## 2022-09-25 NOTE — Assessment & Plan Note (Signed)
BMI 36.66 with T2DM, HLD, HTN.  Recommended eating smaller high protein, low fat meals more frequently and exercising 30 mins a day 5 times a week with a goal of 10-15lb weight loss in the next 3 months. Patient voiced their understanding and motivation to adhere to these recommendations.

## 2022-09-25 NOTE — Assessment & Plan Note (Addendum)
Chronic, ongoing.  Continue current medication regimen and adjust as needed.  Lipid panel up to date. 

## 2022-09-25 NOTE — Progress Notes (Signed)
BP 117/78   Pulse 71   Temp 97.9 F (36.6 C) (Oral)   Ht 5' 2.09" (1.577 m)   Wt 201 lb (91.2 kg)   SpO2 98%   BMI 36.66 kg/m    Subjective:    Patient ID: Michelle Warner, female    DOB: 1975-06-14, 47 y.o.   MRN: [id number]  HPI: Michelle Warner is a 47 y.o. female  Chief Complaint  Patient presents with   Diabetes   DIABETES A1c 7.1% in April.  Taking Ozempic 1 MG weekly, Farxiga 10 daily + Glipizide 10 MG BID and Lantus 30 units at night -- goal is to reduce insulin and discontinue if possible in long run.   Continues on Gabapentin for neuropathy pain + Vitamin B12 supplements.  Vitamin D supplement weekly for history of low levels. Has lost total of 36 pounds over past year with use of GLP1 and seen improvement of A1c.    Took Metformin in past but this caused major GI issues. Hypoglycemic episodes:no Polydipsia/polyuria: no Visual disturbance: no Chest pain: no Paresthesias: no Glucose Monitoring: yes  Accucheck frequency: Daily  Fasting glucose: 96 to 169  Post prandial:  Evening:  Before meals: Taking Insulin?: yes  Long acting insulin: 30 units Lantus  Short acting insulin: Blood Pressure Monitoring: not checking Retinal Examination: Up to Date -- [facility], injections often Foot Exam: Up to Date Pneumovax: Refuses Influenza: Refuses Aspirin: no   HYPERTENSION / HYPERLIPIDEMIA Taking Lisinopril 20 MG daily and Crestor 10 MG daily.  Has history of sleep study with recommendation for weight loss, no CPAP. Satisfied with current treatment? yes Duration of hypertension: chronic BP monitoring frequency: rarely BP range:  BP medication side effects: no Duration of hyperlipidemia: chronic Cholesterol medication side effects: no Cholesterol supplements: none Medication compliance: good compliance Aspirin: no Recent stressors: no Recurrent headaches: no Visual changes: no Palpitations: no Dyspnea: no Chest pain: no Lower extremity edema:  no Dizzy/lightheaded: no   ASTHMA Continues Flovent 2 puffs BID + Albuterol PRN. Asthma status: stable Satisfied with current treatment?: yes Albuterol/rescue inhaler frequency: once a month Dyspnea frequency: no Wheezing frequency: no Cough frequency: occasional Nocturnal symptom frequency: no Limitation of activity: no Current upper respiratory symptoms: no Triggers: pollen Home peak flows: none Aerochamber/spacer use: no Visits to ER or Urgent Care in past year: no Pneumovax:  refused Influenza:  refused    DENTAL PAIN Seen in ER 09/16/22 for this and has return of pain. Has not seen dentist as of yet.  Was given Augmentin in ER + 2 days of Norco 5-325 (she is taking 1/2 of one of these) + mouth wash (did not receive this).  She would prefer Ibuprofen 800 MG. Duration: weeks Involved teeth: right and upper Dentist evaluation: no Mechanism of injury:  no trauma Onset: sudden Severity: 9/10 Quality: throbbing and aching Frequency: constant Radiation: none Aggravating factors: hard foods and chewing Alleviating factors: NSAIDs Status: fluctuating Treatments attempted: APAP and NSAIDs Relief with NSAIDs?: mild Fevers: no Swelling: yes a little to right side face Redness: no Paresthesias / decreased sensation: no Sinus pressure: yes   DEPRESSION Continues on Celexa and Trazodone.  Had visits with [facility] in past. Mood status: stable Satisfied with current treatment?: yes Symptom severity: moderate  Duration of current treatment : chronic Side effects: no Medication compliance: good compliance Psychotherapy/counseling: none Depressed mood: no Anxious mood: occasionally Anhedonia: no Significant weight loss or gain: no Insomnia: yes hard to fall asleep Fatigue: yes Feelings  of worthlessness or guilt: no Impaired concentration/indecisiveness: no Suicidal ideations: no Hopelessness: no Crying spells: no    09/25/2022    2:28 PM 06/26/2022    1:10 PM 02/20/2022     2:31 PM 11/18/2021    4:08 PM 08/12/2021    9:09 AM  Depression screen PHQ 2/9  Decreased Interest 0 0 0 0 0  Down, Depressed, Hopeless 1 1 0 0 0  PHQ - 2 Score 1 1 0 0 0  Altered sleeping 1 1 1 2 3   Tired, decreased energy 1 2 3 1 3   Change in appetite 0 0 0 0 0  Feeling bad or failure about yourself  0 0 0 0 0  Trouble concentrating 0 1 0 1 0  Moving slowly or fidgety/restless 0 0 0 0 0  Suicidal thoughts 0 0 0 0 0  PHQ-9 Score 3 5 4 4 6   Difficult doing work/chores Not difficult at all Not difficult at all Not difficult at all Not difficult at all        09/25/2022    2:28 PM 06/26/2022    1:11 PM 02/20/2022    2:31 PM 11/18/2021    4:08 PM  GAD 7 : Generalized Anxiety Score  Nervous, Anxious, on Edge 1 2 1 2   Control/stop worrying 1 1 2 1   Worry too much - different things 1 1 1 3   Trouble relaxing 0 1 0 1  Restless 0 1 0 0  Easily annoyed or irritable 1 2 1 3   Afraid - awful might happen 1 0 0 0  Total GAD 7 Score 5 8 5 10   Anxiety Difficulty Not difficult at all Not difficult at all Not difficult at all Not difficult at all   Relevant past medical, surgical, family and social history reviewed and updated as indicated. Interim medical history since our last visit reviewed. Allergies and medications reviewed and updated.  Review of Systems  Constitutional:  Negative for activity change, appetite change, diaphoresis, fatigue and fever.  Respiratory:  Negative for cough, chest tightness, shortness of breath and wheezing.   Cardiovascular:  Negative for chest pain, palpitations and leg swelling.  Gastrointestinal: Negative.   Endocrine: Negative for cold intolerance, heat intolerance, polydipsia, polyphagia and polyuria.  Neurological: Negative.   Psychiatric/Behavioral: Negative.     Per HPI unless specifically indicated above     Objective:    BP 117/78   Pulse 71   Temp 97.9 F (36.6 C) (Oral)   Ht 5' 2.09" (1.577 m)   Wt 201 lb (91.2 kg)   SpO2 98%   BMI 36.66  kg/m   Wt Readings from Last 3 Encounters:  09/25/22 201 lb (91.2 kg)  09/16/22 205 lb (93 kg)  06/26/22 200 lb 1.6 oz (90.8 kg)    Physical Exam Vitals and nursing note reviewed.  Constitutional:      General: She is awake. She is not in acute distress.    Appearance: She is well-developed and well-groomed. She is obese. She is not ill-appearing or toxic-appearing.  HENT:     Head: Normocephalic.     Right Ear: Hearing normal.     Left Ear: Hearing normal.     Mouth/Throat:     Dentition: Abnormal dentition. Dental tenderness and dental caries present.      Comments: Multiple missing teeth on exam. Eyes:     General: Lids are normal.        Right eye: No discharge.  Left eye: No discharge.     Pupils: Pupils are equal, round, and reactive to light.     Funduscopic exam:    Right eye: No hemorrhage.        Left eye: No hemorrhage.  Neck:     Thyroid: No thyromegaly.     Vascular: No carotid bruit.  Cardiovascular:     Rate and Rhythm: Normal rate and regular rhythm.     Heart sounds: Murmur heard.     Systolic murmur is present with a grade of 3/6.     No gallop.  Pulmonary:     Effort: Pulmonary effort is normal. No accessory muscle usage or respiratory distress.     Breath sounds: Normal breath sounds.  Abdominal:     General: Bowel sounds are normal.     Palpations: Abdomen is soft.  Musculoskeletal:     Cervical back: Normal range of motion and neck supple.     Right lower leg: No edema.     Left lower leg: No edema.  Lymphadenopathy:     Head:     Right side of head: No submental, submandibular, tonsillar, preauricular or posterior auricular adenopathy.     Left side of head: No submental, submandibular, tonsillar, preauricular or posterior auricular adenopathy.     Cervical: No cervical adenopathy.  Skin:    General: Skin is warm and dry.  Neurological:     Mental Status: She is alert and oriented to person, place, and time.  Psychiatric:         Attention and Perception: Attention normal.        Mood and Affect: Mood normal.        Speech: Speech normal.        Behavior: Behavior normal. Behavior is cooperative.        Thought Content: Thought content normal.    Results for orders placed or performed in visit on 09/25/22  Bayer DCA Hb A1c Waived  Result Value Ref Range   HB A1C (BAYER DCA - WAIVED) 7.3 (H) 4.8 - 5.6 %      Assessment & Plan:   Problem List Items Addressed This Visit       Cardiovascular and Mediastinum   Hypertension associated with diabetes (HCC)    Chronic, stable.  BP at goal in office.  Currently on Lisinopril for kidney protection and BP, may benefit from switch to ARB in future due to her underlying asthma.  Recommend she monitor BP at least a few mornings a week at home and document.  DASH diet at home.  Labs today: A1c.  Urine ALB 80 April 2024.  Return in 3 months.       Relevant Medications   LANTUS SOLOSTAR 100 UNIT/ML Solostar Pen   Semaglutide, 2 MG/DOSE, 8 MG/3ML SOPN   Other Relevant Orders   Bayer DCA Hb A1c Waived (Completed)     Respiratory   Mild persistent asthma without complication    Chronic, ongoing, and stable with inhaler use at this time.  Continue current inhaler regimen as ordered and adjust as needed.  Plan on spirometry at future visit.  Continue modest weight loss.  Labs today: up to date.      Obstructive sleep apnea of adult    Reports she was told she did not need CPAP, but to focus on weight loss -- may benefit repeat study in future.        Endocrine   Type 2 diabetes mellitus with diabetic polyneuropathy,  with long-term current use of insulin (HCC)    Chronic, ongoing with improved A1c today 7.3%, previous 7.1%, mild trend up, suspect some related to dental infection.  Continue Gabapentin and B12 for neuropathy.   - Continue Ozempic 2 MG weekly (could not get Trulicity, not available, and could not get Mounjaro in past due to shortage) and continue [place]  daily with goal in future to reduce to discontinue insulin, discussed with patient.  At this time continue Glipizide 10 MG BID and Lantus 30 units, discussed with her that if lows present she is to reduce this by 3 units every 3 days if needed = if BS in morning <130 consistently.   - Consider transition to [facility] in future if available and if still not <7% on max dose GLP1.  Discussed with patient and she is interested in this change if needed. - Check BS TID and document for visits.   - Up to date on eye and foot exam - On ACE and Statin - Refuses PCV20 - Return in 3 months.      Relevant Medications   LANTUS SOLOSTAR 100 UNIT/ML Solostar Pen   Semaglutide, 2 MG/DOSE, 8 MG/3ML SOPN   Other Relevant Orders   Bayer DCA Hb A1c Waived (Completed)   Hyperlipidemia associated with type 2 diabetes mellitus (HCC)    Chronic, ongoing.  Continue current medication regimen and adjust as needed.  Lipid panel up to date.      Relevant Medications   LANTUS SOLOSTAR 100 UNIT/ML Solostar Pen   Semaglutide, 2 MG/DOSE, 8 MG/3ML SOPN   Other Relevant Orders   Bayer DCA Hb A1c Waived (Completed)   Type 2 diabetes mellitus with both eyes affected by mild nonproliferative retinopathy without macular edema, with long-term current use of insulin (HCC)    Chronic, ongoing, continue visits with ophthalmology.  Refer to diabetes with neuropathy plan of care for further.      Relevant Medications   LANTUS SOLOSTAR 100 UNIT/ML Solostar Pen   Semaglutide, 2 MG/DOSE, 8 MG/3ML SOPN   Type 2 diabetes mellitus with proteinuria (HCC) - Primary    Refer to diabetes with neuropathy plan of care and continue Lisinopril for kidney protection. Urine ALB 80 April 2024.      Relevant Medications   LANTUS SOLOSTAR 100 UNIT/ML Solostar Pen   Semaglutide, 2 MG/DOSE, 8 MG/3ML SOPN   Other Relevant Orders   Bayer DCA Hb A1c Waived (Completed)     Nervous and Auditory   Vitamin B12 deficiency neuropathy (HCC)     Ongoing.  Continue OTC supplement which is offering benefit to her neuropathy.  Recheck level next visit.        Other   Anxiety    Chronic, ongoing. Was followed by [facility] in past, but PCP currently fills prescriptions.  Will return to them if any worsening mood.  Denies SI/HI.  Continue current medication regimen which offers benefit to her.      Heart murmur, systolic    Ongoing with history of work-up in past.  More prominent on exam today, have ordered repeat echo to check on this but she missed visit, will reach out to referral coordinator to see if can get rescheduled, and send to cardiology as needed.  She denies any symptoms.      Morbid obesity (HCC)    BMI 36.66 with T2DM, HLD, HTN.  Recommended eating smaller high protein, low fat meals more frequently and exercising 30 mins a day 5 times a week  with a goal of 10-15lb weight loss in the next 3 months. Patient voiced their understanding and motivation to adhere to these recommendations.       Relevant Medications   LANTUS SOLOSTAR 100 UNIT/ML Solostar Pen   magic mouthwash w/lidocaine SOLN   Semaglutide, 2 MG/DOSE, 8 MG/3ML SOPN   Other Visit Diagnoses     Pain due to dental caries       Send in Ibuprofen and Magic Mouthwash.  Referral to dental placed for further assessment.   Relevant Medications   magic mouthwash w/lidocaine SOLN   Other Relevant Orders   Ambulatory referral to Dentistry        Follow up plan: Return in about 3 months (around 12/26/2022) for T2DM, HTN/HLD, MOOD, ASTHMA.

## 2022-09-25 NOTE — Assessment & Plan Note (Signed)
Ongoing with history of work-up in past.  More prominent on exam today, have ordered repeat echo to check on this but she missed visit, will reach out to referral coordinator to see if can get rescheduled, and send to cardiology as needed.  She denies any symptoms.

## 2022-09-25 NOTE — Assessment & Plan Note (Addendum)
Chronic, ongoing, continue visits with ophthalmology.  Refer to diabetes with neuropathy plan of care for further.

## 2022-09-25 NOTE — Assessment & Plan Note (Signed)
Ongoing.  Continue OTC supplement which is offering benefit to her neuropathy.  Recheck level next visit. 

## 2022-09-26 ENCOUNTER — Encounter: Payer: BLUE CROSS/BLUE SHIELD | Admitting: Physical Therapy

## 2022-09-27 ENCOUNTER — Encounter: Payer: BLUE CROSS/BLUE SHIELD | Admitting: Physical Therapy

## 2022-09-28 ENCOUNTER — Encounter: Payer: BLUE CROSS/BLUE SHIELD | Admitting: Physical Therapy

## 2022-10-02 ENCOUNTER — Encounter: Payer: BLUE CROSS/BLUE SHIELD | Admitting: Physical Therapy

## 2022-10-03 ENCOUNTER — Encounter: Payer: BLUE CROSS/BLUE SHIELD | Admitting: Physical Therapy

## 2022-10-04 ENCOUNTER — Encounter: Payer: BLUE CROSS/BLUE SHIELD | Admitting: Physical Therapy

## 2022-10-05 ENCOUNTER — Encounter: Payer: BLUE CROSS/BLUE SHIELD | Admitting: Physical Therapy

## 2022-10-09 ENCOUNTER — Encounter: Payer: BLUE CROSS/BLUE SHIELD | Admitting: Physical Therapy

## 2022-10-11 ENCOUNTER — Encounter: Payer: BLUE CROSS/BLUE SHIELD | Admitting: Physical Therapy

## 2022-10-16 ENCOUNTER — Encounter: Payer: BLUE CROSS/BLUE SHIELD | Admitting: Physical Therapy

## 2022-10-16 ENCOUNTER — Other Ambulatory Visit: Payer: Self-pay | Admitting: Nurse Practitioner

## 2022-10-16 ENCOUNTER — Telehealth: Payer: Self-pay | Admitting: Nurse Practitioner

## 2022-10-16 NOTE — Telephone Encounter (Signed)
Pt is calling in because she wants to know if Jolene can send her in something for pain. Pt says she completed her infection medication and is still experiencing pain in the area. Pt says it's the same pain she was having when she spoke with Jolene the first time. Pt says she can't have the procedure because she currently doesn't have power, but is in pain. Please advise.

## 2022-10-16 NOTE — Telephone Encounter (Signed)
Called and scheduled patient on 11/06/2022 @ 3:40 pm.

## 2022-10-17 ENCOUNTER — Other Ambulatory Visit: Payer: Self-pay | Admitting: Nurse Practitioner

## 2022-10-17 ENCOUNTER — Telehealth: Payer: Self-pay | Admitting: Nurse Practitioner

## 2022-10-17 NOTE — Telephone Encounter (Signed)
Already requested by pharmacy, sent to office for provider approval on 10/17/22 in a separate refill encounter.

## 2022-10-17 NOTE — Telephone Encounter (Signed)
Medication Refill - Medication: LANTUS SOLOSTAR 100 UNIT/ML Solostar Pen  Pt stated she has been out for two days!. Please advise.   Has the patient contacted their pharmacy? Yes.   No, more refills.   (Agent: If yes, when and what did the pharmacy advise?)  Preferred Pharmacy (with phone number or street name):  [facility] [address]), [place] - [address]  [address]) [place] [address]  Phone: [phone] Fax: [phone]  Hours: Not open 24 hours   Has the patient been seen for an appointment in the last year OR does the patient have an upcoming appointment? Yes.    Agent: Please be advised that RX refills may take up to 3 business days. We ask that you follow-up with your pharmacy.

## 2022-10-17 NOTE — Telephone Encounter (Signed)
Requested medication (s) are due for refill today: Yes  Requested medication (s) are on the active medication list: Yes  Last refill:  09/23/22  Future visit scheduled: Yes  Notes to clinic:  Unable to refill per protocol, last refill by another provider.      Requested Prescriptions  Pending Prescriptions Disp Refills   LANTUS SOLOSTAR 100 UNIT/ML Solostar Pen [Pharmacy Med Name: Lantus SoloStar 100 UNIT/ML Subcutaneous Solution Pen-injector] 15 mL 0    Sig: INJECT 30 UNITS SUBCUTANEOUSLY AT BEDTIME     Endocrinology:  Diabetes - Insulins Passed - 10/16/2022 11:57 AM      Passed - HBA1C is between 0 and 7.9 and within 180 days    HB A1C (BAYER DCA - WAIVED)  Date Value Ref Range Status  09/25/2022 7.3 (H) 4.8 - 5.6 % Final    Comment:             Prediabetes: 5.7 - 6.4          Diabetes: >6.4          Glycemic control for adults with diabetes: <7.0          Passed - Valid encounter within last 6 months    Recent Outpatient Visits           3 weeks ago Type 2 diabetes mellitus with proteinuria (HCC)   [place] [facility] [place], [place] T, NP   3 months ago Type 2 diabetes mellitus with proteinuria (HCC)   [place] [facility] [place], Valentine T, NP   7 months ago Type 2 diabetes mellitus with proteinuria (HCC)   [place] [facility] [place], [place] T, NP   11 months ago Type 2 diabetes mellitus with proteinuria (HCC)   [place] [facility] [place], Corrie Dandy T, NP   1 year ago Type 2 diabetes mellitus with proteinuria (HCC)   [place] [facility] [place], Dorie Rank, NP       Future Appointments             In 2 weeks [name], Dorie Rank, NP [place] [facility], [facility]   In 2 months [place], Dorie Rank, NP [place] [facility], [facility]

## 2022-10-18 ENCOUNTER — Encounter: Payer: BLUE CROSS/BLUE SHIELD | Admitting: Physical Therapy

## 2022-10-18 ENCOUNTER — Ambulatory Visit: Payer: Self-pay

## 2022-10-18 NOTE — Telephone Encounter (Signed)
Requested medication (s) are due for refill today: Yes  Requested medication (s) are on the active medication list: Yes/  Last refill:  10/17/22  Future visit scheduled: Yes  Notes to clinic:  Unable to refill per protocol, medication not assigned to the refill protocol. Pharmacy request to change to generic      Requested Prescriptions  Pending Prescriptions Disp Refills   SEMGLEE, YFGN, 100 UNIT/ML Pen [Pharmacy Med Name: SEMGLEE 100U/ML PEN INJ] 15 mL 4    Sig: INJECT 30 UNITS SUBCUTANEOUSLY AT BEDTIME     Off-Protocol Failed - 10/17/2022  6:37 PM      Failed - Medication not assigned to a protocol, review manually.      Passed - Valid encounter within last 12 months    Recent Outpatient Visits           3 weeks ago Type 2 diabetes mellitus with proteinuria (HCC)   [place] [facility] [place], [place] T, NP   3 months ago Type 2 diabetes mellitus with proteinuria (HCC)   [place] [facility] [place], Zebulon T, NP   8 months ago Type 2 diabetes mellitus with proteinuria (HCC)   [place] [facility] [place], [place] T, NP   11 months ago Type 2 diabetes mellitus with proteinuria (HCC)   [place] [facility] [place], Corrie Dandy T, NP   1 year ago Type 2 diabetes mellitus with proteinuria (HCC)   [name] [facility] [place], Dorie Rank, NP       Future Appointments             In 2 weeks [name], Dorie Rank, NP [place] [facility], [facility]   In 2 months [name], Dorie Rank, NP  [facility], [facility]

## 2022-10-18 NOTE — Telephone Encounter (Signed)
Patient called, left VM to return the call to the office to speak to the NT.   Summary: medication questions   Patient states that is out of her insulin and is waiting for provider to see in a medication that her insurance will cover. Patient wants to know if she needs to go to the hospital to get insulin. Patient said her fasting blood sugar this morning was around 130. Please advise.

## 2022-10-18 NOTE — Telephone Encounter (Signed)
Patient called, left VM to return the call to the office.   Summary: medication questions   Patient states that is out of her insulin and is waiting for provider to see in a medication that her insurance will cover. Patient wants to know if she needs to go to the hospital to get insulin. Patient said her fasting blood sugar this morning was around 130. Please advise.

## 2022-10-18 NOTE — Telephone Encounter (Signed)
Copied from CRM [id number]. Topic: General - Inquiry >> Oct 18, 2022  2:41 PM Clide Dales wrote: Patient states that her insurance does not want to cover Lantus and wants to know if something else can be given. Please advise.

## 2022-10-19 NOTE — Telephone Encounter (Signed)
Called and spoke to patient. New RX for a different insulin was sent in yesterday afternoon for the patient. Patient states she was able to pick this up and has started taking it already.

## 2022-10-23 ENCOUNTER — Encounter: Payer: BLUE CROSS/BLUE SHIELD | Admitting: Physical Therapy

## 2022-10-24 ENCOUNTER — Ambulatory Visit: Payer: BLUE CROSS/BLUE SHIELD | Admitting: Gastroenterology

## 2022-10-25 ENCOUNTER — Encounter: Payer: BLUE CROSS/BLUE SHIELD | Admitting: Physical Therapy

## 2022-10-30 ENCOUNTER — Encounter: Payer: BLUE CROSS/BLUE SHIELD | Admitting: Physical Therapy

## 2022-11-01 ENCOUNTER — Encounter: Payer: BLUE CROSS/BLUE SHIELD | Admitting: Physical Therapy

## 2022-11-06 ENCOUNTER — Encounter: Payer: BLUE CROSS/BLUE SHIELD | Admitting: Physical Therapy

## 2022-11-06 ENCOUNTER — Ambulatory Visit: Payer: BLUE CROSS/BLUE SHIELD | Admitting: Nurse Practitioner

## 2022-11-08 ENCOUNTER — Encounter: Payer: BLUE CROSS/BLUE SHIELD | Admitting: Physical Therapy

## 2022-11-19 ENCOUNTER — Other Ambulatory Visit: Payer: Self-pay | Admitting: Family Medicine

## 2022-11-21 NOTE — Telephone Encounter (Signed)
Requested Prescriptions  Pending Prescriptions Disp Refills   loratadine (CLARITIN) 10 MG tablet [Pharmacy Med Name: Loratadine 10 MG Oral Tablet] 90 tablet 0    Sig: Take 1 tablet by mouth once daily     Ear, Nose, and Throat:  Antihistamines 2 Passed - 11/19/2022  2:05 AM      Passed - Cr in normal range and within 360 days    Creatinine  Date Value Ref Range Status  08/24/2011 0.71 0.60 - 1.30 mg/dL Final   Creatinine, Ser  Date Value Ref Range Status  06/26/2022 0.71 0.57 - 1.00 mg/dL Final         Passed - Valid encounter within last 12 months    Recent Outpatient Visits           1 month ago Type 2 diabetes mellitus with proteinuria (HCC)   [place] [facility] [place], [place] T, NP   4 months ago Type 2 diabetes mellitus with proteinuria (HCC)   [place] [facility] [place], [place] T, NP   9 months ago Type 2 diabetes mellitus with proteinuria (HCC)   [place] [facility] [place], Corrie Dandy T, NP   1 year ago Type 2 diabetes mellitus with proteinuria (HCC)   [place] [facility] [place], Corrie Dandy T, NP   1 year ago Type 2 diabetes mellitus with proteinuria (HCC)   [place] [facility] [place], Dorie Rank, NP       Future Appointments             In 1 month [name], Dorie Rank, NP  [facility], [facility]

## 2022-11-29 ENCOUNTER — Other Ambulatory Visit: Payer: Self-pay | Admitting: Family Medicine

## 2022-11-30 NOTE — Telephone Encounter (Signed)
Last refilled 11/21/22 Requested Prescriptions  Pending Prescriptions Disp Refills   EQ ALL DAY ALLERGY RELIEF 10 MG tablet [Pharmacy Med Name: EQ All Day Allergy Relief 10 MG Oral Tablet] 90 tablet 0    Sig: Take 1 tablet by mouth once daily     Ear, Nose, and Throat:  Antihistamines 2 Passed - 11/29/2022 12:24 PM      Passed - Cr in normal range and within 360 days    Creatinine  Date Value Ref Range Status  08/24/2011 0.71 0.60 - 1.30 mg/dL Final   Creatinine, Ser  Date Value Ref Range Status  06/26/2022 0.71 0.57 - 1.00 mg/dL Final         Passed - Valid encounter within last 12 months    Recent Outpatient Visits           2 months ago Type 2 diabetes mellitus with proteinuria (HCC)   [place] [facility] [place], [place] T, NP   5 months ago Type 2 diabetes mellitus with proteinuria (HCC)   [place] [facility] [place], [place] T, NP   9 months ago Type 2 diabetes mellitus with proteinuria (HCC)   [place] [facility] [place], Corrie Dandy T, NP   1 year ago Type 2 diabetes mellitus with proteinuria (HCC)   [place] [facility] [place], Corrie Dandy T, NP   1 year ago Type 2 diabetes mellitus with proteinuria (HCC)   [place] [facility] [place], Dorie Rank, NP       Future Appointments             In 3 weeks [name], Dorie Rank, NP [place] [facility], [facility]

## 2022-12-20 ENCOUNTER — Telehealth: Payer: Self-pay | Admitting: Nurse Practitioner

## 2022-12-20 ENCOUNTER — Other Ambulatory Visit: Payer: Self-pay | Admitting: Family Medicine

## 2022-12-20 NOTE — Telephone Encounter (Signed)
Already requested by the pharmacy today in a separate refill encounter, pending approval.

## 2022-12-20 NOTE — Telephone Encounter (Signed)
Medication Refill - Medication: beclomethasone (QVAR REDIHALER) 40 MCG/ACT inhaler   Pt stated she is out of her inhaler.   Has the patient contacted their pharmacy? Yes.   No, more refills.  (Agent: If yes, when and what did the pharmacy advise?)  Preferred Pharmacy (with phone number or street name):  [facility] [address]), [place] - [address]  [address]) [place] [address]  Phone: [phone] Fax: [phone]  Hours: Not open 24 hours   Has the patient been seen for an appointment in the last year OR does the patient have an upcoming appointment? Yes.    Agent: Please be advised that RX refills may take up to 3 business days. We ask that you follow-up with your pharmacy.

## 2022-12-21 NOTE — Telephone Encounter (Signed)
Requested Prescriptions  Pending Prescriptions Disp Refills   beclomethasone (QVAR REDIHALER) 40 MCG/ACT inhaler [Pharmacy Med Name: Qvar RediHaler 40 MCG/ACT Inhalation Aerosol Breath Activated] 11 g 0    Sig: Inhale 2 puffs by mouth twice daily     Pulmonology:  Corticosteroids Passed - 12/20/2022  2:50 PM      Passed - Valid encounter within last 12 months    Recent Outpatient Visits           2 months ago Type 2 diabetes mellitus with proteinuria (HCC)   [facility] [place], [place] T, NP   5 months ago Type 2 diabetes mellitus with proteinuria (HCC)   [place] [place], [place] T, NP   10 months ago Type 2 diabetes mellitus with proteinuria (HCC)   [place] [facility] [place], Corrie Dandy T, NP   1 year ago Type 2 diabetes mellitus with proteinuria (HCC)   [facility] [place], Corrie Dandy T, NP   1 year ago Type 2 diabetes mellitus with proteinuria (HCC)   [place] [place], Dorie Rank, NP       Future Appointments             In 5 days Marjie Skiff, NP [place] [facility]

## 2022-12-23 NOTE — Patient Instructions (Signed)
Be Involved in Caring For Your Health:  Taking Medications When medications are taken as directed, they can greatly improve your health. But if they are not taken as prescribed, they may not work. In some cases, not taking them correctly can be harmful. To help ensure your treatment remains effective and safe, understand your medications and how to take them. Bring your medications to each visit for review by your provider.  Your lab results, notes, and after visit summary will be available on My Chart. We strongly encourage you to use this feature. If lab results are abnormal the clinic will contact you with the appropriate steps. If the clinic does not contact you assume the results are satisfactory. You can always view your results on My Chart. If you have questions regarding your health or results, please contact the clinic during office hours. You can also ask questions on My Chart.  We at [facility] are grateful that you chose [place] to provide your care. We strive to provide evidence-based and compassionate care and are always looking for feedback. If you get a survey from the clinic please complete this so we can hear your opinions.  Diabetes Mellitus and Exercise Regular exercise is important for your health, especially if you have diabetes mellitus. Exercise is not just about losing weight. It can also help you increase muscle strength and bone density and reduce body fat and stress. This can help your level of endurance and make you more fit and flexible. Why should I exercise if I have diabetes? Exercise has many benefits for people with diabetes. It can: Help lower and control your blood sugar (glucose). Help your body respond better and become more sensitive to the hormone insulin. Reduce how much insulin your body needs. Lower your risk for heart disease by: Lowering how much "bad" cholesterol and triglycerides you have in your body. Increasing how much "good" cholesterol  you have in your body. Lowering your blood pressure. Lowering your blood glucose levels. What is my activity plan? Your health care provider or an expert trained in diabetes care (certified diabetes educator) can help you make an activity plan. This plan can help you find the type of exercise that works for you. It may also tell you how often to exercise and for how long. Be sure to: Get at least 150 minutes of medium-intensity or high-intensity exercise each week. This may involve brisk walking, biking, or water aerobics. Do stretching and strengthening exercises at least 2 times a week. This may involve yoga or weight lifting. Spread out your activity over at least 3 days of the week. Get some form of physical activity each day. Do not go more than 2 days in a row without some kind of activity. Avoid being inactive for more than 30 minutes at a time. Take frequent breaks to walk or stretch. Choose activities that you enjoy. Set goals that you know you can accomplish. Start slowly and increase the intensity of your exercise over time. How do I manage my diabetes during exercise?  Monitor your blood glucose Check your blood glucose before and after you exercise. If your blood glucose is 240 mg/dL (40.9 mmol/L) or higher before you exercise, check your urine for ketones. These are chemicals created by the liver. If you have ketones in your urine, do not exercise until your blood glucose returns to normal. If your blood glucose is 100 mg/dL (5.6 mmol/L) or lower, eat a snack that has 15-20 grams of carbohydrate in  it. Check your blood glucose 15 minutes after the snack to make sure that your level is above 100 mg/dL (5.6 mmol/L) before you start to exercise. Your risk for low blood glucose (hypoglycemia) goes up during and after exercise. Know the symptoms of this condition and how to treat it. Follow these instructions at home: Keep a carbohydrate snack on hand for use before, during, and after  exercise. This can help prevent or treat hypoglycemia. Avoid injecting insulin into parts of your body that are going to be used during exercise. This may include: Your arms, when you are going to play tennis. Your legs, when you are about to go jogging. Keep track of your exercise habits. This can help you and your health care provider watch and adjust your activity plan. Write down: What you eat before and after you exercise. Blood glucose levels before and after you exercise. The type and amount of exercise you do. Talk to your health care provider before you start a new activity. They may need to: Make sure that the activity is safe for you. Adjust your insulin, other medicines, and food that you eat. Drink water while you exercise. This can stop you from losing too much water (dehydration). It can also prevent problems caused by having a lot of heat in your body (heat stroke). Where to find more information American Diabetes Association: diabetes.org Association of Diabetes Care & Education Specialists: diabeteseducator.org This information is not intended to replace advice given to you by your health care provider. Make sure you discuss any questions you have with your health care provider. Document Revised: 08/24/2021 Document Reviewed: 08/24/2021 Elsevier Patient Education  2024 [employer].

## 2022-12-26 ENCOUNTER — Ambulatory Visit (INDEPENDENT_AMBULATORY_CARE_PROVIDER_SITE_OTHER): Payer: BLUE CROSS/BLUE SHIELD | Admitting: Nurse Practitioner

## 2022-12-26 ENCOUNTER — Encounter: Payer: Self-pay | Admitting: Nurse Practitioner

## 2022-12-26 VITALS — BP 112/75 | HR 80 | Temp 98.2°F | Ht 62.0 in | Wt 206.2 lb

## 2022-12-26 DIAGNOSIS — Z599 Problem related to housing and economic circumstances, unspecified: Secondary | ICD-10-CM

## 2022-12-26 DIAGNOSIS — E1159 Type 2 diabetes mellitus with other circulatory complications: Secondary | ICD-10-CM | POA: Diagnosis not present

## 2022-12-26 DIAGNOSIS — E1169 Type 2 diabetes mellitus with other specified complication: Secondary | ICD-10-CM | POA: Diagnosis not present

## 2022-12-26 DIAGNOSIS — E538 Deficiency of other specified B group vitamins: Secondary | ICD-10-CM

## 2022-12-26 DIAGNOSIS — E1142 Type 2 diabetes mellitus with diabetic polyneuropathy: Secondary | ICD-10-CM | POA: Diagnosis not present

## 2022-12-26 DIAGNOSIS — Z794 Long term (current) use of insulin: Secondary | ICD-10-CM

## 2022-12-26 DIAGNOSIS — R011 Cardiac murmur, unspecified: Secondary | ICD-10-CM

## 2022-12-26 DIAGNOSIS — F419 Anxiety disorder, unspecified: Secondary | ICD-10-CM

## 2022-12-26 DIAGNOSIS — G4733 Obstructive sleep apnea (adult) (pediatric): Secondary | ICD-10-CM

## 2022-12-26 DIAGNOSIS — E1129 Type 2 diabetes mellitus with other diabetic kidney complication: Secondary | ICD-10-CM

## 2022-12-26 DIAGNOSIS — J4530 Mild persistent asthma, uncomplicated: Secondary | ICD-10-CM

## 2022-12-26 DIAGNOSIS — R809 Proteinuria, unspecified: Secondary | ICD-10-CM

## 2022-12-26 MED ORDER — BUSPIRONE HCL 5 MG PO TABS: 5.0000 mg | ORAL_TABLET | Freq: Two times a day (BID) | ORAL | 4 refills | Status: DC

## 2022-12-26 NOTE — Progress Notes (Signed)
BP 112/75   Pulse 80   Temp 98.2 F (36.8 C) (Oral)   Ht 5\' 2"  (1.575 m)   Wt 206 lb 3.2 oz (93.5 kg)   BMI 37.71 kg/m    Subjective:    Patient ID: Michelle Warner, female    DOB: November 04, 1975, 47 y.o.   MRN: [id number]  HPI: Michelle Warner is a 47 y.o. female  Chief Complaint  Patient presents with   Diabetes   Asthma   Depression   Hyperlipidemia   Hypertension   DIABETES A1c August was 7.3%.  Taking Ozempic 2 MG weekly, Farxiga 10 daily + Glipizide 10 MG BID and Lantus 30 units at night.  Goal in long run is to reduce insulin and discontinue if possible. Sugars have been trending up some this visit -- we discussed change to [facility] if needed.  Takes Gabapentin for neuropathy pain + Vitamin B12 supplements.  Had lost total of 36 pounds over past year with use of GLP1, but did recently gain 4 lbs back.    Took Metformin in past but this caused major GI issues. Hypoglycemic episodes:no Polydipsia/polyuria: no Visual disturbance: no Chest pain: no Paresthesias: no Glucose Monitoring: yes  Accucheck frequency: Daily  Fasting glucose: 77 to 180  Post prandial:  Evening:  Before meals: Taking Insulin?: yes  Long acting insulin: 30 units Lantus  Short acting insulin: Blood Pressure Monitoring: not checking Retinal Examination: Up to Date --  Eye, injections given Foot Exam: Up to Date Pneumovax: Refuses Influenza: Refuses Aspirin: no   HYPERTENSION / HYPERLIPIDEMIA Takes Lisinopril 20 MG daily and Crestor 10 MG daily.  History of sleep study with recommendation for weight loss, no CPAP. Satisfied with current treatment? yes Duration of hypertension: chronic BP monitoring frequency: not checking BP range:  BP medication side effects: no Duration of hyperlipidemia: chronic Cholesterol medication side effects: no Cholesterol supplements: none Medication compliance: good compliance Aspirin: no Recent stressors: no Recurrent headaches: no Visual changes:  no Palpitations: no Dyspnea: no Chest pain: no Lower extremity edema: no Dizzy/lightheaded: no   ASTHMA Continues Qvar 2 puffs BID + Albuterol PRN. Asthma status: stable Satisfied with current treatment?: yes Albuterol/rescue inhaler frequency: once a month or less Dyspnea frequency: no Wheezing frequency: no Cough frequency: occasional Nocturnal symptom frequency: no Limitation of activity: no Current upper respiratory symptoms: no Triggers: pollen Home peak flows: none Aerochamber/spacer use: no Visits to ER or Urgent Care in past year: no Pneumovax:  refused Influenza:  refused    DEPRESSION Continues on Celexa and Trazodone.  Had visits with [facility] in past, but PCP took over this.  Currently has no power, unable to pay bill -- is >$1000.  Currently lives in trailer and landlord has given them a timeline even though rent is up to date.  Her mother lives with them as well, has a bad heart. Mood status: exacerbated due to stressors Satisfied with current treatment?: yes Symptom severity: moderate  Duration of current treatment : chronic Side effects: no Medication compliance: good compliance Psychotherapy/counseling: none Depressed mood: yes Anxious mood: yes Anhedonia: no Significant weight loss or gain: no Insomnia: yes hard to fall asleep Fatigue: yes Feelings of worthlessness or guilt: no Impaired concentration/indecisiveness: no Suicidal ideations: no Hopelessness: no Crying spells: yes    12/26/2022    3:50 PM 09/25/2022    2:28 PM 06/26/2022    1:10 PM 02/20/2022    2:31 PM 11/18/2021    4:08 PM  Depression screen PHQ 2/9  Decreased Interest 1 0 0 0 0  Down, Depressed, Hopeless 1 1 1  0 0  PHQ - 2 Score 2 1 1  0 0  Altered sleeping 2 1 1 1 2   Tired, decreased energy 2 1 2 3 1   Change in appetite 0 0 0 0 0  Feeling bad or failure about yourself  0 0 0 0 0  Trouble concentrating 0 0 1 0 1  Moving slowly or fidgety/restless 0 0 0 0 0  Suicidal thoughts 0 0 0 0 0   PHQ-9 Score 6 3 5 4 4   Difficult doing work/chores Not difficult at all Not difficult at all Not difficult at all Not difficult at all Not difficult at all       12/26/2022    3:50 PM 09/25/2022    2:28 PM 06/26/2022    1:11 PM 02/20/2022    2:31 PM  GAD 7 : Generalized Anxiety Score  Nervous, Anxious, on Edge 1 1 2 1   Control/stop worrying 1 1 1 2   Worry too much - different things 3 1 1 1   Trouble relaxing 2 0 1 0  Restless 1 0 1 0  Easily annoyed or irritable 1 1 2 1   Afraid - awful might happen 0 1 0 0  Total GAD 7 Score 9 5 8 5   Anxiety Difficulty Not difficult at all Not difficult at all Not difficult at all Not difficult at all   Relevant past medical, surgical, family and social history reviewed and updated as indicated. Interim medical history since our last visit reviewed. Allergies and medications reviewed and updated.  Review of Systems  Constitutional:  Negative for activity change, appetite change, diaphoresis, fatigue and fever.  Respiratory:  Negative for cough, chest tightness, shortness of breath and wheezing.   Cardiovascular:  Negative for chest pain, palpitations and leg swelling.  Gastrointestinal: Negative.   Endocrine: Negative for cold intolerance, heat intolerance, polydipsia, polyphagia and polyuria.  Neurological: Negative.   Psychiatric/Behavioral:  Negative for decreased concentration, self-injury, sleep disturbance and suicidal ideas. The patient is nervous/anxious.    Per HPI unless specifically indicated above     Objective:    BP 112/75   Pulse 80   Temp 98.2 F (36.8 C) (Oral)   Ht 5\' 2"  (1.575 m)   Wt 206 lb 3.2 oz (93.5 kg)   BMI 37.71 kg/m   Wt Readings from Last 3 Encounters:  12/26/22 206 lb 3.2 oz (93.5 kg)  09/25/22 201 lb (91.2 kg)  09/16/22 205 lb (93 kg)    Physical Exam Vitals and nursing note reviewed.  Constitutional:      General: She is awake. She is not in acute distress.    Appearance: She is well-developed and  well-groomed. She is obese. She is not ill-appearing or toxic-appearing.  HENT:     Head: Normocephalic.     Right Ear: Hearing normal.     Left Ear: Hearing normal.  Eyes:     General: Lids are normal.        Right eye: No discharge.        Left eye: No discharge.     Pupils: Pupils are equal, round, and reactive to light.     Funduscopic exam:    Right eye: No hemorrhage.        Left eye: No hemorrhage.  Neck:     Thyroid: No thyromegaly.     Vascular: No carotid bruit.  Cardiovascular:     Rate and  Rhythm: Normal rate and regular rhythm.     Heart sounds: Murmur heard.     Systolic murmur is present with a grade of 3/6.     No gallop.  Pulmonary:     Effort: Pulmonary effort is normal. No accessory muscle usage or respiratory distress.     Breath sounds: Normal breath sounds.  Abdominal:     General: Bowel sounds are normal.     Palpations: Abdomen is soft.  Musculoskeletal:     Cervical back: Normal range of motion and neck supple.     Right lower leg: No edema.     Left lower leg: No edema.  Lymphadenopathy:     Head:     Right side of head: No submental, submandibular, tonsillar, preauricular or posterior auricular adenopathy.     Left side of head: No submental, submandibular, tonsillar, preauricular or posterior auricular adenopathy.     Cervical: No cervical adenopathy.  Skin:    General: Skin is warm and dry.  Neurological:     Mental Status: She is alert and oriented to person, place, and time.  Psychiatric:        Attention and Perception: Attention normal.        Mood and Affect: Mood normal. Affect is tearful.        Speech: Speech normal.        Behavior: Behavior normal. Behavior is cooperative.        Thought Content: Thought content normal.    Results for orders placed or performed in visit on 09/25/22  Bayer DCA Hb A1c Waived  Result Value Ref Range   HB A1C (BAYER DCA - WAIVED) 7.3 (H) 4.8 - 5.6 %      Assessment & Plan:   Problem List Items  Addressed This Visit       Cardiovascular and Mediastinum   Hypertension associated with diabetes (HCC)    Chronic, stable.  BP well below goal in office.  Currently on Lisinopril for kidney protection and BP, may benefit from switch to ARB in future due to her underlying asthma.  Recommend she monitor BP at least a few mornings a week at home and document.  DASH diet at home.  Labs today: A1c and CMP.  Urine ALB 80 April 2024.  Return in 3 months.       Relevant Orders   Comprehensive metabolic panel   HgB A1c     Respiratory   Mild persistent asthma without complication    Chronic, ongoing, and stable with inhaler use at this time.  Continue current inhaler regimen as ordered and adjust as needed.  Plan on spirometry at future visit.  Continue modest weight loss.  Labs today: up to date.      Obstructive sleep apnea of adult    Reports she was told she did not need CPAP, but to focus on weight loss -- may benefit repeat study in future.        Endocrine   Type 2 diabetes mellitus with diabetic polyneuropathy, with long-term current use of insulin (HCC)    Chronic, ongoing with A1c last visit 7.3%, previous 7.1%, mild trend up.  Suspect it may be trending up more this check due to stressors at present.  Recheck A1c today.  Continue Gabapentin and B12 for neuropathy.   - Continue Ozempic 2 MG weekly (however if A1c still >7%, will attempt to switch to [facility] again and see if supply available) and continue [place] daily with goal in  future to reduce to discontinue insulin, discussed with patient.  At this time continue Glipizide 10 MG BID and Lantus 30 units, discussed with her that if lows present she is to reduce this by 3 units every 3 days if needed = if BS in morning <130 consistently.   - Consider transition to [facility] in future if available and if still not <7% on max dose GLP1.  Discussed with patient and she is interested in this change if needed. - Check BS TID and document  for visits.   - Up to date on eye and foot exam - On ACE and Statin - Refuses PCV20 and Flu - Return in 3 months.      Relevant Medications   busPIRone (BUSPAR) 5 MG tablet   Other Relevant Orders   HgB A1c   Hyperlipidemia associated with type 2 diabetes mellitus (HCC)    Chronic, ongoing.  Continue current medication regimen and adjust as needed.  Lipid panel today.      Relevant Orders   Comprehensive metabolic panel   Lipid Panel w/o Chol/HDL Ratio   HgB A1c   Type 2 diabetes mellitus with proteinuria (HCC) - Primary    Refer to diabetes with neuropathy plan of care and continue Lisinopril for kidney protection. Urine ALB 80 April 2024.      Relevant Orders   HgB A1c     Nervous and Auditory   Vitamin B12 deficiency neuropathy (HCC)    Ongoing.  Continue OTC supplement which is offering benefit to her neuropathy.  Recheck level next visit.      Relevant Medications   busPIRone (BUSPAR) 5 MG tablet     Other   Anxiety    Chronic, exacerbated due to stressors. Was followed by [facility] in past, but PCP currently fills prescriptions.  Will return to them if any worsening mood.  Denies SI/HI.  Continue current medication regimen which offers benefit to her, but add on Buspar 5 MG BID due to current increased stressors.      Relevant Medications   busPIRone (BUSPAR) 5 MG tablet   Heart murmur, systolic    Ongoing with history of work-up in past.  More prominent on exam today, had ordered repeat echo to check on this but she missed visit, will place new order next visit  She denies any symptoms.      Morbid obesity (HCC)    BMI 37.71 with T2DM, HLD, HTN.  Recommended eating smaller high protein, low fat meals more frequently and exercising 30 mins a day 5 times a week with a goal of 10-15lb weight loss in the next 3 months. Patient voiced their understanding and motivation to adhere to these recommendations.       Other Visit Diagnoses     Financial difficulties        Referral to social worker placed for assistance anc advised her if any help needed to alert PCP.   Relevant Orders   Ambulatory referral to Social Work        Follow up plan: Return in about 3 months (around 03/28/2023) for T2DM, HTN/HLD, MOOD, OSA -- may be sooner dependent on A1c.

## 2022-12-26 NOTE — Assessment & Plan Note (Signed)
Chronic, exacerbated due to stressors. Was followed by [facility] in past, but PCP currently fills prescriptions.  Will return to them if any worsening mood.  Denies SI/HI.  Continue current medication regimen which offers benefit to her, but add on Buspar 5 MG BID due to current increased stressors.

## 2022-12-26 NOTE — Assessment & Plan Note (Signed)
Refer to diabetes with neuropathy plan of care and continue Lisinopril for kidney protection. Urine ALB 80 April 2024.

## 2022-12-26 NOTE — Assessment & Plan Note (Signed)
BMI 37.71 with T2DM, HLD, HTN.  Recommended eating smaller high protein, low fat meals more frequently and exercising 30 mins a day 5 times a week with a goal of 10-15lb weight loss in the next 3 months. Patient voiced their understanding and motivation to adhere to these recommendations.

## 2022-12-26 NOTE — Assessment & Plan Note (Signed)
Chronic, ongoing.  Continue current medication regimen and adjust as needed. Lipid panel today. 

## 2022-12-26 NOTE — Assessment & Plan Note (Signed)
Ongoing with history of work-up in past.  More prominent on exam today, had ordered repeat echo to check on this but she missed visit, will place new order next visit  She denies any symptoms.

## 2022-12-26 NOTE — Assessment & Plan Note (Signed)
Chronic, stable.  BP well below goal in office.  Currently on Lisinopril for kidney protection and BP, may benefit from switch to ARB in future due to her underlying asthma.  Recommend she monitor BP at least a few mornings a week at home and document.  DASH diet at home.  Labs today: A1c and CMP.  Urine ALB 80 April 2024.  Return in 3 months.

## 2022-12-26 NOTE — Assessment & Plan Note (Signed)
Chronic, ongoing, and stable with inhaler use at this time.  Continue current inhaler regimen as ordered and adjust as needed.  Plan on spirometry at future visit.  Continue modest weight loss.  Labs today: up to date.

## 2022-12-26 NOTE — Assessment & Plan Note (Signed)
Reports she was told she did not need CPAP, but to focus on weight loss -- may benefit repeat study in future. °

## 2022-12-26 NOTE — Assessment & Plan Note (Signed)
Chronic, ongoing with A1c last visit 7.3%, previous 7.1%, mild trend up.  Suspect it may be trending up more this check due to stressors at present.  Recheck A1c today.  Continue Gabapentin and B12 for neuropathy.   - Continue Ozempic 2 MG weekly (however if A1c still >7%, will attempt to switch to [facility] again and see if supply available) and continue [place] daily with goal in future to reduce to discontinue insulin, discussed with patient.  At this time continue Glipizide 10 MG BID and Lantus 30 units, discussed with her that if lows present she is to reduce this by 3 units every 3 days if needed = if BS in morning <130 consistently.   - Consider transition to [facility] in future if available and if still not <7% on max dose GLP1.  Discussed with patient and she is interested in this change if needed. - Check BS TID and document for visits.   - Up to date on eye and foot exam - On ACE and Statin - Refuses PCV20 and Flu - Return in 3 months.

## 2022-12-26 NOTE — Assessment & Plan Note (Signed)
Ongoing.  Continue OTC supplement which is offering benefit to her neuropathy.  Recheck level next visit.

## 2022-12-27 ENCOUNTER — Telehealth: Payer: Self-pay | Admitting: *Deleted

## 2022-12-27 ENCOUNTER — Other Ambulatory Visit: Payer: Self-pay | Admitting: Nurse Practitioner

## 2022-12-27 LAB — COMPREHENSIVE METABOLIC PANEL
ALT: 18 [IU]/L (ref 0–32)
AST: 17 [IU]/L (ref 0–40)
Albumin: 4.3 g/dL (ref 3.9–4.9)
Alkaline Phosphatase: 68 [IU]/L (ref 44–121)
BUN/Creatinine Ratio: 22 (ref 9–23)
BUN: 17 mg/dL (ref 6–24)
Bilirubin Total: 0.3 mg/dL (ref 0.0–1.2)
CO2: 22 mmol/L (ref 20–29)
Calcium: 9.4 mg/dL (ref 8.7–10.2)
Chloride: 104 mmol/L (ref 96–106)
Creatinine, Ser: 0.77 mg/dL (ref 0.57–1.00)
Globulin, Total: 2.9 g/dL (ref 1.5–4.5)
Glucose: 134 mg/dL — ABNORMAL HIGH (ref 70–99)
Potassium: 4.4 mmol/L (ref 3.5–5.2)
Sodium: 141 mmol/L (ref 134–144)
Total Protein: 7.2 g/dL (ref 6.0–8.5)
eGFR: 97 mL/min/{1.73_m2} (ref >59)

## 2022-12-27 LAB — HEMOGLOBIN A1C
Est. average glucose Bld gHb Est-mCnc: 169 mg/dL
Hgb A1c MFr Bld: 7.5 % — ABNORMAL HIGH (ref 4.8–5.6)

## 2022-12-27 LAB — LIPID PANEL W/O CHOL/HDL RATIO
Cholesterol, Total: 162 mg/dL (ref 100–199)
HDL: 54 mg/dL (ref >39)
LDL Chol Calc (NIH): 83 mg/dL (ref 0–99)
Triglycerides: 144 mg/dL (ref 0–149)
VLDL Cholesterol Cal: 25 mg/dL (ref 5–40)

## 2022-12-27 MED ORDER — TIRZEPATIDE 5 MG/0.5ML ~~LOC~~ SOAJ: 5.0000 mg | SUBCUTANEOUS | 0 refills | Status: AC

## 2022-12-27 MED ORDER — TIRZEPATIDE 10 MG/0.5ML ~~LOC~~ SOAJ: 10.0000 mg | SUBCUTANEOUS | 12 refills | Status: DC

## 2022-12-27 NOTE — Progress Notes (Signed)
Contacted via MyChart   Good morning Michelle Warner, your labs have returned: - Kidney function, creatinine and eGFR, remains normal, as is liver function, AST and ALT.  - A1c has trended up a little more from 7.3% to 7.5%, as discussed I am going to send in [place] and see if we can get it, if so then stop Ozempic and start Mounjaro.  Any questions?  I have sent Mounjaro to pharmacy and we will cross our fingers and toes.   Keep being stellar!!  Thank you for allowing me to participate in your care.  I appreciate you. Kindest regards, Adarian Bur

## 2022-12-27 NOTE — Telephone Encounter (Signed)
Pt given lab results per notes of J. [name], NP from 12/27/22 on 12/27/22. Pt verbalized understanding :  Good morning Michelle Warner, your labs have returned: - Kidney function, creatinine and eGFR, remains normal, as is liver function, AST and ALT. - A1c has trended up a little more from 7.3% to 7.5%, as discussed I am going to send in [place] and see if we can get it, if so then stop Ozempic and start Mounjaro.  Any questions?  I have sent Mounjaro to pharmacy and we will cross our fingers and toes.   Keep being stellar!!  Thank you for allowing me to participate in your care.  I appreciate you. Kindest regards, Jolene  Patient will call back if she can not get Mounjaro.

## 2023-01-01 ENCOUNTER — Other Ambulatory Visit: Payer: BLUE CROSS/BLUE SHIELD

## 2023-01-01 NOTE — Patient Outreach (Signed)
Medicaid Managed Care Social Work Note  01/01/2023 Name:  Michelle Warner MRN:  [id number] DOB:  1975/09/26  Michelle Warner is an 47 y.o. year old female who is a primary patient of [name], Dorie Rank, NP.  The Medicaid Managed Care Coordination team was consulted for assistance with:  Community Resources   [name] was given information about Medicaid Managed Care Coordination team services today. Michelle Warner Patient agreed to services and verbal consent obtained.  Engaged with patient  for by telephone forinitial visit in response to referral for case management and/or care coordination services.   Assessments/Interventions:  Review of past medical history, allergies, medications, health status, including review of consultants reports, laboratory and other test data, was performed as part of comprehensive evaluation and provision of chronic care management services.  SDOH: (Social Determinant of Health) assessments and interventions performed: SDOH Interventions    Flowsheet Row Office Visit from 12/26/2022 in [facility] Office Visit from 11/18/2021 in [facility] from [facility]  SDOH Interventions     Depression Interventions/Treatment  Medication, Currently on Treatment Currently on Treatment, Medication Currently on Treatment     BSW completed a telephone outreach with patient, she states they are in need of assistance with the utility bill. Power has been off for 5 months and they owe a little over 3,900. Patient states she does not have any income. Her mother stays with her and pays the rent and her husband works. Patient states they were using a generator but the neighbors were complaining. BSW will email utility resources to [email or web address] [email or web address]. Patient and husband state they can come up with 1200 which would leave 2700. BSW asked if [employer] would let them pay 1200 and set  up a payment plan, patient stated no they have to pay the full amount.  Advanced Directives Status:  Not addressed in this encounter.  Care Plan                 Allergies  Allergen Reactions   Egg Solids, Whole Nausea And Vomiting   Metformin Diarrhea    Multiple attempts at initiation - diarrhea    Medications Reviewed Today   Medications were not reviewed in this encounter     Patient Active Problem List   Diagnosis Date Noted   Heart murmur, systolic 02/20/2022   Type O blood, Rh positive 08/07/2021   Vitamin B12 deficiency neuropathy (HCC) 06/05/2021   Type 2 diabetes mellitus with both eyes affected by mild nonproliferative retinopathy without macular edema, with long-term current use of insulin (HCC) 05/05/2021   Type 2 diabetes mellitus with proteinuria (HCC) 05/05/2021   Chronic bilateral low back pain without sciatica 06/27/2019   Anxiety 07/24/2018   Mild persistent asthma without complication 07/24/2018   Type 2 diabetes mellitus with diabetic polyneuropathy, with long-term current use of insulin (HCC) 07/24/2018   Vitamin D deficiency 07/24/2018   IUD (intrauterine device) in place 06/10/2015   Uterine leiomyoma 06/10/2015   Obstructive sleep apnea of adult 08/20/2014   Morbid obesity (HCC) 11/26/2007   Allergic rhinitis, seasonal 09/04/2007   Migraine without aura and responsive to treatment 05/01/2007   Gastro-esophageal reflux disease without esophagitis 09/04/2006   Hypertension associated with diabetes (HCC) 09/04/2006   Hyperlipidemia associated with type 2 diabetes mellitus (HCC) 09/04/2006    Conditions to be addressed/monitored per PCP order:   community resources  There are no care plans  that you recently modified to display for this patient.   Follow up:  Patient agrees to Care Plan and Follow-up.  Plan: The Managed Medicaid care management team will reach out to the patient again over the next 30 days.  Date/time of next scheduled Social Work  care management/care coordination outreach:  02/05/23 Gus Puma, Kenard Gower, [facility]  Managed [facility] Social Worker [phone]

## 2023-01-01 NOTE — Patient Instructions (Signed)
Tailored Plan Medicaid On July 1, some people on [place] Medicaid will move to a new kind of Medicaid health plan called a Tailored Plan. Tailored Plans cover your doctor visits, prescription drugs, and health care services.    If your [place] Medicaid will move to a Tailored Plan, you should have gotten a letter and welcome packet. If you're not sure, call your [place] Medicaid Enrollment Broker at [phone] and ask.  Check out these free materials, in [place] and [place], to learn more about your Tailored Plan: [email or web address]/Tailored-Plans/Toolkit  Tailored Care Management Services  TCM services are available to you now. If you are a Tailored Plan member or will be and want information about Tailored Care Management Services including rides to appointments and community and home services, call the Care Management provider for your county of residence:    [facility] ([place], [place])  Member Services: [phone] Behavioral Health Crisis Line: [phone], [place], [place], [place], [place])  Member Services: [phone] Behavioral Health Crisis Line: [phone]  [facility] Renard Hamper) Member Services: [phone] Behavioral Health Crisis Line: [phone]

## 2023-01-11 ENCOUNTER — Encounter: Payer: Self-pay | Admitting: Emergency Medicine

## 2023-01-11 ENCOUNTER — Other Ambulatory Visit: Payer: Self-pay

## 2023-01-11 ENCOUNTER — Emergency Department
Admission: EM | Admit: 2023-01-11 | Discharge: 2023-01-11 | Disposition: A | Discharge status: Home / Self Care | Payer: BLUE CROSS/BLUE SHIELD | Attending: Emergency Medicine | Admitting: Emergency Medicine

## 2023-01-11 ENCOUNTER — Telehealth: Payer: Self-pay | Admitting: Emergency Medicine

## 2023-01-11 DIAGNOSIS — R197 Diarrhea, unspecified: Secondary | ICD-10-CM | POA: Diagnosis present

## 2023-01-11 DIAGNOSIS — Z20822 Contact with and (suspected) exposure to covid-19: Secondary | ICD-10-CM | POA: Insufficient documentation

## 2023-01-11 DIAGNOSIS — I10 Essential (primary) hypertension: Secondary | ICD-10-CM | POA: Diagnosis not present

## 2023-01-11 DIAGNOSIS — J4530 Mild persistent asthma, uncomplicated: Secondary | ICD-10-CM | POA: Diagnosis not present

## 2023-01-11 DIAGNOSIS — E1142 Type 2 diabetes mellitus with diabetic polyneuropathy: Secondary | ICD-10-CM | POA: Insufficient documentation

## 2023-01-11 LAB — GASTROINTESTINAL PANEL BY PCR, STOOL (REPLACES STOOL CULTURE)

## 2023-01-11 LAB — CBC WITH DIFFERENTIAL/PLATELET
Abs Immature Granulocytes: 0.03 10*3/uL (ref 0.00–0.07)
Basophils Absolute: 0 10*3/uL (ref 0.0–0.1)
Basophils Relative: 0 %
Eosinophils Absolute: 0.1 10*3/uL (ref 0.0–0.5)
Eosinophils Relative: 2 %
HCT: 43.9 % (ref 36.0–46.0)
Hemoglobin: 14.3 g/dL (ref 12.0–15.0)
Immature Granulocytes: 0 %
Lymphocytes Relative: 27 %
Lymphs Abs: 2.4 10*3/uL (ref 0.7–4.0)
MCH: 30.2 pg (ref 26.0–34.0)
MCHC: 32.6 g/dL (ref 30.0–36.0)
MCV: 92.6 fL (ref 80.0–100.0)
Monocytes Absolute: 0.4 10*3/uL (ref 0.1–1.0)
Monocytes Relative: 5 %
Neutro Abs: 5.7 10*3/uL (ref 1.7–7.7)
Neutrophils Relative %: 66 %
Platelets: 323 10*3/uL (ref 150–400)
RBC: 4.74 MIL/uL (ref 3.87–5.11)
RDW: 11.9 % (ref 11.5–15.5)
WBC: 8.8 10*3/uL (ref 4.0–10.5)
nRBC: 0 % (ref 0.0–0.2)

## 2023-01-11 LAB — RESP PANEL BY RT-PCR (RSV, FLU A&B, COVID)  RVPGX2
Influenza A by PCR: NEGATIVE
Influenza B by PCR: NEGATIVE
Resp Syncytial Virus by PCR: NEGATIVE
SARS Coronavirus 2 by RT PCR: NEGATIVE

## 2023-01-11 LAB — URINALYSIS, ROUTINE W REFLEX MICROSCOPIC
Bacteria, UA: NONE SEEN
Bilirubin Urine: NEGATIVE
Glucose, UA: >=500 mg/dL — AB
Ketones, ur: NEGATIVE mg/dL
Leukocytes,Ua: NEGATIVE
Nitrite: NEGATIVE
Protein, ur: NEGATIVE mg/dL
Specific Gravity, Urine: 1.032 — ABNORMAL HIGH (ref 1.005–1.030)
pH: 5 (ref 5.0–8.0)

## 2023-01-11 LAB — COMPREHENSIVE METABOLIC PANEL
ALT: 18 U/L (ref 0–44)
AST: 19 U/L (ref 15–41)
Albumin: 4.2 g/dL (ref 3.5–5.0)
Alkaline Phosphatase: 68 U/L (ref 38–126)
Anion gap: 11 (ref 5–15)
BUN: 16 mg/dL (ref 6–20)
CO2: 21 mmol/L — ABNORMAL LOW (ref 22–32)
Calcium: 8.5 mg/dL — ABNORMAL LOW (ref 8.9–10.3)
Chloride: 102 mmol/L (ref 98–111)
Creatinine, Ser: 0.88 mg/dL (ref 0.44–1.00)
GFR, Estimated: >60 mL/min (ref >60)
Glucose, Bld: 232 mg/dL — ABNORMAL HIGH (ref 70–99)
Potassium: 3.9 mmol/L (ref 3.5–5.1)
Sodium: 134 mmol/L — ABNORMAL LOW (ref 135–145)
Total Bilirubin: 0.5 mg/dL (ref 0.3–1.2)
Total Protein: 7.8 g/dL (ref 6.5–8.1)

## 2023-01-11 LAB — C DIFFICILE QUICK SCREEN W PCR REFLEX
C Diff antigen: NEGATIVE
C Diff interpretation: NOT DETECTED
C Diff toxin: NEGATIVE

## 2023-01-11 LAB — LIPASE, BLOOD: Lipase: 28 U/L (ref 11–51)

## 2023-01-11 MED ORDER — AZITHROMYCIN 500 MG PO TABS: 1000.0000 mg | ORAL_TABLET | Freq: Once | ORAL | 0 refills | Status: AC

## 2023-01-11 NOTE — ED Triage Notes (Signed)
Diarrhea x 5 days; Has not been around anyone else that has been sick; Denies NV; Denies urinary symptoms

## 2023-01-11 NOTE — Telephone Encounter (Signed)
Reviewed GI panel with patient, azithromycin single dose ordered for her pharmacy.

## 2023-01-11 NOTE — Discharge Instructions (Signed)
Your blood work and urinalysis are normal.  We will notify you if there are abnormalities in your stool culture.  You may take things like Imodium per package instructions to help with your symptoms.  Please return for any new, worsening, or change in symptoms or other concerns.  Please remember to stay hydrated.  It was a pleasure caring for you today.

## 2023-01-11 NOTE — ED Provider Notes (Signed)
[facility] Provider Note    Event Date/Time   First MD Initiated Contact with Patient 01/11/23 1601     (approximate)   History   Diarrhea   HPI  Michelle Warner is a 47 y.o. female with a past medical history of type 2 diabetes, anxiety, asthma, obesity, GERD who presents today for evaluation of diarrhea for the past 5 days.  Patient reports that she is going approximately 5 times per day.  She does not have any associated abdominal pain.  She has not had any fevers or chills.  She does not have any vomiting.  She has not noticed any blood in her stool.  She has not been on any antibiotics recently.  She denies any recent travel.  She denies any known sick contacts.  Patient Active Problem List   Diagnosis Date Noted   Heart murmur, systolic 02/20/2022   Type O blood, Rh positive 08/07/2021   Vitamin B12 deficiency neuropathy (HCC) 06/05/2021   Type 2 diabetes mellitus with both eyes affected by mild nonproliferative retinopathy without macular edema, with long-term current use of insulin (HCC) 05/05/2021   Type 2 diabetes mellitus with proteinuria (HCC) 05/05/2021   Chronic bilateral low back pain without sciatica 06/27/2019   Anxiety 07/24/2018   Mild persistent asthma without complication 07/24/2018   Type 2 diabetes mellitus with diabetic polyneuropathy, with long-term current use of insulin (HCC) 07/24/2018   Vitamin D deficiency 07/24/2018   IUD (intrauterine device) in place 06/10/2015   Uterine leiomyoma 06/10/2015   Obstructive sleep apnea of adult 08/20/2014   Morbid obesity (HCC) 11/26/2007   Allergic rhinitis, seasonal 09/04/2007   Migraine without aura and responsive to treatment 05/01/2007   Gastro-esophageal reflux disease without esophagitis 09/04/2006   Hypertension associated with diabetes (HCC) 09/04/2006   Hyperlipidemia associated with type 2 diabetes mellitus (HCC) 09/04/2006          Physical Exam   Triage Vital Signs: ED  Triage Vitals [01/11/23 1301]  Encounter Vitals Group     BP (!) 140/97     Systolic BP Percentile      Diastolic BP Percentile      Pulse Rate 97     Resp 20     Temp 98.1 F (36.7 C)     Temp Source Oral     SpO2 98 %     Weight      Height      Head Circumference      Peak Flow      Pain Score      Pain Loc      Pain Education      Exclude from Growth Chart     Most recent vital signs: Vitals:   01/11/23 1301 01/11/23 1654  BP: (!) 140/97 138/88  Pulse: 97 90  Resp: 20 20  Temp: 98.1 F (36.7 C) 98 F (36.7 C)  SpO2: 98% 99%    Physical Exam Vitals and nursing note reviewed.  Constitutional:      General: Awake and alert. No acute distress.    Appearance: Normal appearance. The patient is obese.  HENT:     Head: Normocephalic and atraumatic.     Mouth: Mucous membranes are moist.  Eyes:     General: PERRL. Normal EOMs        Right eye: No discharge.        Left eye: No discharge.     Conjunctiva/sclera: Conjunctivae normal.  Cardiovascular:  Rate and Rhythm: Normal rate and regular rhythm.     Pulses: Normal pulses.  Pulmonary:     Effort: Pulmonary effort is normal. No respiratory distress.     Breath sounds: Normal breath sounds.  Abdominal:     Abdomen is soft. There is no abdominal tenderness. No rebound or guarding. No distention. Musculoskeletal:        General: No swelling. Normal range of motion.     Cervical back: Normal range of motion and neck supple.  Skin:    General: Skin is warm and dry.     Capillary Refill: Capillary refill takes less than 2 seconds.     Findings: No rash.  Neurological:     Mental Status: The patient is awake and alert.      ED Results / Procedures / Treatments   Labs (all labs ordered are listed, but only abnormal results are displayed) Labs Reviewed  COMPREHENSIVE METABOLIC PANEL - Abnormal; Notable for the following components:      Result Value   Sodium 134 (*)    CO2 21 (*)    Glucose, Bld 232 (*)     Calcium 8.5 (*)    All other components within normal limits  URINALYSIS, ROUTINE W REFLEX MICROSCOPIC - Abnormal; Notable for the following components:   Color, Urine STRAW (*)    APPearance HAZY (*)    Specific Gravity, Urine 1.032 (*)    Glucose, UA >=500 (*)    Hgb urine dipstick SMALL (*)    All other components within normal limits  RESP PANEL BY RT-PCR (RSV, FLU A&B, COVID)  RVPGX2  GASTROINTESTINAL PANEL BY PCR, STOOL (REPLACES STOOL CULTURE)  C DIFFICILE QUICK SCREEN W PCR REFLEX    LIPASE, BLOOD  CBC WITH DIFFERENTIAL/PLATELET     EKG     RADIOLOGY     PROCEDURES:  Critical Care performed:   Procedures   MEDICATIONS ORDERED IN ED: Medications - No data to display   IMPRESSION / MDM / ASSESSMENT AND PLAN / ED COURSE  I reviewed the triage vital signs and the nursing notes.   Differential diagnosis includes, but is not limited to, gastroenteritis, dehydration, electrolyte disarray.  Patient is awake and alert, hemodynamically stable and afebrile.  Her abdomen is completely soft and nontender throughout.  Labs obtained in triage are overall reassuring, no leukocytosis, no significant electrolyte disarray or dehydration.  Urinalysis does not demonstrate evidence of infection.  Patient is overall well-appearing. No abdominal pain or tenderness. Vomiting and diarrhea are non-bloody. Patient is hemodynamically stable. No history of immunosuppression, no other red flags such as recent travel, sick contacts or recent antibiotic use. Improved with treatment in the emergency department and tolerating oral intake. Differential diagnosis is broad however  without focal abdominal tenderness and given improvement, no concern that the patient requires urgent imaging or has surgical process in abdomen. Given that patient has a paucity of red flags for the vomiting and diarrhea as it pertains to patient's past medical history and history of present illness, no indication for  further observation or empiric antibiotic treatment. She was able to provide a stool sample, patient was discharged prior to results returned, we were advised that we will notify her if they are abnormal.  We discussed symptomatic management.  Discussed care plan, return precautions, and advised close outpatient follow-up. Patient understands and agrees with plan.  She was discharged in stable condition.  Dr. Modesto Charon also made aware of patient and will follow-up on stool results  and call patient back if necessary.   Patient's presentation is most consistent with acute complicated illness / injury requiring diagnostic workup.      FINAL CLINICAL IMPRESSION(S) / ED DIAGNOSES   Final diagnoses:  Diarrhea, unspecified type     Rx / DC Orders   ED Discharge Orders     None        Note:  This document was prepared using Dragon voice recognition software and may include unintentional dictation errors.   Keturah Shavers 01/11/23 Rivka Barbara, MD 01/11/23 2209

## 2023-02-04 ENCOUNTER — Other Ambulatory Visit: Payer: Self-pay | Admitting: Nurse Practitioner

## 2023-02-05 ENCOUNTER — Other Ambulatory Visit: Payer: Self-pay

## 2023-02-05 NOTE — Patient Instructions (Signed)
 Tailored Plan Medicaid On July 1, some people on [place] Medicaid will move to a new kind of Medicaid health plan called a Tailored Plan. Tailored Plans cover your doctor visits, prescription drugs, and health care services.    If your [place] Medicaid will move to a Tailored Plan, you should have gotten a letter and welcome packet. If you're not sure, call your [place] Medicaid Enrollment Broker at [phone] and ask.  Check out these free materials, in [place] and [place], to learn more about your Tailored Plan: [email or web address]/Tailored-Plans/Toolkit  Tailored Care Management Services  TCM services are available to you now. If you are a Tailored Plan member or will be and want information about Tailored Care Management Services including rides to appointments and community and home services, call the Care Management provider for your county of residence:    [facility] ([place], [place])  Member Services: [phone] Behavioral Health Crisis Line: [phone], [place], [place], [place], [place])  Member Services: [phone] Behavioral Health Crisis Line: [phone]  [facility]) Member Services: [phone] Behavioral Health Crisis Line: [phone]

## 2023-02-05 NOTE — Telephone Encounter (Signed)
Requested medication (s) are due for refill today:yes  Requested medication (s) are on the active medication list: yes  Last refill:  12/08/21 100 each 11 RF  Future visit scheduled: yes  Notes to clinic:  requesting BD Pen needles- needs to be ordered   Requested Prescriptions  Pending Prescriptions Disp Refills   BD PEN NEEDLE NANO 2ND GEN 32G X 4 MM MISC [Pharmacy Med Name: BD PEN NEEDLE/NANO 32GX4MM MIS] 100 each 0    Sig: USE 1 PEN NEEDLE ONCE DAILY     Endocrinology: Diabetes - Testing Supplies Passed - 02/04/2023  4:27 PM      Passed - Valid encounter within last 12 months    Recent Outpatient Visits           1 month ago Type 2 diabetes mellitus with proteinuria (HCC)   [facility] [place], [place] T, NP   4 months ago Type 2 diabetes mellitus with proteinuria (HCC)   [place] [facility] [place], [place] T, NP   7 months ago Type 2 diabetes mellitus with proteinuria (HCC)   [place] [facility] [place], [place] T, NP   11 months ago Type 2 diabetes mellitus with proteinuria (HCC)   [place] [facility] [place], Corrie Dandy T, NP   1 year ago Type 2 diabetes mellitus with proteinuria (HCC)   [place] [facility] [place], Dorie Rank, NP       Future Appointments             In 1 month [name], Dorie Rank, NP [place] [facility]

## 2023-02-05 NOTE — Patient Outreach (Signed)
  Medicaid Managed Care Social Work Note  02/05/2023 Name:  Michelle Warner MRN:  [id number] DOB:  Dec 07, 1975  Michelle Warner is an 47 y.o. year old female who is a primary patient of [name], Dorie Rank, NP.  The Medicaid Managed Care Coordination team was consulted for assistance with:  Community Resources   Ms. Difede was given information about Medicaid Managed Care Coordination team services today. Michelle Warner Patient agreed to services and verbal consent obtained.  Engaged with patient  for by telephone forfollow up visit in response to referral for case management and/or care coordination services.   Assessments/Interventions:  Review of past medical history, allergies, medications, health status, including review of consultants reports, laboratory and other test data, was performed as part of comprehensive evaluation and provision of chronic care management services.  SDOH: (Social Determinant of Health) assessments and interventions performed: SDOH Interventions    Flowsheet Row Office Visit from 12/26/2022 in [facility] Office Visit from 11/18/2021 in [facility] from [facility]/15/2018 in [facility]  SDOH Interventions     Depression Interventions/Treatment  Medication, Currently on Treatment Currently on Treatment, Medication Currently on Treatment     BSW completed a telephone outreach with patient, she states she did receive the resources BSW sent her but they were unable to assist, Patient states they will continue to save more money to pay it down.   Advanced Directives Status:  Not addressed in this encounter.  Care Plan                 Allergies  Allergen Reactions   Egg Solids, Whole Nausea And Vomiting   Metformin Diarrhea    Multiple attempts at initiation - diarrhea    Medications Reviewed Today   Medications were not reviewed in this encounter     Patient Active Problem List    Diagnosis Date Noted   Heart murmur, systolic 02/20/2022   Type O blood, Rh positive 08/07/2021   Vitamin B12 deficiency neuropathy (HCC) 06/05/2021   Type 2 diabetes mellitus with both eyes affected by mild nonproliferative retinopathy without macular edema, with long-term current use of insulin (HCC) 05/05/2021   Type 2 diabetes mellitus with proteinuria (HCC) 05/05/2021   Chronic bilateral low back pain without sciatica 06/27/2019   Anxiety 07/24/2018   Mild persistent asthma without complication 07/24/2018   Type 2 diabetes mellitus with diabetic polyneuropathy, with long-term current use of insulin (HCC) 07/24/2018   Vitamin D deficiency 07/24/2018   IUD (intrauterine device) in place 06/10/2015   Uterine leiomyoma 06/10/2015   Obstructive sleep apnea of adult 08/20/2014   Morbid obesity (HCC) 11/26/2007   Allergic rhinitis, seasonal 09/04/2007   Migraine without aura and responsive to treatment 05/01/2007   Gastro-esophageal reflux disease without esophagitis 09/04/2006   Hypertension associated with diabetes (HCC) 09/04/2006   Hyperlipidemia associated with type 2 diabetes mellitus (HCC) 09/04/2006    Conditions to be addressed/monitored per PCP order:   community resources  There are no care plans that you recently modified to display for this patient.   Follow up:  Patient agrees to Care Plan and Follow-up.  Plan: The Managed Medicaid care management team will reach out to the patient again over the next 30 days.  Date/time of next scheduled Social Work care management/care coordination outreach:  03/07/23 Gus Puma, Kenard Gower, [facility]  Managed [facility] Social Worker [phone]

## 2023-03-07 ENCOUNTER — Other Ambulatory Visit: Payer: Self-pay

## 2023-03-07 NOTE — Patient Instructions (Signed)
  Medicaid Managed Care   Unsuccessful Outreach Note  03/07/2023 Name: AKANSHA HARROWER MRN: [id number] DOB: [birth year]  Referred by: Marjie Skiff, NP Reason for referral : High Risk Managed Medicaid (MM social work unsuccessful telephone outreach )   An unsuccessful telephone outreach was attempted today. The patient was referred to the case management team for assistance with care management and care coordination.   Follow Up Plan: A HIPAA compliant phone message was left for the patient providing contact information and requesting a return call.   Abelino Derrick, MHA [facility]  Managed [facility] Social Worker [phone]

## 2023-03-07 NOTE — Patient Outreach (Signed)
  Medicaid Managed Care   Unsuccessful Outreach Note  03/07/2023 Name: Michelle Warner MRN: [id number] DOB: [birth year]  Referred by: Marjie Skiff, NP Reason for referral : High Risk Managed Medicaid (MM social work unsuccessful telephone outreach )   An unsuccessful telephone outreach was attempted today. The patient was referred to the case management team for assistance with care management and care coordination.   Follow Up Plan: A HIPAA compliant phone message was left for the patient providing contact information and requesting a return call.   Abelino Derrick, MHA [facility]  Managed [facility] Social Worker [phone]

## 2023-03-12 ENCOUNTER — Other Ambulatory Visit: Payer: Self-pay | Admitting: Nurse Practitioner

## 2023-03-12 NOTE — Telephone Encounter (Unsigned)
Copied from CRM [id number]. Topic: General - Other >> Mar 12, 2023  5:34 PM Everette C wrote: Reason for CRM: Medication Refill - Most Recent Primary Care Visit:  Provider: Aura Dials T Department: [facility] Visit Type: OFFICE VISIT Date: 12/26/2022  Medication: beclomethasone (QVAR REDIHALER) 40 MCG/ACT inhaler [[id number]]  Has the patient contacted their pharmacy? Yes (Agent: If no, request that the patient contact the pharmacy for the refill. If patient does not wish to contact the pharmacy document the reason why and proceed with request.) (Agent: If yes, when and what did the pharmacy advise?)  Is this the correct pharmacy for this prescription? Yes If no, delete pharmacy and type the correct one.  This is the patient's preferred pharmacy:  [facility] [address] ([address]), [place] - [address] [place]) [place] [address] Phone: [phone] Fax: [phone]   Has the prescription been filled recently? Yes  Is the patient out of the medication? Yes  Has the patient been seen for an appointment in the last year OR does the patient have an upcoming appointment? No  Can we respond through MyChart? No  Agent: Please be advised that Rx refills may take up to 3 business days. We ask that you follow-up with your pharmacy.

## 2023-03-13 NOTE — Telephone Encounter (Signed)
Requested Prescriptions  Pending Prescriptions Disp Refills   beclomethasone (QVAR REDIHALER) 40 MCG/ACT inhaler [Pharmacy Med Name: Qvar RediHaler 40 MCG/ACT Inhalation Aerosol Breath Activated] 11 g 0    Sig: INHALE 2 PUFFS TWICE DAILY     Pulmonology:  Corticosteroids Passed - 03/13/2023 11:55 AM      Passed - Valid encounter within last 12 months    Recent Outpatient Visits           2 months ago Type 2 diabetes mellitus with proteinuria (HCC)   [facility] [place], [place] T, NP   5 months ago Type 2 diabetes mellitus with proteinuria (HCC)   [place] [place], [place] T, NP   8 months ago Type 2 diabetes mellitus with proteinuria (HCC)   [place] [facility] [place], Corrie Dandy T, NP   1 year ago Type 2 diabetes mellitus with proteinuria (HCC)   [place] [facility] [place], Corrie Dandy T, NP   1 year ago Type 2 diabetes mellitus with proteinuria (HCC)   [place] [facility] [place], Dorie Rank, NP       Future Appointments             In 2 weeks [name], Dorie Rank, NP [place] [facility]

## 2023-03-22 ENCOUNTER — Other Ambulatory Visit: Payer: Self-pay | Admitting: Nurse Practitioner

## 2023-03-23 NOTE — Patient Instructions (Signed)
 Be Involved in Caring For Your Health:  Taking Medications When medications are taken as directed, they can greatly improve your health. But if they are not taken as prescribed, they may not work. In some cases, not taking them correctly can be harmful. To help ensure your treatment remains effective and safe, understand your medications and how to take them. Bring your medications to each visit for review by your provider.  Your lab results, notes, and after visit summary will be available on My Chart. We strongly encourage you to use this feature. If lab results are abnormal the clinic will contact you with the appropriate steps. If the clinic does not contact you assume the results are satisfactory. You can always view your results on My Chart. If you have questions regarding your health or results, please contact the clinic during office hours. You can also ask questions on My Chart.  We at [facility] are grateful that you chose [place] to provide your care. We strive to provide evidence-based and compassionate care and are always looking for feedback. If you get a survey from the clinic please complete this so we can hear your opinions.  Diabetes Mellitus and Foot Care Diabetes, also called diabetes mellitus, may cause problems with your feet and legs because of poor blood flow (circulation). Poor circulation may make your skin: Become thinner and drier. Break more easily. Heal more slowly. Peel and crack. You may also have nerve damage (neuropathy). This can cause decreased feeling in your legs and feet. This means that you may not notice minor injuries to your feet that could lead to more serious problems. Finding and treating problems early is the best way to prevent future foot problems. How to care for your feet Foot hygiene  Wash your feet daily with warm water and mild soap. Do not use hot water. Then, pat your feet and the areas between your toes until they are fully dry. Do  not soak your feet. This can dry your skin. Trim your toenails straight across. Do not dig under them or around the cuticle. File the edges of your nails with an emery board or nail file. Apply a moisturizing lotion or petroleum jelly to the skin on your feet and to dry, brittle toenails. Use lotion that does not contain alcohol and is unscented. Do not apply lotion between your toes. Shoes and socks Wear clean socks or stockings every day. Make sure they are not too tight. Do not wear knee-high stockings. These may decrease blood flow to your legs. Wear shoes that fit well and have enough cushioning. Always look in your shoes before you put them on to be sure there are no objects inside. To break in new shoes, wear them for just a few hours a day. This prevents injuries on your feet. Wounds, scrapes, corns, and calluses  Check your feet daily for blisters, cuts, bruises, sores, and redness. If you cannot see the bottom of your feet, use a mirror or ask someone for help. Do not cut off corns or calluses or try to remove them with medicine. If you find a minor scrape, cut, or break in the skin on your feet, keep it and the skin around it clean and dry. You may clean these areas with mild soap and water. Do not clean the area with peroxide, alcohol, or iodine. If you have a wound, scrape, corn, or callus on your foot, look at it several times a day to make sure it  is healing and not infected. Check for: Redness, swelling, or pain. Fluid or blood. Warmth. Pus or a bad smell. General tips Do not cross your legs. This may decrease blood flow to your feet. Do not use heating pads or hot water bottles on your feet. They may burn your skin. If you have lost feeling in your feet or legs, you may not know this is happening until it is too late. Protect your feet from hot and cold by wearing shoes, such as at the beach or on hot pavement. Schedule a complete foot exam at least once a year or more often if  you have foot problems. Report any cuts, sores, or bruises to your health care provider right away. Where to find more information American Diabetes Association: diabetes.org Association of Diabetes Care & Education Specialists: diabeteseducator.org Contact a health care provider if: You have a condition that increases your risk of infection, and you have any cuts, sores, or bruises on your feet. You have an injury that is not healing. You have redness on your legs or feet. You feel burning or tingling in your legs or feet. You have pain or cramps in your legs and feet. Your legs or feet are numb. Your feet always feel cold. You have pain around any toenails. Get help right away if: You have a wound, scrape, corn, or callus on your foot and: You have signs of infection. You have a fever. You have a red line going up your leg. This information is not intended to replace advice given to you by your health care provider. Make sure you discuss any questions you have with your health care provider. Document Revised: 09/07/2021 Document Reviewed: 09/07/2021 Elsevier Patient Education  2024 [employer].

## 2023-03-26 NOTE — Telephone Encounter (Signed)
 Requested Prescriptions  Pending Prescriptions Disp Refills   EQ ALL DAY ALLERGY RELIEF 10 MG tablet [Pharmacy Med Name: EQ All Day Allergy Relief 10 MG Oral Tablet] 90 tablet 0    Sig: Take 1 tablet by mouth once daily     Ear, Nose, and Throat:  Antihistamines 2 Passed - 03/26/2023  3:17 PM      Passed - Cr in normal range and within 360 days    Creatinine  Date Value Ref Range Status  08/24/2011 0.71 0.60 - 1.30 mg/dL Final   Creatinine, Ser  Date Value Ref Range Status  01/11/2023 0.88 0.44 - 1.00 mg/dL Final         Passed - Valid encounter within last 12 months    Recent Outpatient Visits           3 months ago Type 2 diabetes mellitus with proteinuria (HCC)   [facility] [place], [place] T, NP   6 months ago Type 2 diabetes mellitus with proteinuria (HCC)   [facility] [place], [place] T, NP   9 months ago Type 2 diabetes mellitus with proteinuria (HCC)   [place] [facility] [place], Melanie T, NP   1 year ago Type 2 diabetes mellitus with proteinuria (HCC)   [place] [facility] [place], Melanie T, NP   1 year ago Type 2 diabetes mellitus with proteinuria (HCC)   [place] [facility] [place], Melanie DASEN, NP       Future Appointments             In 2 days [name], Jolene T, NP [place] [facility]

## 2023-03-28 ENCOUNTER — Encounter: Payer: Self-pay | Admitting: Nurse Practitioner

## 2023-03-28 ENCOUNTER — Ambulatory Visit: Payer: BLUE CROSS/BLUE SHIELD | Admitting: Nurse Practitioner

## 2023-03-28 VITALS — BP 112/71 | HR 80 | Temp 97.5°F | Ht 62.0 in | Wt 206.6 lb

## 2023-03-28 DIAGNOSIS — E1129 Type 2 diabetes mellitus with other diabetic kidney complication: Secondary | ICD-10-CM | POA: Diagnosis not present

## 2023-03-28 DIAGNOSIS — E1169 Type 2 diabetes mellitus with other specified complication: Secondary | ICD-10-CM | POA: Diagnosis not present

## 2023-03-28 DIAGNOSIS — I152 Hypertension secondary to endocrine disorders: Secondary | ICD-10-CM

## 2023-03-28 DIAGNOSIS — R809 Proteinuria, unspecified: Secondary | ICD-10-CM

## 2023-03-28 DIAGNOSIS — Z794 Long term (current) use of insulin: Secondary | ICD-10-CM

## 2023-03-28 DIAGNOSIS — J4530 Mild persistent asthma, uncomplicated: Secondary | ICD-10-CM

## 2023-03-28 DIAGNOSIS — G4733 Obstructive sleep apnea (adult) (pediatric): Secondary | ICD-10-CM

## 2023-03-28 DIAGNOSIS — E1142 Type 2 diabetes mellitus with diabetic polyneuropathy: Secondary | ICD-10-CM

## 2023-03-28 DIAGNOSIS — R011 Cardiac murmur, unspecified: Secondary | ICD-10-CM

## 2023-03-28 DIAGNOSIS — E1159 Type 2 diabetes mellitus with other circulatory complications: Secondary | ICD-10-CM | POA: Diagnosis not present

## 2023-03-28 DIAGNOSIS — F419 Anxiety disorder, unspecified: Secondary | ICD-10-CM

## 2023-03-28 LAB — MICROALBUMIN, URINE WAIVED
Creatinine, Urine Waived: 200 mg/dL (ref 10–300)
Microalb, Ur Waived: 150 mg/L — ABNORMAL HIGH (ref 0–19)

## 2023-03-28 LAB — BAYER DCA HB A1C WAIVED: HB A1C (BAYER DCA - WAIVED): 6.5 % — ABNORMAL HIGH (ref 4.8–5.6)

## 2023-03-28 MED ORDER — TRIAMCINOLONE ACETONIDE 0.1 % EX CREA: 1.0000 | TOPICAL_CREAM | Freq: Two times a day (BID) | CUTANEOUS | 0 refills | Status: DC

## 2023-03-28 MED ORDER — OZEMPIC (2 MG/DOSE) 8 MG/3ML ~~LOC~~ SOPN: 2.0000 mg | PEN_INJECTOR | SUBCUTANEOUS | 4 refills | Status: DC

## 2023-03-28 MED ORDER — ALBUTEROL SULFATE HFA 108 (90 BASE) MCG/ACT IN AERS: 1.0000 | INHALATION_SPRAY | Freq: Four times a day (QID) | RESPIRATORY_TRACT | 8 refills | Status: AC | PRN

## 2023-03-28 MED ORDER — IPRATROPIUM-ALBUTEROL 0.5-2.5 (3) MG/3ML IN SOLN
3.0000 mL | Freq: Once | RESPIRATORY_TRACT | Status: AC
Start: 2023-03-28 — End: 2023-03-28
  Administered 2023-03-28: 3 mL via RESPIRATORY_TRACT

## 2023-03-28 NOTE — Progress Notes (Signed)
 BP 112/71   Pulse 80   Temp (!) 97.5 F (36.4 C) (Oral)   Ht 5' 2 (1.575 m)   Wt 206 lb 9.6 oz (93.7 kg)   SpO2 96%   BMI 37.79 kg/m    Subjective:    Patient ID: Michelle Warner, female    DOB: 11/03/1975, 48 y.o.   MRN: [id number]  HPI: Michelle Warner is a 48 y.o. female  Chief Complaint  Patient presents with   Diabetes    Patient states she has not been able to get Mounjaro  due to insurance, states she started back on her Ozempic  this past weekend. States she has been having trouble getting her medications.    Hyperlipidemia   Hypertension   Depression   DIABETES A1c 7.5% October.  Taking Ozempic  2 MG weekly, Farxiga  10 daily + Glipizide  10 MG BID and Semglee  30 to 34 units at night.  Goal in long run is to reduce insulin  and discontinue if possible. We have attempted to change to Mounjaro , but insurance is no covering at present.  Has been having difficulty getting medications.  Continues Gabapentin  for neuropathy pain + Vitamin B12 supplements.  Lost total of 36 pounds over past year with use of GLP1 and currently maintaining.  Took Metformin  in past but this caused major GI issues. Hypoglycemic episodes:no Polydipsia/polyuria: no Visual disturbance: no Chest pain: no Paresthesias: no Glucose Monitoring: yes  Accucheck frequency: Daily  Fasting glucose: 73 to 143  Post prandial:  Evening:  Before meals: Taking Insulin ?: yes  Long acting insulin : 30 to 34 units Semglee   Short acting insulin : Blood Pressure Monitoring: not checking Retinal Examination: Up to Date -- [facility], injections given -- has not had in awhile Foot Exam: Up to Date Pneumovax: Refuses Influenza: Refuses Aspirin: no   HYPERTENSION / HYPERLIPIDEMIA Continues Lisinopril  20 MG daily and Crestor  10 MG daily.  History of sleep study with recommendation for weight loss, no CPAP. Satisfied with current treatment? yes Duration of hypertension: chronic BP monitoring frequency: not checking BP  range:  BP medication side effects: no Duration of hyperlipidemia: chronic Cholesterol medication side effects: no Cholesterol supplements: none Medication compliance: good compliance Aspirin: no Recent stressors: no Recurrent headaches: no Visual changes: no Palpitations: no Dyspnea: no Chest pain: no Lower extremity edema: no Dizzy/lightheaded: no   ASTHMA Continues Qvar  2 puffs BID + Albuterol  PRN.  Is living with brother-in-law who does smoke at times. Asthma status: stable Satisfied with current treatment?: yes Albuterol /rescue inhaler frequency: once a month or less Dyspnea frequency: no Wheezing frequency: no Cough frequency: occasional Nocturnal symptom frequency: no Limitation of activity: no Current upper respiratory symptoms: no Triggers: pollen Home peak flows: none Aerochamber/spacer use: no Visits to ER or Urgent Care in past year: no Pneumovax:  refused Influenza:  refused    DEPRESSION Continues on Celexa  and Trazodone .  Has not started Buspar , has it in case needs it.  Had visits with [facility] in past, but PCP took over this.  Some increased anxiety due to living with mother-in-law and money stressors. Mood status: exacerbated due to stressors Satisfied with current treatment?: yes Symptom severity: moderate  Duration of current treatment : chronic Side effects: no Medication compliance: good compliance Psychotherapy/counseling: none Depressed mood: yes Anxious mood: yes Anhedonia: no Significant weight loss or gain: no Insomnia: yes hard to fall asleep Fatigue: yes Feelings of worthlessness or guilt: no Impaired concentration/indecisiveness: no Suicidal ideations: no Hopelessness: no Crying spells: yes  03/28/2023    3:15 PM 12/26/2022    3:50 PM 09/25/2022    2:28 PM 06/26/2022    1:10 PM 02/20/2022    2:31 PM  Depression screen PHQ 2/9  Decreased Interest 1 1 0 0 0  Down, Depressed, Hopeless 2 1 1 1  0  PHQ - 2 Score 3 2 1 1  0  Altered sleeping  1 2 1 1 1   Tired, decreased energy 2 2 1 2 3   Change in appetite 0 0 0 0 0  Feeling bad or failure about yourself  0 0 0 0 0  Trouble concentrating 0 0 0 1 0  Moving slowly or fidgety/restless 0 0 0 0 0  Suicidal thoughts 0 0 0 0 0  PHQ-9 Score 6 6 3 5 4   Difficult doing work/chores Not difficult at all Not difficult at all Not difficult at all Not difficult at all Not difficult at all       03/28/2023    3:15 PM 12/26/2022    3:50 PM 09/25/2022    2:28 PM 06/26/2022    1:11 PM  GAD 7 : Generalized Anxiety Score  Nervous, Anxious, on Edge 2 1 1 2   Control/stop worrying 3 1 1 1   Worry too much - different things 3 3 1 1   Trouble relaxing 2 2 0 1  Restless 2 1 0 1  Easily annoyed or irritable 3 1 1 2   Afraid - awful might happen 0 0 1 0  Total GAD 7 Score 15 9 5 8   Anxiety Difficulty Not difficult at all Not difficult at all Not difficult at all Not difficult at all   Relevant past medical, surgical, family and social history reviewed and updated as indicated. Interim medical history since our last visit reviewed. Allergies and medications reviewed and updated.  Review of Systems  Constitutional:  Negative for activity change, appetite change, diaphoresis, fatigue and fever.  Respiratory:  Negative for cough, chest tightness, shortness of breath and wheezing.   Cardiovascular:  Negative for chest pain, palpitations and leg swelling.  Gastrointestinal: Negative.   Endocrine: Negative for cold intolerance, heat intolerance, polydipsia, polyphagia and polyuria.  Neurological: Negative.   Psychiatric/Behavioral:  Negative for decreased concentration, self-injury, sleep disturbance and suicidal ideas. The patient is nervous/anxious.    Per HPI unless specifically indicated above     Objective:    BP 112/71   Pulse 80   Temp (!) 97.5 F (36.4 C) (Oral)   Ht 5' 2 (1.575 m)   Wt 206 lb 9.6 oz (93.7 kg)   SpO2 96%   BMI 37.79 kg/m   Wt Readings from Last 3 Encounters:  03/28/23  206 lb 9.6 oz (93.7 kg)  01/11/23 206 lb 2.1 oz (93.5 kg)  12/26/22 206 lb 3.2 oz (93.5 kg)    Physical Exam Vitals and nursing note reviewed.  Constitutional:      General: She is awake. She is not in acute distress.    Appearance: She is well-developed and well-groomed. She is obese. She is not ill-appearing or toxic-appearing.  HENT:     Head: Normocephalic.     Right Ear: Hearing normal.     Left Ear: Hearing normal.  Eyes:     General: Lids are normal.        Right eye: No discharge.        Left eye: No discharge.     Pupils: Pupils are equal, round, and reactive to light.  Funduscopic exam:    Right eye: No hemorrhage.        Left eye: No hemorrhage.  Neck:     Thyroid: No thyromegaly.     Vascular: No carotid bruit.  Cardiovascular:     Rate and Rhythm: Normal rate and regular rhythm.     Heart sounds: Murmur heard.     Systolic murmur is present with a grade of 3/6.     No gallop.  Pulmonary:     Effort: Pulmonary effort is normal. No accessory muscle usage or respiratory distress.     Breath sounds: Normal breath sounds.  Abdominal:     General: Bowel sounds are normal.     Palpations: Abdomen is soft.  Musculoskeletal:     Cervical back: Normal range of motion and neck supple.     Right lower leg: No edema.     Left lower leg: No edema.  Lymphadenopathy:     Head:     Right side of head: No submental, submandibular, tonsillar, preauricular or posterior auricular adenopathy.     Left side of head: No submental, submandibular, tonsillar, preauricular or posterior auricular adenopathy.     Cervical: No cervical adenopathy.  Skin:    General: Skin is warm and dry.  Neurological:     Mental Status: She is alert and oriented to person, place, and time.  Psychiatric:        Attention and Perception: Attention normal.        Mood and Affect: Mood normal. Affect is tearful.        Speech: Speech normal.        Behavior: Behavior normal. Behavior is cooperative.         Thought Content: Thought content normal.    Results for orders placed or performed in visit on 03/28/23  Bayer DCA Hb A1c Waived   Collection Time: 03/28/23  3:15 PM  Result Value Ref Range   HB A1C (BAYER DCA - WAIVED) 6.5 (H) 4.8 - 5.6 %  Microalbumin, Urine Waived   Collection Time: 03/28/23  3:15 PM  Result Value Ref Range   Microalb, Ur Waived 150 (H) 0 - 19 mg/L   Creatinine, Urine Waived 200 10 - 300 mg/dL   Microalb/Creat Ratio 30-300 (H) <30 mg/g      Assessment & Plan:   Problem List Items Addressed This Visit       Cardiovascular and Mediastinum   Hypertension associated with diabetes (HCC)   Chronic, stable.  BP well below goal in office.  Currently on Lisinopril  for kidney protection and BP, may benefit from switch to ARB in future due to her underlying asthma.  Recommend she monitor BP at least a few mornings a week at home and document.  DASH diet at home.  Labs today: A1c and CMP.  Urine ALB 150 January 2025.  Return in 3 months.       Relevant Medications   Semaglutide , 2 MG/DOSE, (OZEMPIC , 2 MG/DOSE,) 8 MG/3ML SOPN   Other Relevant Orders   Bayer DCA Hb A1c Waived (Completed)   Microalbumin, Urine Waived (Completed)   Comprehensive metabolic panel     Respiratory   Mild persistent asthma without complication   Chronic, ongoing.  Continue current inhaler regimen as ordered and adjust as needed.  Plan on spirometry at future visit.  Continue modest weight loss.  Labs today: up to date.  Ordered her a new nebulizer machine today and provided Duoneb due to trigger recently with smoker.  Relevant Medications   albuterol  (VENTOLIN  HFA) 108 (90 Base) MCG/ACT inhaler   Other Relevant Orders   For home use only DME Nebulizer machine   Obstructive sleep apnea of adult   Reports she was told she did not need CPAP, but to focus on weight loss -- may benefit repeat study in future.        Endocrine   Type 2 diabetes mellitus with diabetic  polyneuropathy, with long-term current use of insulin  (HCC)   Chronic, ongoing with A1c 6.5% today, trend down. Urine ALB 150 January 2025.  Continue Gabapentin  and B12 for neuropathy.   - Continue Ozempic  2 MG weekly (however if A1c still >7%, will attempt to switch to Mounjaro , placed PharmD order to assist) and continue Farxiga  daily with goal in future to reduce to discontinue insulin , discussed with patient.  At this time continue Glipizide  10 MG BID and Semglee  30 units, discussed with her that if lows present she is to reduce this by 3 units every 3 days if needed = if BS in morning <130 consistently.   - Check BS TID and document for visits.   - Up to date on eye and foot exam - On ACE and Statin - Refuses PCV20 and Flu - Return in 3 months.      Relevant Medications   Semaglutide , 2 MG/DOSE, (OZEMPIC , 2 MG/DOSE,) 8 MG/3ML SOPN   Other Relevant Orders   Bayer DCA Hb A1c Waived (Completed)   Microalbumin, Urine Waived (Completed)   AMB Referral [facility] Care Management   Hyperlipidemia associated with type 2 diabetes mellitus (HCC)   Chronic, ongoing.  Continue current medication regimen and adjust as needed.  Lipid panel today.      Relevant Medications   Semaglutide , 2 MG/DOSE, (OZEMPIC , 2 MG/DOSE,) 8 MG/3ML SOPN   Other Relevant Orders   Bayer DCA Hb A1c Waived (Completed)   Comprehensive metabolic panel   Lipid Panel w/o Chol/HDL Ratio   Type 2 diabetes mellitus with proteinuria (HCC) - Primary   Chronic, ongoing with A1c 6.5% today, trend down. Urine ALB 150 January 2025, continue Lisinopril .  Continue Gabapentin  and B12 for neuropathy.   - Continue Ozempic  2 MG weekly (however if A1c still >7%, will attempt to switch to Mounjaro , placed PharmD order to assist) and continue Farxiga  daily with goal in future to reduce to discontinue insulin , discussed with patient.  At this time continue Glipizide  10 MG BID and Semglee  30 units, discussed with her that if lows present she is to  reduce this by 3 units every 3 days if needed = if BS in morning <130 consistently.   - Check BS TID and document for visits.   - Up to date on eye and foot exam - On ACE and Statin - Refuses PCV20 and Flu - Return in 3 months.      Relevant Medications   Semaglutide , 2 MG/DOSE, (OZEMPIC , 2 MG/DOSE,) 8 MG/3ML SOPN   Other Relevant Orders   Bayer DCA Hb A1c Waived (Completed)   Microalbumin, Urine Waived (Completed)   AMB Referral [facility] Care Management     Other   Anxiety   Chronic, stable.  Was followed by [facility] in past, but PCP currently fills prescriptions.  Will return to them if any worsening mood.  Denies SI/HI.  Continue current medication regimen which offers benefit to her, including Buspar  5 MG BID due to current increased stressors -- she will take if needed.      Heart murmur, systolic  Ongoing with history of work-up in past.  Had ordered repeat echo to check on this but she missed visit, will place new order next visit.  She denies any symptoms.      Morbid obesity (HCC)   BMI 37.79 with T2DM, HLD, HTN.  Recommended eating smaller high protein, low fat meals more frequently and exercising 30 mins a day 5 times a week with a goal of 10-15lb weight loss in the next 3 months. Patient voiced their understanding and motivation to adhere to these recommendations.       Relevant Medications   Semaglutide , 2 MG/DOSE, (OZEMPIC , 2 MG/DOSE,) 8 MG/3ML SOPN     Follow up plan: Return in about 3 months (around 06/26/2023) for T2DM, HTN/HLD, MOOD, ASTHMA.

## 2023-03-28 NOTE — Assessment & Plan Note (Signed)
 Chronic, stable.  Was followed by [facility] in past, but PCP currently fills prescriptions.  Will return to them if any worsening mood.  Denies SI/HI.  Continue current medication regimen which offers benefit to her, including Buspar  5 MG BID due to current increased stressors -- she will take if needed.

## 2023-03-28 NOTE — Assessment & Plan Note (Signed)
 Chronic, stable.  BP well below goal in office.  Currently on Lisinopril  for kidney protection and BP, may benefit from switch to ARB in future due to her underlying asthma.  Recommend she monitor BP at least a few mornings a week at home and document.  DASH diet at home.  Labs today: A1c and CMP.  Urine ALB 150 January 2025.  Return in 3 months.

## 2023-03-28 NOTE — Assessment & Plan Note (Signed)
 Chronic, ongoing with A1c 6.5% today, trend down. Urine ALB 150 January 2025.  Continue Gabapentin  and B12 for neuropathy.   - Continue Ozempic  2 MG weekly (however if A1c still >7%, will attempt to switch to Mounjaro , placed PharmD order to assist) and continue Farxiga  daily with goal in future to reduce to discontinue insulin , discussed with patient.  At this time continue Glipizide  10 MG BID and Semglee  30 units, discussed with her that if lows present she is to reduce this by 3 units every 3 days if needed = if BS in morning <130 consistently.   - Check BS TID and document for visits.   - Up to date on eye and foot exam - On ACE and Statin - Refuses PCV20 and Flu - Return in 3 months.

## 2023-03-28 NOTE — Assessment & Plan Note (Signed)
Reports she was told she did not need CPAP, but to focus on weight loss -- may benefit repeat study in future. °

## 2023-03-28 NOTE — Assessment & Plan Note (Signed)
 Ongoing with history of work-up in past.  Had ordered repeat echo to check on this but she missed visit, will place new order next visit.  She denies any symptoms.

## 2023-03-28 NOTE — Assessment & Plan Note (Signed)
 Chronic, ongoing with A1c 6.5% today, trend down. Urine ALB 150 January 2025, continue Lisinopril .  Continue Gabapentin  and B12 for neuropathy.   - Continue Ozempic  2 MG weekly (however if A1c still >7%, will attempt to switch to Mounjaro , placed PharmD order to assist) and continue Farxiga  daily with goal in future to reduce to discontinue insulin , discussed with patient.  At this time continue Glipizide  10 MG BID and Semglee  30 units, discussed with her that if lows present she is to reduce this by 3 units every 3 days if needed = if BS in morning <130 consistently.   - Check BS TID and document for visits.   - Up to date on eye and foot exam - On ACE and Statin - Refuses PCV20 and Flu - Return in 3 months.

## 2023-03-28 NOTE — Assessment & Plan Note (Signed)
 Chronic, ongoing.  Continue current inhaler regimen as ordered and adjust as needed.  Plan on spirometry at future visit.  Continue modest weight loss.  Labs today: up to date.  Ordered her a new nebulizer machine today and provided Duoneb due to trigger recently with smoker.

## 2023-03-28 NOTE — Assessment & Plan Note (Signed)
 Chronic, ongoing.  Continue current medication regimen and adjust as needed. Lipid panel today.

## 2023-03-28 NOTE — Assessment & Plan Note (Signed)
 BMI 37.79 with T2DM, HLD, HTN.  Recommended eating smaller high protein, low fat meals more frequently and exercising 30 mins a day 5 times a week with a goal of 10-15lb weight loss in the next 3 months. Patient voiced their understanding and motivation to adhere to these recommendations.

## 2023-03-29 LAB — LIPID PANEL W/O CHOL/HDL RATIO
Cholesterol, Total: 127 mg/dL (ref 100–199)
HDL: 43 mg/dL (ref >39)
LDL Chol Calc (NIH): 60 mg/dL (ref 0–99)
Triglycerides: 138 mg/dL (ref 0–149)
VLDL Cholesterol Cal: 24 mg/dL (ref 5–40)

## 2023-03-29 LAB — COMPREHENSIVE METABOLIC PANEL
ALT: 16 [IU]/L (ref 0–32)
AST: 15 [IU]/L (ref 0–40)
Albumin: 4.3 g/dL (ref 3.9–4.9)
Alkaline Phosphatase: 62 [IU]/L (ref 44–121)
BUN/Creatinine Ratio: 14 (ref 9–23)
BUN: 10 mg/dL (ref 6–24)
Bilirubin Total: 0.3 mg/dL (ref 0.0–1.2)
CO2: 23 mmol/L (ref 20–29)
Calcium: 9.3 mg/dL (ref 8.7–10.2)
Chloride: 103 mmol/L (ref 96–106)
Creatinine, Ser: 0.7 mg/dL (ref 0.57–1.00)
Globulin, Total: 2.5 g/dL (ref 1.5–4.5)
Glucose: 147 mg/dL — ABNORMAL HIGH (ref 70–99)
Potassium: 4.6 mmol/L (ref 3.5–5.2)
Sodium: 140 mmol/L (ref 134–144)
Total Protein: 6.8 g/dL (ref 6.0–8.5)
eGFR: 109 mL/min/{1.73_m2} (ref >59)

## 2023-03-29 NOTE — Progress Notes (Signed)
 Contacted via MyChart   Good morning Michelle Warner, your labs have returned and overall are stable.  No changes needed.  Great news!!  Any questions? Keep being stellar!!  Thank you for allowing me to participate in your care.  I appreciate you. Kindest regards, Braian Tijerina

## 2023-03-30 ENCOUNTER — Telehealth: Payer: Self-pay

## 2023-03-30 NOTE — Progress Notes (Signed)
 Care Guide Pharmacy Note  03/30/2023 Name: [name] MRN: [id number] DOB: 1975-03-27  Referred By: Valerio Melanie DASEN, NP Reason for referral: Care Coordination (Outreach to schedule with Pharm d )   Michelle Warner is a 48 y.o. year old female who is a primary care patient of [name], Jolene T, NP.  Rock MARLA Franks was referred to the pharmacist for assistance related to: DMII  Successful contact was made with the patient to discuss pharmacy services including being ready for the pharmacist to call at least 5 minutes before the scheduled appointment time and to have medication bottles and any blood pressure readings ready for review. The patient agreed to meet with the pharmacist via telephone visit on (date/time).04/03/2023  Jeoffrey Buffalo , RMA     [place]  [facility] Guide  Direct Dial: [phone]  Website: [email or web address]

## 2023-04-02 ENCOUNTER — Telehealth: Payer: Self-pay

## 2023-04-02 NOTE — Telephone Encounter (Signed)
 PA for Ozempic initiated and submitted via Cover My Meds. Key: [id number]

## 2023-04-03 ENCOUNTER — Other Ambulatory Visit: Payer: Self-pay

## 2023-04-03 NOTE — Progress Notes (Signed)
 04/03/2023 Name: Michelle Warner MRN: [id number] DOB: 07/16/1975  Chief Complaint  Patient presents with   Medication Assistance   Michelle Warner is a 48 y.o. year old female who presented for a telephone visit.   They were referred to the pharmacist by their PCP for assistance in managing medication access.    Subjective:  Care Team: Primary Care Provider: Valerio Melanie DASEN, NP ; Next Scheduled Visit: 07/10/23  Medication Access/Adherence  Current Pharmacy:  [facility] [address] ([address]), [place] - [address] [place] ([place]) [place] [address] Phone: [phone] Fax: [phone]  -Patient reports affordability concerns with their medications: Yes  -Patient reports access/transportation concerns to their pharmacy: No  -Patient reports adherence concerns with their medications:  No    Diabetes: Current medications: Farxiga  10 mg daily, glipizide  10 mg twice daily before meals, Semglee  34 units at bedtime, Ozempic  2 mg once weekly -Medications tried in the past: Mounjaro  but insurance will no longer cover -Patient does check home blood glucose and endorses fasting blood glucose numbers between 90 and 120 -A1c was 6.5% on 1/8- this was down from 7.3 in July -Patient is currently taking Ozempic  2 mg once weekly since insurance is no longer covering Mounjaro , but she does not have the same weight loss effect with this medication.  She and provider would prefer for her to be able to get back on Mounjaro . -Patient is also inquiring about additional medications that could be added to her regimen to assist with weight loss -Patient goal is to be able to stop insulin  use in the long run  Objective: Lab Results  Component Value Date   HGBA1C 6.5 (H) 03/28/2023   Lab Results  Component Value Date   CREATININE 0.70 03/28/2023   BUN 10 03/28/2023   NA 140 03/28/2023   K 4.6 03/28/2023   CL 103 03/28/2023   CO2 23 03/28/2023   Medications  Reviewed Today     Reviewed by Deanna Channing LABOR, RPH (Pharmacist) on 04/03/23 at 1436  Med List Status: <None>   Medication Order Taking? Sig Documenting Provider Last Dose Status Informant  acetaminophen  (TYLENOL ) 500 MG tablet [id number]  Take 1,000 mg by mouth at bedtime. For back, leg and knee pain [provider]  Active Self  albuterol  (VENTOLIN  HFA) 108 (90 Base) MCG/ACT inhaler [id number] Yes Inhale 1-2 puffs into the lungs every 6 (six) hours as needed for wheezing or shortness of breath. [name], Jolene T, NP Taking Active   beclomethasone (QVAR  REDIHALER) 40 MCG/ACT inhaler [id number] Yes INHALE 2 PUFFS TWICE DAILY [name], Jolene T, NP Taking Active   Blood Glucose Monitoring Suppl (FIFTY50 GLUCOSE METER 2.0) w/Device KIT [id number] Yes Disp. blood glucose meter kit preferred by patient's insurance. Check blood sugars as directed by provider. Dx: Diabetes, E11.9 Valerio Melanie DASEN, NP Taking Active   busPIRone  (BUSPAR ) 5 MG tablet [id number] Yes Take 1 tablet (5 mg total) by mouth 2 (two) times daily. [name], Jolene T, NP Taking Active   citalopram  (CELEXA ) 20 MG tablet [id number] Yes Take 1 tablet by mouth daily. (Failure to attend appt on 07/13/20 will result in titrating off the medication) [name], Jolene T, NP Taking Active   cyanocobalamin (VITAMIN B12) 1000 MCG tablet [id number] Yes Take 1 tablet (1,000 mcg total) by mouth daily. [name], Jolene T, NP Taking Active   dapagliflozin  propanediol (FARXIGA ) 10 MG TABS tablet [id number] Yes Take 1 tablet (10 mg total) by mouth daily before breakfast. Valerio,  Melanie DASEN, NP Taking Active   EQ ALL DAY ALLERGY RELIEF 10 MG tablet [id number] Yes Take 1 tablet by mouth once daily [name], Jolene T, NP Taking Active   fluticasone  (FLONASE ) 50 MCG/ACT nasal spray [id number]  Place 2 sprays into both nostrils daily. Chaplin, Don C, MD  Active   gabapentin  (NEURONTIN ) 300 MG capsule [id number]  Take 1 capsule (300 mg total) by mouth 3 (three) times  daily. Valerio Melanie T, NP  Active   gentamicin  ointment (GARAMYCIN ) 0.1 % [id number]  Apply 1 Application topically 3 (three) times daily. To wound on back of left heel. [name], Jolene T, NP  Active   glipiZIDE  (GLUCOTROL ) 10 MG tablet [id number] Yes TAKE ONE TABLET BY MOUTH 2 TIMES A DAY BEFORE A MEAL [name], Jolene T, NP Taking Active   glucose blood ([employer] BLOOD GLUCOSE TEST) test strip [id number] Yes Disp test strips preferred by insurance plan. Testing twice daily. Dx:E11.9,Z79.4  Taking Active   insulin  glargine-yfgn (SEMGLEE , YFGN,) 100 UNIT/ML Pen [id number] Yes Inject 30 Units into the skin at bedtime.  Patient taking differently: Inject 34 Units into the skin at bedtime.   Valerio Melanie DASEN, NP Taking Active   Insulin  Pen Needle (BD PEN NEEDLE NANO 2ND GEN) 32G X 4 MM MISC [id number] Yes USE 1 PEN NEEDLE ONCE DAILY [name], Jolene T, NP Taking Active   lidocaine  (LIDODERM ) 5 % [id number]  Place 1 patch onto the skin daily. Remove & Discard patch within 12 hours or as directed by MD [name], Jolene T, NP  Active   lisinopril  (ZESTRIL ) 20 MG tablet [id number] Yes Take 1 tablet (20 mg total) by mouth daily. Valerio Melanie T, NP Taking Active   omeprazole  (PRILOSEC) 20 MG capsule [id number] Yes Take 1 capsule (20 mg total) by mouth 2 (two) times daily. [name], Jolene T, NP Taking Active   rosuvastatin  (CRESTOR ) 20 MG tablet [id number] Yes Take 1 tablet (20 mg total) by mouth daily. Valerio Melanie T, NP Taking Active   Semaglutide , 2 MG/DOSE, (OZEMPIC , 2 MG/DOSE,) 8 MG/3ML SOPN [id number] No Inject 2 mg into the skin once a week.  Patient not taking: Reported on 04/03/2023   [name], Jolene T, NP Not Taking Active   tirzepatide  (MOUNJARO ) 10 MG/0.5ML Pen [id number] No Inject 10 mg into the skin once a week. Start after have completed 5 MG dosing.  Patient not taking: Reported on 03/28/2023   [name], Jolene T, NP Not Taking Active   traZODone  (DESYREL ) 100 MG tablet [id number] Yes Take 1.5 tablets  (150 mg total) by mouth at bedtime. Valerio Melanie T, NP Taking Active   triamcinolone  cream (KENALOG ) 0.1 % [id number]  Apply 1 Application topically 2 (two) times daily. [name], Jolene T, NP  Active   Vitamin D , Ergocalciferol , (DRISDOL ) 1.25 MG (50000 UNIT) CAPS capsule [id number] Yes Take 1 capsule (50,000 Units total) by mouth every 7 (seven) days. Valerio Melanie DASEN, NP Taking Active            Assessment/Plan:   Diabetes: -Currently controlled -Patient has [employer] as well as [employer]  insurance.  I will pursue prior authorization for Mounjaro  on both plans to see if we can get either to cover. -If we can get insurance approval for Mounjaro , it is likely that we can titrate dose over time up to 15 mg once weekly and be able to stop insulin  use.  Increasing Mounjaro  dose as well as stopping insulin  will benefit in regard to weight loss.  Hopefully  no additional medication will be needed.  Follow Up Plan: Will monitor progress of prior authorization and once approved we will check on co-pay with pharmacy and notify patient and provider.  Then will schedule follow-up appropriately.  [name], PharmD, DPLA

## 2023-04-04 NOTE — Telephone Encounter (Signed)
 PA has been approved

## 2023-04-09 NOTE — Progress Notes (Signed)
   04/09/2023  Patient ID: Michelle Warner, female   DOB: April 05, 1975, 48 y.o.   MRN: [id number]  Prior authorization for Mounjaro 10 mg has been approved by patient's [employer] insurance per CMM.  Prior authorization was already not required for patient's primary insurance.  Contacted [facility] to have them process a refill, and the prescription is now going through for a $4 co-pay.  It appears the patient just filled a refill of Ozempic on the 15th, but I recommend switching to Mounjaro 10 mg once Ozempic on hand is completed.  Attempted to contact [name] to inform and schedule a follow-up visit, but I was not able to reach her; so a HIPAA compliant voicemail with my direct phone number was left.  I will try to contact the patient again next week if I have not heard back.  Lenna Gilford, PharmD, DPLA

## 2023-04-18 ENCOUNTER — Telehealth: Payer: Self-pay

## 2023-04-18 NOTE — Progress Notes (Signed)
   04/18/2023  Patient ID: Michelle Warner, female   DOB: 06-21-1975, 48 y.o.   MRN: [id number]  Patient out reach to inform [name] that insurance approved the prior authorization for her Mounjaro 10 mg weekly.  Patient plans to stop Ozempic 2 mg and go back to Mounjaro 10 mg weekly, as this will now be a $4 co-pay.  Instructed the patient to reach out if there were any issues getting this refilled.  Follow-up visit scheduled to check in in 4 weeks.  Lenna Gilford, PharmD, DPLA

## 2023-05-16 ENCOUNTER — Other Ambulatory Visit: Payer: Self-pay

## 2023-05-16 ENCOUNTER — Other Ambulatory Visit: Payer: Self-pay | Admitting: Nurse Practitioner

## 2023-05-16 NOTE — Progress Notes (Unsigned)
   05/16/2023  Patient ID: Michelle Warner, female   DOB: 07-17-1975, 48 y.o.   MRN: [id number]  Subjective/objective Telephone visit to follow-up on management of diabetes  Diabetes management plan -Current medications:  Ozempic 2mg  weekly, Farxiga 10 mg daily, glipizide 10 mg 2 times daily before a meal, Semglee 34 units at bedtime -Patient still had doses of Ozempic 2 mg at home that she did not want to waste, so she has not yet started Mounjaro 10 mg weekly -Patient is due for GLP-1 dose Saturday and will be out of Ozempic 2 mg at that time -Patient does monitor fasting blood glucose daily and endorses reading this morning of 90 -She does not endorse any signs or symptoms of hypoglycemia but does state readings have been elevated after meals  Medication refill -Patient stating she is down to her last few days of Qvar inhaler and there are no refills remaining  Assessment/plan  Diabetes management plan -Patient will begin Mounjaro 10 mg weekly Saturday -Continue Farxiga 10 mg daily, glipizide 10 mg twice daily, and Semglee 34 units -Continue to monitor fasting and postprandial blood glucose regularly -Goal to decrease and potentially stop Semglee as we titrate Mounjaro dose  Medication refill -Pending refill order for Qvar for PCP to sign if in agreement  Follow-up: 4 weeks  Lenna Gilford, PharmD, DPLA

## 2023-05-17 NOTE — Telephone Encounter (Signed)
 Requested Prescriptions  Pending Prescriptions Disp Refills   beclomethasone (QVAR REDIHALER) 40 MCG/ACT inhaler [Pharmacy Med Name: Qvar RediHaler 40 MCG/ACT Inhalation Aerosol Breath Activated] 11 g 0    Sig: INHALE 2 PUFFS TWICE DAILY     Pulmonology:  Corticosteroids Passed - 05/17/2023  9:44 AM      Passed - Valid encounter within last 12 months    Recent Outpatient Visits           1 month ago Type 2 diabetes mellitus with proteinuria (HCC)   [facility] [place], Wacissa T, NP   4 months ago Type 2 diabetes mellitus with proteinuria (HCC)   [place] [facility] [place], [place] T, NP   7 months ago Type 2 diabetes mellitus with proteinuria (HCC)   [place] [facility] [place], [place] T, NP   10 months ago Type 2 diabetes mellitus with proteinuria (HCC)   [facility] [place], Corrie Dandy T, NP   1 year ago Type 2 diabetes mellitus with proteinuria (HCC)   [place] [facility] [place], Dorie Rank, NP       Future Appointments             In 1 month [name], Dorie Rank, NP [place] [facility]

## 2023-05-30 ENCOUNTER — Other Ambulatory Visit: Payer: Self-pay

## 2023-05-30 ENCOUNTER — Emergency Department
Admission: EM | Admit: 2023-05-30 | Discharge: 2023-05-30 | Disposition: A | Discharge status: Home / Self Care | Attending: Emergency Medicine | Admitting: Emergency Medicine

## 2023-05-30 ENCOUNTER — Encounter: Payer: Self-pay | Admitting: Emergency Medicine

## 2023-05-30 DIAGNOSIS — K047 Periapical abscess without sinus: Secondary | ICD-10-CM | POA: Diagnosis not present

## 2023-05-30 DIAGNOSIS — K0889 Other specified disorders of teeth and supporting structures: Secondary | ICD-10-CM | POA: Diagnosis present

## 2023-05-30 MED ORDER — AMOXICILLIN-POT CLAVULANATE 875-125 MG PO TABS: 1.0000 | ORAL_TABLET | Freq: Two times a day (BID) | ORAL | 0 refills | Status: AC

## 2023-05-30 NOTE — ED Provider Triage Note (Signed)
 Emergency Medicine Provider Triage Evaluation Note  Michelle Warner , a 48 y.o. female  was evaluated in triage.  Pt complains of right lower dental pain since yesterday.  Patient denies fever, neck pain ear pain.  Review of Systems  Positive: Negative:   Physical Exam  BP (!) 138/92   Pulse 87   Temp 98.5 F (36.9 C) (Oral)   Resp 18   Ht 5\' 2"  (1.575 m)   Wt 99.3 kg   SpO2 97%   BMI 40.06 kg/m  Gen:   Awake, no distress   Resp:  Normal effort  MSK:   Moves extremities without difficulty  Other:  Right face is not tender to palpation  Medical Decision Making  Medically screening exam initiated at 4:16 PM.  Appropriate orders placed.  Michelle Warner was informed that the remainder of the evaluation will be completed by another provider, this initial triage assessment does not replace that evaluation, and the importance of remaining in the ED until their evaluation is complete. Patient with dental pain   Gladys Damme, PA-C 05/30/23 1616

## 2023-05-30 NOTE — ED Notes (Signed)
 ED Provider at bedside.

## 2023-05-30 NOTE — ED Triage Notes (Signed)
 Patient to ED via POV for dental pain- right upper. Started yesterday.

## 2023-05-30 NOTE — Discharge Instructions (Addendum)
 Take Tylenol 1 g every 8 hours, ibuprofen 600 every 6 hours with food to help with pain for one week.  Take the antibiotics.  You need to go to a dental clinic to have your teeth further evaluated. Please call them and make appointment for next week.  These infection could worsen and cause worsening issues, abscess, infection.  OPTIONS FOR DENTAL FOLLOW UP CARE  [place] [facility] and [facility] - Local Safety Net Dental Clinics [email or web address]   [facility] [phone])  [place] [phone])  [place] [phone] ext [phone])  [facility] [phone])  [facility] [phone]) This clinic caters to the indigent population and is on a lottery system. Location: [employer] [facility], [employer], [address] [place] Clinic Hours: Wednesdays from 6pm - 9pm, patients seen by a lottery system. For dates, call or go to [email or web address] Services: Cleanings, fillings and simple extractions. Payment Options: DENTAL WORK IS FREE OF CHARGE. Bring proof of income or support. Best way to get seen: Arrive at 5:15 pm - this is a lottery, NOT first come/first serve, so arriving earlier will not increase your chances of being seen.     [facility] [phone] Select option 1 for emergencies   Location: [facility], [place], [address] [place] Clinic Hours: No walk-ins accepted - call the day before to schedule an appointment. Check in times are 9:30 am and 1:30 pm. Services: Simple extractions, temporary fillings, pulpectomy/pulp debridement, uncomplicated abscess drainage. Payment Options: PAYMENT IS DUE AT THE TIME OF SERVICE.  Fee is usually $100-200, additional surgical procedures (e.g. abscess drainage) may be extra. Cash, checks, Visa/MasterCard accepted.  Can  file Medicaid if patient is covered for dental - patient should call case worker to check. No discount for [facility] patients. Best way to get seen: MUST call the day before and get onto the schedule. Can usually be seen the next 1-2 days. No walk-ins accepted.     [facility] [phone]   Location: [facility], [address] [place] Clinic Hours: M, W, Th, F 8am or 1:30pm, Tues 9a or 1:30 - first come/first served. Services: Simple extractions, temporary fillings, uncomplicated abscess drainage.  You do not need to be an [facility] resident. Payment Options: PAYMENT IS DUE AT THE TIME OF SERVICE. Dental insurance, otherwise sliding scale - bring proof of income or support. Depending on income and treatment needed, cost is usually $50-200. Best way to get seen: Arrive early as it is first come/first served.     [facility] [phone]   Location: [address] Clinic Hours: Mon-Thu 8a-5p Services: Most basic dental services including extractions and fillings. Payment Options: PAYMENT IS DUE AT THE TIME OF SERVICE. Sliding scale, up to 50% off - bring proof if income or support. Medicaid with dental option accepted. Best way to get seen: Call to schedule an appointment, can usually be seen within 2 weeks OR they will try to see walk-ins - show up at 8a or 2p (you may have to wait).     [facility] [phone] [place] RESIDENTS ONLY   Location: [facility], [address], [place], [place] [address] Clinic Hours: By appointment only. Monday - Thursday 8am-5pm, Friday 8am-12pm Services: Cleanings, fillings, extractions. Payment Options: PAYMENT IS DUE AT THE TIME OF SERVICE. Cash, Visa or MasterCard. Sliding scale - $  30 minimum per service. Best way to get seen: Come in to office, complete packet and make an appointment - need proof  of income or support monies for each household member and proof of [facility] residence. Usually takes about a month to get in.     [facility] [phone]   Location: [address], [facility] Clinic Hours: Walk-in Urgent Care Dental Services are offered Monday-Friday mornings only. The numbers of emergencies accepted daily is limited to the number of providers available. Maximum 15 - Mondays, Wednesdays & Thursdays Maximum 10 - Tuesdays & Fridays Services: You do not need to be a [facility] resident to be seen for a dental emergency. Emergencies are defined as pain, swelling, abnormal bleeding, or dental trauma. Walkins will receive x-rays if needed. NOTE: Dental cleaning is not an emergency. Payment Options: PAYMENT IS DUE AT THE TIME OF SERVICE. Minimum co-pay is $40.00 for uninsured patients. Minimum co-pay is $3.00 for Medicaid with dental coverage. Dental Insurance is accepted and must be presented at time of visit. Medicare does not cover dental. Forms of payment: Cash, credit card, checks. Best way to get seen: If not previously registered with the clinic, walk-in dental registration begins at 7:15 am and is on a first come/first serve basis. If previously registered with the clinic, call to make an appointment.     [facility] [phone] [place] RESIDENTS ONLY   Location: [address] [place], [place] Clinic Hours: Mon-Thu 10a-2p Services: Extractions only! Payment Options: FREE (donations accepted) - bring proof of income or support Best way to get seen: Call and schedule an appointment OR come at 8am on the 1st Monday of every month (except for holidays) when it is first come/first served.     [facility] [phone]   Location: [address] [place], [place] Clinic Hours: Friday mornings Services, Payment Options, Best way to get seen: Call for info

## 2023-05-30 NOTE — ED Provider Notes (Signed)
 [facility] Provider Note    Event Date/Time   First MD Initiated Contact with Patient 05/30/23 1818     (approximate)   History   Dental Pain   HPI  Michelle Warner is a 48 y.o. female who comes in with right lower dental pain since yesterday.  Patient reports some pain not in the lower but more the upper area.  She denies any known fevers.  She has had dental infections previously and states that she just feels like there is an infection and she needs antibiotics.  She never followed up with the dental clinic.  I reviewed the note where patient was seen 01/20/2019 for for dental pain patient was prescribed antibiotic.   Physical Exam   Triage Vital Signs: ED Triage Vitals [05/30/23 1615]  Encounter Vitals Group     BP (!) 138/92     Systolic BP Percentile      Diastolic BP Percentile      Pulse Rate 87     Resp 18     Temp 98.5 F (36.9 C)     Temp Source Oral     SpO2 97 %     Weight 219 lb (99.3 kg)     Height 5\' 2"  (1.575 m)     Head Circumference      Peak Flow      Pain Score 8     Pain Loc      Pain Education      Exclude from Growth Chart     Most recent vital signs: Vitals:   05/30/23 1615  BP: (!) 138/92  Pulse: 87  Resp: 18  Temp: 98.5 F (36.9 C)  SpO2: 97%     General: Awake, no distress.  CV:  Good peripheral perfusion.  Resp:  Normal effort.  Abd:  No distention.  Other:  Patient has poor dentition.  She is missing multiple teeth.  In the back upper molar area there is a missing tooth with some dead looking skin covering it.  No significant tenderness over this area no obvious abscess.  She is actually more tender along one of the teeth that are still present.  No abscess noted here.  Full range of motion of neck.  No tenderness under the tongue.   ED Results / Procedures / Treatments   Labs (all labs ordered are listed, but only abnormal results are displayed) Labs Reviewed - No data to display   EKG  My  interpretation of EKG:    RADIOLOGY I have reviewed the xray personally and interpreted    PROCEDURES:  Critical Care performed: No  Procedures   MEDICATIONS ORDERED IN ED: Medications - No data to display   IMPRESSION / MDM / ASSESSMENT AND PLAN / ED COURSE  I reviewed the triage vital signs and the nursing notes.   Patient's presentation is most consistent with acute, uncomplicated illness.  Discussed with patient need for follow-up with a dental clinic given concern for dental infection.  I do not see any obvious abscess.  No evidence of Ludwig's or deeper abscess.  She has a missing tooth on the back right upper molar that I told her was discolored skin I suspect due to no good blood flow given missing tooth. Maybe pulp necrosis. that I explained that she needed to see dental doctor to have this area evaluated.  At this time I do not feel like she needs to see them emergently given she is well-appearing  but I did express the importance of seeing them as soon as she could and we will start her on some antibiotics.  She expressed understanding felt comfortable with this plan  Creatinine check was 0.7 on 1/8   FINAL CLINICAL IMPRESSION(S) / ED DIAGNOSES   Final diagnoses:  Dental infection     Rx / DC Orders   ED Discharge Orders          Ordered    amoxicillin-clavulanate (AUGMENTIN) 875-125 MG tablet  2 times daily        05/30/23 1831             Note:  This document was prepared using Dragon voice recognition software and may include unintentional dictation errors.   Concha Se, MD 05/30/23 [phone]

## 2023-06-13 ENCOUNTER — Other Ambulatory Visit: Payer: Self-pay

## 2023-06-13 MED ORDER — QVAR REDIHALER 40 MCG/ACT IN AERB: 2.0000 | INHALATION_SPRAY | Freq: Two times a day (BID) | RESPIRATORY_TRACT | 2 refills | Status: DC

## 2023-06-13 MED ORDER — LORATADINE 10 MG PO TABS: 10.0000 mg | ORAL_TABLET | Freq: Every day | ORAL | 1 refills | Status: AC

## 2023-06-13 NOTE — Progress Notes (Unsigned)
   06/13/2023  Patient ID: [name], female   DOB: 1975/11/17, 48 y.o.   MRN: [id number]  Subjective/Objective Telephone visit to follow-up on management of diabetes  Diabetes Management Plan -Current medications:  Mounjaro 10mg  weekly, Semglee 30 units daily, glipizide 10mg  BID, Farxiga 10mg  daily -Patient has tried metformin several times in the past and cannot tolerate -Switched from [employer] 2mg  to [employer] 10mg  two weeks ago and is tolerating well -Currently suffering from URI and taking prednisone, which is increasing BG numbers- FBG today 241, and usual FBG readings 88-96  Assessment/Plan  Diabetes Management Plan -Increase Semglee back to 34 units while taking prednisone -Check BG more frequently on sick days (at least 2-3x/daily) -Discuss increasing Mounjaro and further decreasing Semglee after 1 month at [facility] 10mg  weekly -Sees PCP again 4/22 and will be due for A1c  Follow-up:  2 weeks  Lenna Gilford, PharmD, DPLA

## 2023-06-14 ENCOUNTER — Other Ambulatory Visit: Payer: Self-pay | Admitting: Nurse Practitioner

## 2023-06-14 NOTE — Telephone Encounter (Unsigned)
 Copied from CRM [id number]. Topic: Clinical - Medication Refill >> Jun 14, 2023  5:52 PM Shelah Lewandowsky wrote: Most Recent Primary Care Visit:  Provider: Aura Dials T  Department: [facility]  Visit Type: OFFICE VISIT  Date: 03/28/2023  Medication: insulin glargine-yfgn (SEMGLEE, YFGN,) 100 UNIT/ML Pen  Has the patient contacted their pharmacy? Yes (Agent: If no, request that the patient contact the pharmacy for the refill. If patient does not wish to contact the pharmacy document the reason why and proceed with request.) (Agent: If yes, when and what did the pharmacy advise?)  Is this the correct pharmacy for this prescription? Yes If no, delete pharmacy and type the correct one.  This is the patient's preferred pharmacy:  [facility] [address]), [place] - [address] [place]) [place] [address] Phone: [phone] Fax: [phone]   Has the prescription been filled recently? Yes  Is the patient out of the medication? Yes  Has the patient been seen for an appointment in the last year OR does the patient have an upcoming appointment? Yes  Can we respond through MyChart? Yes  Agent: Please be advised that Rx refills may take up to 3 business days. We ask that you follow-up with your pharmacy.

## 2023-06-15 ENCOUNTER — Ambulatory Visit: Payer: Self-pay

## 2023-06-15 ENCOUNTER — Other Ambulatory Visit: Payer: Self-pay | Admitting: Nurse Practitioner

## 2023-06-15 NOTE — Telephone Encounter (Signed)
 Copied from CRM [id number]. Topic: Clinical - Prescription Issue >> Jun 14, 2023  5:54 PM Victorino Dike T wrote: Reason for CRM: insulin glargine-yfgn (SEMGLEE, YFGN,) 100 UNIT/ML Pen- pharmacy states no refills on this prescription but it shows there are [phone]- [phone]- patient is out of this and has questions about medications she needs to take that will raise her blood sugar

## 2023-06-15 NOTE — Telephone Encounter (Signed)
 Patient called to check status of this request. Let her know if has been sent to provider. State she has been without it for two days. Glucose 150. Thank You

## 2023-06-15 NOTE — Telephone Encounter (Signed)
 Patient called in regard to her Insulin Glargine not being refilled yet. Per notes, patient requested refill yesterday. Patient has been out for two days. Patient currently on Prednisone and states her glucose is currently 205, when normally it is in the high 90's. Patient is not symptomatic at this time. Patient advised to contact [facility] to check on refill availability. Advised patient to seek evaluation at [facility] if patient becomes symptomatic before med can be refilled. Informed patient a high priority message will be sent to clinic as well.   Copied from CRM [id number]. Topic: Clinical - Medication Question >> Jun 15, 2023  5:30 PM Ja-Kwan M wrote: Reason for CRM: Patient called for an update on the Rx refill request for insulin glargine-yfgn (SEMGLEE, YFGN,) 100 UNIT/ML Pen. Patient stated that she has not had the medication for 2 days and requests call back to discuss. Call back# [phone] Reason for Disposition  RN needs further essential information from caller in order to complete triage    Will call on call provider  Answer Assessment - Initial Assessment Questions 1. REASON FOR CALL or QUESTION: "What is your reason for calling today?" or "How can I best help you?" or "What question do you have that I can help answer?"     Please see notes - Patient checking on information for refill  Protocols used: Information Only Call - No Triage-A-AH

## 2023-06-15 NOTE — Telephone Encounter (Signed)
 Requested medication (s) are due for refill today - yes  Requested medication (s) are on the active medication list -yes  Future visit scheduled -yes  Last refill: 10/18/22 15ml 4RF  Notes to clinic: off protocol- provider review - Rx states patient using 34 unit/hs  Requested Prescriptions  Pending Prescriptions Disp Refills   insulin glargine-yfgn (SEMGLEE, YFGN,) 100 UNIT/ML Pen 15 mL 4    Sig: Inject 30 Units into the skin at bedtime.     Off-Protocol Failed - 06/15/2023 12:04 PM      Failed - Medication not assigned to a protocol, review manually.      Failed - Valid encounter within last 12 months    Recent Outpatient Visits   None     Future Appointments             In 3 weeks [name], Dorie Rank, NP [facility]               Requested Prescriptions  Pending Prescriptions Disp Refills   insulin glargine-yfgn (SEMGLEE, YFGN,) 100 UNIT/ML Pen 15 mL 4    Sig: Inject 30 Units into the skin at bedtime.     Off-Protocol Failed - 06/15/2023 12:04 PM      Failed - Medication not assigned to a protocol, review manually.      Failed - Valid encounter within last 12 months    Recent Outpatient Visits   None     Future Appointments             In 3 weeks [name], Dorie Rank, NP [facility]

## 2023-06-15 NOTE — Telephone Encounter (Signed)
 Requested medications are due for refill today.  yes  Requested medications are on the active medications list.  yes  Last refill. 10/18/2022  15 mL 4 rf  Future visit scheduled.   yes  Notes to clinic.  Medication not assigned to a protocol. Please review for refill.    Requested Prescriptions  Pending Prescriptions Disp Refills   SEMGLEE, YFGN, 100 UNIT/ML Pen [Pharmacy Med Name: Semglee (yfgn) 100 UNIT/ML Subcutaneous Solution Pen-injector] 15 mL 0    Sig: INJECT 30 UNITS SUBCUTANEOUSLY  AT BEDTIME     Off-Protocol Failed - 06/15/2023  5:12 PM      Failed - Medication not assigned to a protocol, review manually.      Failed - Valid encounter within last 12 months    Recent Outpatient Visits   None     Future Appointments             In 3 weeks [name], Dorie Rank, NP [facility], [facility]

## 2023-06-18 NOTE — Telephone Encounter (Signed)
 Spoke with patient and confirmed that she was able to pick up her prescription at the pharmacy. This morning her sugars have returned to normal around 80. She is aware she also has refills for [facility] and pharmacy shouldn't have any issues filling but if they do to please call us to let us know.

## 2023-06-25 ENCOUNTER — Other Ambulatory Visit: Payer: Self-pay | Admitting: Nurse Practitioner

## 2023-06-25 NOTE — Telephone Encounter (Unsigned)
 Copied from CRM [id number]. Topic: Clinical - Medication Refill >> Jun 25, 2023  8:33 AM Higinio Roger wrote: Most Recent Primary Care Visit:  Provider: Aura Dials T  Department: [facility]  Visit Type: OFFICE VISIT  Date: 03/28/2023  Medication: loratadine (EQ ALL DAY ALLERGY RELIEF) 10 MG tablet   Has the patient contacted their pharmacy? Yes (Agent: If no, request that the patient contact the pharmacy for the refill. If patient does not wish to contact the pharmacy document the reason why and proceed with request.) (Agent: If yes, when and what did the pharmacy advise?)  Is this the correct pharmacy for this prescription? Yes If no, delete pharmacy and type the correct one.  This is the patient's preferred pharmacy:   [facility] [address]), [place] - [address] [place]) [place] [address] Phone: [phone] Fax: [phone]  Has the prescription been filled recently? Yes  Is the patient out of the medication? Yes  Has the patient been seen for an appointment in the last year OR does the patient have an upcoming appointment? Yes  Can we respond through MyChart? Yes  Agent: Please be advised that Rx refills may take up to 3 business days. We ask that you follow-up with your pharmacy.

## 2023-06-26 NOTE — Telephone Encounter (Signed)
 Pharmacy need to be called to confirm receipt of prescription. E-Prescribing Status: Receipt confirmed by pharmacy (06/13/2023  5:06 PM EDT)

## 2023-06-26 NOTE — Telephone Encounter (Signed)
 [facility] [phone] Pharmacy called and spoke to Michelle Warner, [employer] about the refill(s) loratadine requested. Advised it was sent on 06/13/23 #90/1 refill(s). She states rx was received, filled and pt has not picked up rx yet. Attempted to call pt but received recording "call cannot be completed as dialed."  loratadine (EQ ALL DAY ALLERGY RELIEF) 10 MG tablet90 tablet13/26/2025--Sig - Route: Take 1 tablet (10 mg total) by mouth daily. - OralSent to pharmacy as: loratadine (EQ ALL DAY ALLERGY RELIEF) 10 MG tabletE-Prescribing Status: Receipt confirmed by pharmacy (06/13/2023  5:06 PM EDT)

## 2023-06-27 ENCOUNTER — Other Ambulatory Visit: Payer: Self-pay

## 2023-06-27 NOTE — Progress Notes (Unsigned)
   06/27/2023  Patient ID: [name], female   DOB: 1975-05-14, 48 y.o.   MRN: [id number]  Outreach attempt for scheduled telephone follow-up unsuccessful, and call would not go through to allow voicemail to be left.  Sending MyChart message to attempt to reschedule visit and will call patient again if I do not hear back in 1-2 weeks.  Lenna Gilford, PharmD, DPLA

## 2023-06-30 ENCOUNTER — Other Ambulatory Visit: Payer: Self-pay | Admitting: Nurse Practitioner

## 2023-07-02 NOTE — Telephone Encounter (Signed)
 Requested medications are due for refill today.  yes  Requested medications are on the active medications list.  yes  Last refill. 06/26/2022 #12 4 rf  Future visit scheduled.   yes  Notes to clinic.  Refill at this dosage to reviewed by pcp.    Requested Prescriptions  Pending Prescriptions Disp Refills   Vitamin D, Ergocalciferol, (DRISDOL) 1.25 MG (50000 UNIT) CAPS capsule [Pharmacy Med Name: Vitamin D (Ergocalciferol) 1.25 MG (50000 UT) Oral Capsule] 12 capsule 0    Sig: Take 1 capsule by mouth once a week     Endocrinology:  Vitamins - Vitamin D Supplementation 2 Failed - 07/02/2023  2:34 PM      Failed - Manual Review: Route requests for 50,000 IU strength to the provider      Failed - Vitamin D in normal range and within 360 days    Vit D, 25-Hydroxy  Date Value Ref Range Status  06/26/2022 60.7 30.0 - 100.0 ng/mL Final    Comment:    Vitamin D deficiency has been defined by the Institute of Medicine and an Endocrine Society practice guideline as a level of serum 25-OH vitamin D less than 20 ng/mL (1,2). The Endocrine Society went on to further define vitamin D insufficiency as a level between 21 and 29 ng/mL (2). 1. IOM (Institute of Medicine). 2010. Dietary reference    intakes for calcium and D. [place]: The    [employer]. 2. Holick MF, Binkley [place], Bischoff-Ferrari HA, et al.    Evaluation, treatment, and prevention of vitamin D    deficiency: an Endocrine Society clinical practice    guideline. JCEM. 2011 Jul; 96(7):1911-30.          Failed - Valid encounter within last 12 months    Recent Outpatient Visits   None     Future Appointments             In 1 week [name], Dorie Rank, NP [place] [facility], [facility]            Passed - Ca in normal range and within 360 days    Calcium  Date Value Ref Range Status  03/28/2023 9.3 8.7 - 10.2 mg/dL Final   Calcium, Total  Date Value Ref Range Status  08/24/2011 8.9 8.5 -  10.1 mg/dL Final         Signed Prescriptions Disp Refills   citalopram (CELEXA) 20 MG tablet 90 tablet 0    Sig: Take 1 tablet by mouth once daily     Psychiatry:  Antidepressants - SSRI Failed - 07/02/2023  2:34 PM      Failed - Valid encounter within last 6 months    Recent Outpatient Visits   None     Future Appointments             In 1 week [name], Dorie Rank, NP [place] [facility], [facility]             traZODone (DESYREL) 100 MG tablet 135 tablet 0    Sig: TAKE 1 & 1/2 (ONE & ONE-HALF) TABLETS BY MOUTH AT BEDTIME     Psychiatry: Antidepressants - Serotonin Modulator Failed - 07/02/2023  2:34 PM      Failed - Valid encounter within last 6 months    Recent Outpatient Visits   None     Future Appointments             In 1 week Marjie Skiff, NP  [facility], [facility]

## 2023-07-02 NOTE — Telephone Encounter (Signed)
 Requested Prescriptions  Pending Prescriptions Disp Refills   citalopram (CELEXA) 20 MG tablet [Pharmacy Med Name: Citalopram Hydrobromide 20 MG Oral Tablet] 90 tablet 0    Sig: Take 1 tablet by mouth once daily     Psychiatry:  Antidepressants - SSRI Failed - 07/02/2023  2:34 PM      Failed - Valid encounter within last 6 months    Recent Outpatient Visits   None     Future Appointments             In 1 week [name], Dorie Rank, NP [place] [facility], [facility]             traZODone (DESYREL) 100 MG tablet [Pharmacy Med Name: traZODone HCl 100 MG Oral Tablet] 135 tablet 0    Sig: TAKE 1 & 1/2 (ONE & ONE-HALF) TABLETS BY MOUTH AT BEDTIME     Psychiatry: Antidepressants - Serotonin Modulator Failed - 07/02/2023  2:34 PM      Failed - Valid encounter within last 6 months    Recent Outpatient Visits   None     Future Appointments             In 1 week [name], Dorie Rank, NP [place] [facility], [facility]             Vitamin D, Ergocalciferol, (DRISDOL) 1.25 MG (50000 UNIT) CAPS capsule [Pharmacy Med Name: Vitamin D (Ergocalciferol) 1.25 MG (50000 UT) Oral Capsule] 12 capsule 0    Sig: Take 1 capsule by mouth once a week     Endocrinology:  Vitamins - Vitamin D Supplementation 2 Failed - 07/02/2023  2:34 PM      Failed - Manual Review: Route requests for 50,000 IU strength to the provider      Failed - Vitamin D in normal range and within 360 days    Vit D, 25-Hydroxy  Date Value Ref Range Status  06/26/2022 60.7 30.0 - 100.0 ng/mL Final    Comment:    Vitamin D deficiency has been defined by the Institute of Medicine and an Endocrine Society practice guideline as a level of serum 25-OH vitamin D less than 20 ng/mL (1,2). The Endocrine Society went on to further define vitamin D insufficiency as a level between 21 and 29 ng/mL (2). 1. IOM (Institute of Medicine). 2010. Dietary reference    intakes for calcium and D. [place]: The     [employer]. 2. Holick MF, Binkley [place], Bischoff-Ferrari HA, et al.    Evaluation, treatment, and prevention of vitamin D    deficiency: an Endocrine Society clinical practice    guideline. JCEM. 2011 Jul; 96(7):1911-30.          Failed - Valid encounter within last 12 months    Recent Outpatient Visits   None     Future Appointments             In 1 week [name], Dorie Rank, NP [place] [facility], [facility]            Passed - Ca in normal range and within 360 days    Calcium  Date Value Ref Range Status  03/28/2023 9.3 8.7 - 10.2 mg/dL Final   Calcium, Total  Date Value Ref Range Status  08/24/2011 8.9 8.5 - 10.1 mg/dL Final

## 2023-07-09 ENCOUNTER — Other Ambulatory Visit: Payer: Self-pay | Admitting: Nurse Practitioner

## 2023-07-09 MED ORDER — CITALOPRAM HYDROBROMIDE 20 MG PO TABS: 20.0000 mg | ORAL_TABLET | Freq: Every day | ORAL | 2 refills | Status: DC

## 2023-07-09 MED ORDER — VITAMIN D (ERGOCALCIFEROL) 1.25 MG (50000 UNIT) PO CAPS: 50000.0000 [IU] | ORAL_CAPSULE | ORAL | 3 refills | Status: AC

## 2023-07-09 MED ORDER — TRIAMCINOLONE ACETONIDE 0.1 % EX CREA
1.0000 | TOPICAL_CREAM | Freq: Two times a day (BID) | CUTANEOUS | 0 refills | Status: AC
Start: 2023-07-09 — End: ?

## 2023-07-09 MED ORDER — TRAZODONE HCL 100 MG PO TABS: 150.0000 mg | ORAL_TABLET | Freq: Every evening | ORAL | 2 refills | Status: DC | PRN

## 2023-07-10 ENCOUNTER — Ambulatory Visit: Payer: MEDICAID | Admitting: Nurse Practitioner

## 2023-07-10 DIAGNOSIS — E559 Vitamin D deficiency, unspecified: Secondary | ICD-10-CM

## 2023-07-10 DIAGNOSIS — J4530 Mild persistent asthma, uncomplicated: Secondary | ICD-10-CM

## 2023-07-10 DIAGNOSIS — I152 Hypertension secondary to endocrine disorders: Secondary | ICD-10-CM

## 2023-07-10 DIAGNOSIS — R011 Cardiac murmur, unspecified: Secondary | ICD-10-CM

## 2023-07-10 DIAGNOSIS — F419 Anxiety disorder, unspecified: Secondary | ICD-10-CM

## 2023-07-10 DIAGNOSIS — E538 Deficiency of other specified B group vitamins: Secondary | ICD-10-CM

## 2023-07-10 DIAGNOSIS — E1142 Type 2 diabetes mellitus with diabetic polyneuropathy: Secondary | ICD-10-CM

## 2023-07-10 DIAGNOSIS — R809 Proteinuria, unspecified: Secondary | ICD-10-CM

## 2023-07-10 DIAGNOSIS — G4733 Obstructive sleep apnea (adult) (pediatric): Secondary | ICD-10-CM

## 2023-07-10 DIAGNOSIS — E1169 Type 2 diabetes mellitus with other specified complication: Secondary | ICD-10-CM

## 2023-07-10 DIAGNOSIS — K219 Gastro-esophageal reflux disease without esophagitis: Secondary | ICD-10-CM

## 2023-07-14 DIAGNOSIS — Z7985 Long-term (current) use of injectable non-insulin antidiabetic drugs: Secondary | ICD-10-CM | POA: Insufficient documentation

## 2023-07-14 NOTE — Patient Instructions (Signed)
 Be Involved in Caring For Your Health:  Taking Medications When medications are taken as directed, they can greatly improve your health. But if they are not taken as prescribed, they may not work. In some cases, not taking them correctly can be harmful. To help ensure your treatment remains effective and safe, understand your medications and how to take them. Bring your medications to each visit for review by your provider.  Your lab results, notes, and after visit summary will be available on My Chart. We strongly encourage you to use this feature. If lab results are abnormal the clinic will contact you with the appropriate steps. If the clinic does not contact you assume the results are satisfactory. You can always view your results on My Chart. If you have questions regarding your health or results, please contact the clinic during office hours. You can also ask questions on My Chart.  We at [facility] are grateful that you chose [place] to provide your care. We strive to provide evidence-based and compassionate care and are always looking for feedback. If you get a survey from the clinic please complete this so we can hear your opinions.  Diabetes Mellitus and Foot Care Diabetes, also called diabetes mellitus, may cause problems with your feet and legs because of poor blood flow (circulation). Poor circulation may make your skin: Become thinner and drier. Break more easily. Heal more slowly. Peel and crack. You may also have nerve damage (neuropathy). This can cause decreased feeling in your legs and feet. This means that you may not notice minor injuries to your feet that could lead to more serious problems. Finding and treating problems early is the best way to prevent future foot problems. How to care for your feet Foot hygiene  Wash your feet daily with warm water and mild soap. Do not use hot water. Then, pat your feet and the areas between your toes until they are fully dry. Do  not soak your feet. This can dry your skin. Trim your toenails straight across. Do not dig under them or around the cuticle. File the edges of your nails with an emery board or nail file. Apply a moisturizing lotion or petroleum jelly to the skin on your feet and to dry, brittle toenails. Use lotion that does not contain alcohol and is unscented. Do not apply lotion between your toes. Shoes and socks Wear clean socks or stockings every day. Make sure they are not too tight. Do not wear knee-high stockings. These may decrease blood flow to your legs. Wear shoes that fit well and have enough cushioning. Always look in your shoes before you put them on to be sure there are no objects inside. To break in new shoes, wear them for just a few hours a day. This prevents injuries on your feet. Wounds, scrapes, corns, and calluses  Check your feet daily for blisters, cuts, bruises, sores, and redness. If you cannot see the bottom of your feet, use a mirror or ask someone for help. Do not cut off corns or calluses or try to remove them with medicine. If you find a minor scrape, cut, or break in the skin on your feet, keep it and the skin around it clean and dry. You may clean these areas with mild soap and water. Do not clean the area with peroxide, alcohol, or iodine. If you have a wound, scrape, corn, or callus on your foot, look at it several times a day to make sure it  is healing and not infected. Check for: Redness, swelling, or pain. Fluid or blood. Warmth. Pus or a bad smell. General tips Do not cross your legs. This may decrease blood flow to your feet. Do not use heating pads or hot water bottles on your feet. They may burn your skin. If you have lost feeling in your feet or legs, you may not know this is happening until it is too late. Protect your feet from hot and cold by wearing shoes, such as at the beach or on hot pavement. Schedule a complete foot exam at least once a year or more often if  you have foot problems. Report any cuts, sores, or bruises to your health care provider right away. Where to find more information American Diabetes Association: diabetes.org Association of Diabetes Care & Education Specialists: diabeteseducator.org Contact a health care provider if: You have a condition that increases your risk of infection, and you have any cuts, sores, or bruises on your feet. You have an injury that is not healing. You have redness on your legs or feet. You feel burning or tingling in your legs or feet. You have pain or cramps in your legs and feet. Your legs or feet are numb. Your feet always feel cold. You have pain around any toenails. Get help right away if: You have a wound, scrape, corn, or callus on your foot and: You have signs of infection. You have a fever. You have a red line going up your leg. This information is not intended to replace advice given to you by your health care provider. Make sure you discuss any questions you have with your health care provider. Document Revised: 09/07/2021 Document Reviewed: 09/07/2021 Elsevier Patient Education  2024 [employer].

## 2023-07-16 ENCOUNTER — Encounter: Payer: Self-pay | Admitting: Nurse Practitioner

## 2023-07-16 ENCOUNTER — Ambulatory Visit (INDEPENDENT_AMBULATORY_CARE_PROVIDER_SITE_OTHER): Payer: MEDICAID | Admitting: Nurse Practitioner

## 2023-07-16 VITALS — BP 98/62 | HR 93 | Temp 98.7°F | Ht 62.0 in | Wt 211.6 lb

## 2023-07-16 DIAGNOSIS — G63 Polyneuropathy in diseases classified elsewhere: Secondary | ICD-10-CM

## 2023-07-16 DIAGNOSIS — E1169 Type 2 diabetes mellitus with other specified complication: Secondary | ICD-10-CM | POA: Diagnosis not present

## 2023-07-16 DIAGNOSIS — E1159 Type 2 diabetes mellitus with other circulatory complications: Secondary | ICD-10-CM | POA: Diagnosis not present

## 2023-07-16 DIAGNOSIS — K219 Gastro-esophageal reflux disease without esophagitis: Secondary | ICD-10-CM

## 2023-07-16 DIAGNOSIS — E1129 Type 2 diabetes mellitus with other diabetic kidney complication: Secondary | ICD-10-CM | POA: Diagnosis not present

## 2023-07-16 DIAGNOSIS — E119 Type 2 diabetes mellitus without complications: Secondary | ICD-10-CM

## 2023-07-16 DIAGNOSIS — E559 Vitamin D deficiency, unspecified: Secondary | ICD-10-CM

## 2023-07-16 DIAGNOSIS — E1142 Type 2 diabetes mellitus with diabetic polyneuropathy: Secondary | ICD-10-CM

## 2023-07-16 DIAGNOSIS — E538 Deficiency of other specified B group vitamins: Secondary | ICD-10-CM

## 2023-07-16 DIAGNOSIS — R011 Cardiac murmur, unspecified: Secondary | ICD-10-CM

## 2023-07-16 DIAGNOSIS — Z7985 Long-term (current) use of injectable non-insulin antidiabetic drugs: Secondary | ICD-10-CM

## 2023-07-16 DIAGNOSIS — I152 Hypertension secondary to endocrine disorders: Secondary | ICD-10-CM

## 2023-07-16 DIAGNOSIS — J4530 Mild persistent asthma, uncomplicated: Secondary | ICD-10-CM

## 2023-07-16 DIAGNOSIS — Z794 Long term (current) use of insulin: Secondary | ICD-10-CM

## 2023-07-16 DIAGNOSIS — F419 Anxiety disorder, unspecified: Secondary | ICD-10-CM

## 2023-07-16 LAB — MICROALBUMIN, URINE WAIVED
Creatinine, Urine Waived: 200 mg/dL (ref 10–300)
Microalb, Ur Waived: 150 mg/L — ABNORMAL HIGH (ref 0–19)

## 2023-07-16 LAB — BAYER DCA HB A1C WAIVED: HB A1C (BAYER DCA - WAIVED): 7.5 % — ABNORMAL HIGH (ref 4.8–5.6)

## 2023-07-16 MED ORDER — BENZONATATE 100 MG PO CAPS: 100.0000 mg | ORAL_CAPSULE | Freq: Two times a day (BID) | ORAL | 1 refills | Status: AC | PRN

## 2023-07-16 MED ORDER — TIRZEPATIDE 12.5 MG/0.5ML ~~LOC~~ SOAJ: 12.5000 mg | SUBCUTANEOUS | 1 refills | Status: DC

## 2023-07-16 MED ORDER — FREESTYLE LIBRE 3 SENSOR MISC: 4 refills | Status: DC

## 2023-07-16 MED ORDER — FREESTYLE LIBRE 3 READER DEVI: 4 refills | Status: DC

## 2023-07-16 NOTE — Progress Notes (Signed)
 BP 98/62   Pulse 93   Temp 98.7 F (37.1 C) (Oral)   Ht 5\' 2"  (1.575 m)   Wt 211 lb 9.6 oz (96 kg)   SpO2 96%   BMI 38.70 kg/m    Subjective:    Patient ID: Michelle Warner, female    DOB: June 09, 1975, 48 y.o.   MRN: [id number]  HPI: Michelle Warner is a 48 y.o. female  Chief Complaint  Patient presents with   Diabetes   Hypertension   Hyperlipidemia   Gastroesophageal Reflux   Anxiety   NOTE WRITTEN BY DNP STUDENT.  ASSESSMENT AND PLAN OF CARE REVIEWED WITH STUDENT, AGREE WITH ABOVE FINDINGS AND PLAN.  DIABETES January A1c 6.5%.  Continues on Mounjaro  10 weekly, Farxiga  10 daily, Glipizide  10 MG BID and Semglee  30 to 34 units at night.  Goal in is to discontinue insulin  in future. Takes Gabapentin  for neuropathy pain + Vitamin B12 supplement + Vitamin D .  Lost total of 36 pounds over past year with use of GLP1 and currently maintaining.   Took Metformin  in past but this caused major GI issues. Hypoglycemic episodes:no Polydipsia/polyuria: no Visual disturbance: yes Chest pain: no Paresthesias: no Glucose Monitoring: yes  Accucheck frequency: Daily  Fasting glucose: 85  Before meals: 110 Taking Insulin ?: yes  Long acting insulin :Semglee  30 units at HS Blood Pressure Monitoring: not checking Retinal Examination: Not up to Date Foot Exam: Not up to Date Diabetic Education: Completed Pneumovax:  refuses Influenza:  refuses Aspirin: no   HYPERTENSION / HYPERLIPIDEMIA Taking Lisinopril  20 MG daily and Crestor  10 MG daily.   Satisfied with current treatment? yes Duration of hypertension: chronic BP monitoring frequency: not checking BP medication side effects: no Duration of hyperlipidemia: chronic Cholesterol medication side effects: no Cholesterol supplements: none Medication compliance: good compliance Aspirin: no Recent stressors: no Recurrent headaches: no Visual changes: no Palpitations: no Dyspnea: no Chest pain: no Lower extremity edema:  no Dizzy/lightheaded: no   GERD Takes Omeprazole  daily. GERD control status: controlled Satisfied with current treatment? yes Medication side effects: no  Medication compliance: stable Dysphagia: no Odynophagia:  no Hematemesis: no Blood in stool: no EGD: no   DEPRESSION & ANXIETY Taking Celexa , Trazodone , and Buspar .  Saw [facility] in past, but PCP took over treatment years ago.  Mood status: controlled Satisfied with current treatment?: yes Symptom severity: mild  Duration of current treatment : years Side effects: no Medication compliance: good compliance Psychotherapy/counseling: no    Depressed mood: no Anxious mood: no Anhedonia: no Significant weight loss or gain: no Insomnia: no hard to fall asleep Fatigue: yes Feelings of worthlessness or guilt: no Impaired concentration/indecisiveness: no Suicidal ideations: no Hopelessness: no Crying spells: yes    07/16/2023    3:32 PM 03/28/2023    3:15 PM 12/26/2022    3:50 PM 09/25/2022    2:28 PM 06/26/2022    1:10 PM  Depression screen PHQ 2/9  Decreased Interest 0 1 1 0 0  Down, Depressed, Hopeless 1 2 1 1 1   PHQ - 2 Score 1 3 2 1 1   Altered sleeping 2 1 2 1 1   Tired, decreased energy 2 2 2 1 2   Change in appetite 2 0 0 0 0  Feeling bad or failure about yourself  0 0 0 0 0  Trouble concentrating 0 0 0 0 1  Moving slowly or fidgety/restless 0 0 0 0 0  Suicidal thoughts 0 0 0 0 0  PHQ-9 Score 7 6  6 3 5   Difficult doing work/chores Not difficult at all Not difficult at all Not difficult at all Not difficult at all Not difficult at all       07/16/2023    3:33 PM 03/28/2023    3:15 PM 12/26/2022    3:50 PM 09/25/2022    2:28 PM  GAD 7 : Generalized Anxiety Score  Nervous, Anxious, on Edge 1 2 1 1   Control/stop worrying 2 3 1 1   Worry too much - different things 1 3 3 1   Trouble relaxing 0 2 2 0  Restless 0 2 1 0  Easily annoyed or irritable 2 3 1 1   Afraid - awful might happen 0 0 0 1  Total GAD 7 Score 6 15 9 5    Anxiety Difficulty Not difficult at all Not difficult at all Not difficult at all Not difficult at all      Relevant past medical, surgical, family and social history reviewed and updated as indicated. Interim medical history since our last visit reviewed. Allergies and medications reviewed and updated.  Review of Systems  Constitutional:  Negative for fever.  HENT:  Negative for congestion and postnasal drip.   Eyes: Negative.   Respiratory:  Positive for cough. Negative for shortness of breath.   Cardiovascular:  Negative for chest pain and leg swelling.  Gastrointestinal: Negative.   Endocrine: Negative for polydipsia and polyuria.  Genitourinary: Negative.   Musculoskeletal: Negative.   Skin: Negative.   Allergic/Immunologic: Negative.   Neurological:  Negative for light-headedness and headaches.  Hematological: Negative.   Psychiatric/Behavioral:  The patient is not nervous/anxious.     Per HPI unless specifically indicated above     Objective:    BP 98/62   Pulse 93   Temp 98.7 F (37.1 C) (Oral)   Ht 5\' 2"  (1.575 m)   Wt 211 lb 9.6 oz (96 kg)   SpO2 96%   BMI 38.70 kg/m   Wt Readings from Last 3 Encounters:  07/16/23 211 lb 9.6 oz (96 kg)  05/30/23 219 lb (99.3 kg)  03/28/23 206 lb 9.6 oz (93.7 kg)    Physical Exam Vitals and nursing note reviewed.  Constitutional:      General: She is not in acute distress.    Appearance: Normal appearance. She is obese. She is not ill-appearing.  Neck:     Vascular: No carotid bruit.  Cardiovascular:     Rate and Rhythm: Normal rate and regular rhythm.     Heart sounds: Normal heart sounds. No murmur heard. Pulmonary:     Effort: Pulmonary effort is normal. No respiratory distress.     Breath sounds: Examination of the right-upper field reveals wheezing. Examination of the left-upper field reveals wheezing. Wheezing present.  Abdominal:     General: Bowel sounds are normal.     Palpations: Abdomen is soft.   Musculoskeletal:     Cervical back: Normal range of motion and neck supple.  Lymphadenopathy:     Cervical: No cervical adenopathy.     Right cervical: No superficial cervical adenopathy.    Left cervical: No superficial cervical adenopathy.  Skin:    General: Skin is warm and dry.  Neurological:     General: No focal deficit present.     Mental Status: She is alert and oriented to person, place, and time. Mental status is at baseline.     Deep Tendon Reflexes: Reflexes are normal and symmetric.  Psychiatric:  Attention and Perception: Attention and perception normal.        Mood and Affect: Mood and affect normal.        Speech: Speech normal.        Behavior: Behavior normal. Behavior is cooperative.        Thought Content: Thought content normal.        Cognition and Memory: Cognition and memory normal.        Judgment: Judgment normal.     Results for orders placed or performed in visit on 07/16/23  Bayer DCA Hb A1c Waived   Collection Time: 07/16/23  3:33 PM  Result Value Ref Range   HB A1C (BAYER DCA - WAIVED) 7.5 (H) 4.8 - 5.6 %  Microalbumin, Urine Waived   Collection Time: 07/16/23  3:33 PM  Result Value Ref Range   Microalb, Ur Waived 150 (H) 0 - 19 mg/L   Creatinine, Urine Waived 200 10 - 300 mg/dL   Microalb/Creat Ratio 30-300 (H) <30 mg/g      Assessment & Plan:   Problem List Items Addressed This Visit       Cardiovascular and Mediastinum   Hypertension associated with diabetes (HCC)   Chronic, stable. BP at goal today. Continue current medication regimen. Lisinopril  for kidney protection. Could benefit from switch to ARB due to underlying asthma. Recommend to monitor BP at home a few mornings each week and document for provider. Focus on DASH diet at home. Labs: A1c, CBC, CMP, and microalbumin. Return in 3 months.      Relevant Medications   tirzepatide  (MOUNJARO ) 12.5 MG/0.5ML Pen   Other Relevant Orders   Bayer DCA Hb A1c Waived (Completed)    Microalbumin, Urine Waived (Completed)   CBC with Differential/Platelet   Comprehensive metabolic panel with GFR   TSH     Respiratory   Mild persistent asthma without complication   Chronic, ongoing.  Continue current inhaler regimen as ordered and adjust as needed.  Plan on spirometry at future visit.  Continue modest weight loss.  Labs today: up to date.  Refills on Tessalon  due to intermittent cough with allergies.        Digestive   Gastro-esophageal reflux disease without esophagitis   Chronic, stable. Continue current medication regimen Prilosec 20 mg BID. Recommend focus on diet changes. Remain upright for 30 - 60 mins after meals. Labs: Magnesium. Return in 3 months.      Relevant Orders   Magnesium     Endocrine   Type 2 diabetes mellitus with diabetic polyneuropathy, with long-term current use of insulin  (HCC)   Chronic, ongoing. A1c 7.5% today, trending up. Will adjust Mounjaro  dosage to 12.5 mg weekly. Continue Gabapentin  and B12 for neuropathy. Continue Farxiga  10 mg, Glipizide  10 mg BID, and Semglee  30 units at HS. Recommend to check BS daily and document for provider. Recommend to eat smaller, more frequent meals consisting of high protein and low fats. Exercise 5 times weekly for 30 min per day. Weight loss goal of 10-15 lbs in 3 months.   -Needs updated eye and foot exam. -Continue ACE and Statin -Refuses PCV20 and Flu -Return in 3 months.      Relevant Medications   tirzepatide  (MOUNJARO ) 12.5 MG/0.5ML Pen   Other Relevant Orders   Bayer DCA Hb A1c Waived (Completed)   Microalbumin, Urine Waived (Completed)   Type 2 diabetes mellitus with proteinuria (HCC) - Primary   Chronic, ongoing. A1c 7.5% today, trending up. Will adjust Mounjaro  dosage to  12.5 mg weekly. Continue Gabapentin  and B12 for neuropathy. Continue Farxiga  10 mg, Glipizide  10 mg BID, and Semglee  30 units at HS. Recommend to check BS daily and document for provider. Recommend to eat smaller, more  frequent meals consisting of high protein and low fats. Exercise 5 times weekly for 30 min per day. Weight loss goal of 10-15 lbs in 3 months.    -Needs updated eye and foot exam. -Continue ACE and Statin -Refuses PCV20 and Flu -Return in 3 months.      Relevant Medications   tirzepatide  (MOUNJARO ) 12.5 MG/0.5ML Pen   Other Relevant Orders   Bayer DCA Hb A1c Waived (Completed)   Microalbumin, Urine Waived (Completed)   Hyperlipidemia associated with type 2 diabetes mellitus (HCC)   Chronic, ongoing. Continue current medication regimen. Will adjust as needed. Labs: Lipid panel.       Relevant Medications   tirzepatide  (MOUNJARO ) 12.5 MG/0.5ML Pen   Other Relevant Orders   Bayer DCA Hb A1c Waived (Completed)   Comprehensive metabolic panel with GFR   Lipid Panel w/o Chol/HDL Ratio   Diabetes mellitus treated with injections of non-insulin  medication (HCC)   A1c 7.5% today. Will increase Mounjaro  to 12.5 mg weekly. Return in 3 months.      Relevant Medications   tirzepatide  (MOUNJARO ) 12.5 MG/0.5ML Pen   Other Relevant Orders   Bayer DCA Hb A1c Waived (Completed)   Microalbumin, Urine Waived (Completed)     Nervous and Auditory   Vitamin B12 deficiency neuropathy (HCC)   Ongoing. Continue OTC supplement. This is offering benefit for her neuropathy. Labs: Vitamin B12 today. Return in 3 months.      Relevant Orders   Vitamin B12     Other   Vitamin D  deficiency   Chronic, ongoing. Recommend to continue taking supplement. Labs: Vitamin D  hydroxy. Return in 3 months.      Relevant Orders   VITAMIN D  25 Hydroxy (Vit-D Deficiency, Fractures)   Morbid obesity (HCC)   BMI 38.70. Recommend eating smaller, more frequent meals consisting of high protein, low fats. Exercising 5 times weekly for 30 min per day. Weight loss goal of 10-15 lbs in the next 3 months. Patient verbalized understanding and motivation of these recommendations. Return in 3 months.      Relevant Medications    tirzepatide  (MOUNJARO ) 12.5 MG/0.5ML Pen   Heart murmur, systolic   Ongoing, stable. Denies symptoms at this time.        Anxiety   Chronic, stable.  Was followed by [facility] in past, but PCP currently fills prescriptions.  Will return to them if any worsening mood.  Denies SI/HI.  Continue current medication regimen which offers benefit to her, including Buspar  5 MG BID.        Follow up plan: Return in about 3 months (around 10/15/2023) for T2DM, HTN/HLD, ASTHMA, MOOD.

## 2023-07-16 NOTE — Assessment & Plan Note (Signed)
 BMI 38.70. Recommend eating smaller, more frequent meals consisting of high protein, low fats. Exercising 5 times weekly for 30 min per day. Weight loss goal of 10-15 lbs in the next 3 months. Patient verbalized understanding and motivation of these recommendations. Return in 3 months.

## 2023-07-16 NOTE — Assessment & Plan Note (Signed)
 Chronic, stable. BP at goal today. Continue current medication regimen. Lisinopril  for kidney protection. Could benefit from switch to ARB due to underlying asthma. Recommend to monitor BP at home a few mornings each week and document for provider. Focus on DASH diet at home. Labs: A1c, CBC, CMP, and microalbumin. Return in 3 months.

## 2023-07-16 NOTE — Assessment & Plan Note (Signed)
 Chronic, stable.  Was followed by [facility] in past, but PCP currently fills prescriptions.  Will return to them if any worsening mood.  Denies SI/HI.  Continue current medication regimen which offers benefit to her, including Buspar  5 MG BID.

## 2023-07-16 NOTE — Assessment & Plan Note (Signed)
 Chronic, ongoing. A1c 7.5% today, trending up. Will adjust Mounjaro  dosage to 12.5 mg weekly. Continue Gabapentin  and B12 for neuropathy. Continue Farxiga  10 mg, Glipizide  10 mg BID, and Semglee  30 units at HS. Recommend to check BS daily and document for provider. Recommend to eat smaller, more frequent meals consisting of high protein and low fats. Exercise 5 times weekly for 30 min per day. Weight loss goal of 10-15 lbs in 3 months.   -Needs updated eye and foot exam. -Continue ACE and Statin -Refuses PCV20 and Flu -Return in 3 months.

## 2023-07-16 NOTE — Assessment & Plan Note (Signed)
 Chronic, ongoing. Continue current medication regimen. Will adjust as needed. Labs: Lipid panel.

## 2023-07-16 NOTE — Assessment & Plan Note (Signed)
 Ongoing. Continue OTC supplement. This is offering benefit for her neuropathy. Labs: Vitamin B12 today. Return in 3 months.

## 2023-07-16 NOTE — Assessment & Plan Note (Addendum)
 Chronic, ongoing. Recommend to continue taking supplement. Labs: Vitamin D  hydroxy. Return in 3 months.

## 2023-07-16 NOTE — Assessment & Plan Note (Signed)
 Chronic, stable. Continue current medication regimen Prilosec 20 mg BID. Recommend focus on diet changes. Remain upright for 30 - 60 mins after meals. Labs: Magnesium. Return in 3 months.

## 2023-07-16 NOTE — Progress Notes (Deleted)
 There were no vitals taken for this visit.   Subjective:    Patient ID: Michelle Warner, female    DOB: 1975-08-31, 48 y.o.   MRN: [id number]  HPI: Michelle Warner is a 48 y.o. female  Chief Complaint  Patient presents with   Diabetes   Hypertension   Hyperlipidemia   Gastroesophageal Reflux   Anxiety   DIABETES January A1c 6.5%.  Continues on Mounjaro  10 weekly, Farxiga  10 daily, Glipizide  10 MG BID and Semglee  30 to 34 units at night.  Goal in is to discontinue insulin  in future. Takes Gabapentin  for neuropathy pain + Vitamin B12 supplement + Vitamin D .  Lost total of 36 pounds over past year with use of GLP1 and currently maintaining.   Took Metformin  in past but this caused major GI issues. Hypoglycemic episodes:{Blank single:19197::"yes","no"} Polydipsia/polyuria: {Blank single:19197::"yes","no"} Visual disturbance: {Blank single:19197::"yes","no"} Chest pain: {Blank single:19197::"yes","no"} Paresthesias: {Blank single:19197::"yes","no"} Glucose Monitoring: {Blank single:19197::"yes","no"}  Accucheck frequency: {Blank single:19197::"Not Checking","Daily","BID","TID"}  Fasting glucose:  Post prandial:  Evening:  Before meals: Taking Insulin ?: {Blank single:19197::"yes","no"}  Long acting insulin :  Short acting insulin : Blood Pressure Monitoring: {Blank single:19197::"not checking","rarely","daily","weekly","monthly","a few times a day","a few times a week","a few times a month"} Retinal Examination: {Blank single:19197::"Up to Date","Not up to Date"} Foot Exam: {Blank single:19197::"Up to Date","Not up to Date"} Diabetic Education: {Blank single:19197::"Completed","Not Completed"} Pneumovax: {Blank single:19197::"Up to Date","Not up to Date","unknown"} Influenza: {Blank single:19197::"Up to Date","Not up to Date","unknown"} Aspirin: {Blank single:19197::"yes","no"}   HYPERTENSION / HYPERLIPIDEMIA Taking Lisinopril  20 MG daily and Crestor  10 MG daily.   Satisfied with  current treatment? {Blank single:19197::"yes","no"} Duration of hypertension: {Blank single:19197::"chronic","months","years"} BP monitoring frequency: {Blank single:19197::"not checking","rarely","daily","weekly","monthly","a few times a day","a few times a week","a few times a month"} BP range:  BP medication side effects: {Blank single:19197::"yes","no"} Duration of hyperlipidemia: {Blank single:19197::"chronic","months","years"} Cholesterol medication side effects: {Blank single:19197::"yes","no"} Cholesterol supplements: {Blank multiple:19196::"none","fish oil","niacin","red yeast rice"} Medication compliance: {Blank single:19197::"excellent compliance","good compliance","fair compliance","poor compliance"} Aspirin: {Blank single:19197::"yes","no"} Recent stressors: {Blank single:19197::"yes","no"} Recurrent headaches: {Blank single:19197::"yes","no"} Visual changes: {Blank single:19197::"yes","no"} Palpitations: {Blank single:19197::"yes","no"} Dyspnea: {Blank single:19197::"yes","no"} Chest pain: {Blank single:19197::"yes","no"} Lower extremity edema: {Blank single:19197::"yes","no"} Dizzy/lightheaded: {Blank single:19197::"yes","no"}   GERD Takes Omeprazole  daily. GERD control status: {Blank single:19197::"controlled","uncontrolled","better","worse","exacerbated","stable"} Satisfied with current treatment? {Blank single:19197::"yes","no"} Heartburn frequency:  Medication side effects: {Blank single:19197::"yes","no"}  Medication compliance: {Blank multiple:19196::"better","worse","stable","fluctuating"} Previous GERD medications: Antacid use frequency:   Duration:  Nature:  Location:  Heartburn duration:  Alleviatiating factors:   Aggravating factors:  Dysphagia: {Blank single:19197::"yes","no"} Odynophagia:  {Blank single:19197::"yes","no"} Hematemesis: {Blank single:19197::"yes","no"} Blood in stool: {Blank single:19197::"yes","no"} EGD: {Blank  single:19197::"yes","no"}   DEPRESSION & ANXIETY Taking Celexa , Trazodone , and Buspar .  Saw [facility] in past, but PCP took over treatment years ago.  Mood status: {Blank single:19197::"controlled","uncontrolled","better","worse","exacerbated","stable"} Satisfied with current treatment?: {Blank single:19197::"yes","no"} Symptom severity: {Blank single:19197::"mild","moderate","severe"}  Duration of current treatment : {Blank single:19197::"chronic","months","years"} Side effects: {Blank single:19197::"yes","no"} Medication compliance: {Blank single:19197::"excellent compliance","good compliance","fair compliance","poor compliance"} Psychotherapy/counseling: {Blank single:19197::"yes","no"} {Blank single:19197::"current","in the past"} Depressed mood: {Blank single:19197::"yes","no"} Anxious mood: {Blank single:19197::"yes","no"} Anhedonia: {Blank single:19197::"yes","no"} Significant weight loss or gain: {Blank single:19197::"yes","no"} Insomnia: {Blank single:19197::"yes","no"} {Blank single:19197::"hard to fall asleep","hard to stay asleep"} Fatigue: {Blank single:19197::"yes","no"} Feelings of worthlessness or guilt: {Blank single:19197::"yes","no"} Impaired concentration/indecisiveness: {Blank single:19197::"yes","no"} Suicidal ideations: {Blank single:19197::"yes","no"} Hopelessness: {Blank single:19197::"yes","no"} Crying spells: {Blank single:19197::"yes","no"}    03/28/2023    3:15 PM 12/26/2022    3:50 PM 09/25/2022    2:28 PM 06/26/2022    1:10 PM 02/20/2022    2:31 PM  Depression screen PHQ 2/9  Decreased Interest 1 1  0 0 0  Down, Depressed, Hopeless 2 1 1 1  0  PHQ - 2 Score 3 2 1 1  0  Altered sleeping 1 2 1 1 1   Tired, decreased energy 2 2 1 2 3   Change in appetite 0 0 0 0 0  Feeling bad or failure about yourself  0 0 0 0 0  Trouble concentrating 0 0 0 1 0  Moving slowly or fidgety/restless 0 0 0 0 0  Suicidal thoughts 0 0 0 0 0  PHQ-9 Score 6 6 3 5 4   Difficult doing  work/chores Not difficult at all Not difficult at all Not difficult at all Not difficult at all Not difficult at all       03/28/2023    3:15 PM 12/26/2022    3:50 PM 09/25/2022    2:28 PM 06/26/2022    1:11 PM  GAD 7 : Generalized Anxiety Score  Nervous, Anxious, on Edge 2 1 1 2   Control/stop worrying 3 1 1 1   Worry too much - different things 3 3 1 1   Trouble relaxing 2 2 0 1  Restless 2 1 0 1  Easily annoyed or irritable 3 1 1 2   Afraid - awful might happen 0 0 1 0  Total GAD 7 Score 15 9 5 8   Anxiety Difficulty Not difficult at all Not difficult at all Not difficult at all Not difficult at all      Relevant past medical, surgical, family and social history reviewed and updated as indicated. Interim medical history since our last visit reviewed. Allergies and medications reviewed and updated.  Review of Systems  Per HPI unless specifically indicated above     Objective:    There were no vitals taken for this visit.  Wt Readings from Last 3 Encounters:  05/30/23 219 lb (99.3 kg)  03/28/23 206 lb 9.6 oz (93.7 kg)  01/11/23 206 lb 2.1 oz (93.5 kg)    Physical Exam  Results for orders placed or performed in visit on 03/28/23  Bayer DCA Hb A1c Waived   Collection Time: 03/28/23  3:15 PM  Result Value Ref Range   HB A1C (BAYER DCA - WAIVED) 6.5 (H) 4.8 - 5.6 %  Microalbumin, Urine Waived   Collection Time: 03/28/23  3:15 PM  Result Value Ref Range   Microalb, Ur Waived 150 (H) 0 - 19 mg/L   Creatinine, Urine Waived 200 10 - 300 mg/dL   Microalb/Creat Ratio 30-300 (H) <30 mg/g  Comprehensive metabolic panel   Collection Time: 03/28/23  3:15 PM  Result Value Ref Range   Glucose 147 (H) 70 - 99 mg/dL   BUN 10 6 - 24 mg/dL   Creatinine, Ser 1.19 0.57 - 1.00 mg/dL   eGFR 147 >82 NF/AOZ/3.08   BUN/Creatinine Ratio 14 9 - 23   Sodium 140 134 - 144 mmol/L   Potassium 4.6 3.5 - 5.2 mmol/L   Chloride 103 96 - 106 mmol/L   CO2 23 20 - 29 mmol/L   Calcium  9.3 8.7 - 10.2 mg/dL    Total Protein 6.8 6.0 - 8.5 g/dL   Albumin 4.3 3.9 - 4.9 g/dL   Globulin, Total 2.5 1.5 - 4.5 g/dL   Bilirubin Total 0.3 0.0 - 1.2 mg/dL   Alkaline Phosphatase 62 44 - 121 IU/L   AST 15 0 - 40 IU/L   ALT 16 0 - 32 IU/L  Lipid Panel w/o Chol/HDL Ratio   Collection Time: 03/28/23  3:15 PM  Result Value Ref Range   Cholesterol, Total 127 100 - 199 mg/dL   Triglycerides 295 0 - 149 mg/dL   HDL 43 >28 mg/dL   VLDL Cholesterol Cal 24 5 - 40 mg/dL   LDL Chol Calc (NIH) 60 0 - 99 mg/dL      Assessment & Plan:   Problem List Items Addressed This Visit       Cardiovascular and Mediastinum   Hypertension associated with diabetes (HCC)   Relevant Orders   Bayer DCA Hb A1c Waived   Microalbumin, Urine Waived   CBC with Differential/Platelet   Comprehensive metabolic panel with GFR   TSH     Digestive   Gastro-esophageal reflux disease without esophagitis   Relevant Orders   Magnesium     Endocrine   Type 2 diabetes mellitus with diabetic polyneuropathy, with long-term current use of insulin  (HCC)   Relevant Orders   Bayer DCA Hb A1c Waived   Microalbumin, Urine Waived   Type 2 diabetes mellitus with proteinuria (HCC) - Primary   Relevant Orders   Bayer DCA Hb A1c Waived   Microalbumin, Urine Waived   Hyperlipidemia associated with type 2 diabetes mellitus (HCC)   Relevant Orders   Bayer DCA Hb A1c Waived   Comprehensive metabolic panel with GFR   Lipid Panel w/o Chol/HDL Ratio   Diabetes mellitus treated with injections of non-insulin  medication (HCC)   Relevant Orders   Bayer DCA Hb A1c Waived   Microalbumin, Urine Waived     Nervous and Auditory   Vitamin B12 deficiency neuropathy (HCC)   Relevant Orders   Vitamin B12     Other   Vitamin D  deficiency   Relevant Orders   VITAMIN D  25 Hydroxy (Vit-D Deficiency, Fractures)   Morbid obesity (HCC)   Heart murmur, systolic   Anxiety     Follow up plan: No follow-ups on file.

## 2023-07-16 NOTE — Assessment & Plan Note (Signed)
 Chronic, ongoing.  Continue current inhaler regimen as ordered and adjust as needed.  Plan on spirometry at future visit.  Continue modest weight loss.  Labs today: up to date.  Refills on Tessalon  due to intermittent cough with allergies.

## 2023-07-16 NOTE — Assessment & Plan Note (Signed)
 Ongoing, stable. Denies symptoms at this time.

## 2023-07-16 NOTE — Assessment & Plan Note (Signed)
 A1c 7.5% today. Will increase Mounjaro  to 12.5 mg weekly. Return in 3 months.

## 2023-07-17 ENCOUNTER — Encounter: Payer: Self-pay | Admitting: Nurse Practitioner

## 2023-07-17 LAB — COMPREHENSIVE METABOLIC PANEL WITH GFR
ALT: 23 IU/L (ref 0–32)
AST: 22 IU/L (ref 0–40)
Albumin: 4.4 g/dL (ref 3.9–4.9)
Alkaline Phosphatase: 75 IU/L (ref 44–121)
BUN/Creatinine Ratio: 21 (ref 9–23)
BUN: 15 mg/dL (ref 6–24)
Bilirubin Total: 0.3 mg/dL (ref 0.0–1.2)
CO2: 22 mmol/L (ref 20–29)
Calcium: 9.2 mg/dL (ref 8.7–10.2)
Chloride: 105 mmol/L (ref 96–106)
Creatinine, Ser: 0.7 mg/dL (ref 0.57–1.00)
Globulin, Total: 2.5 g/dL (ref 1.5–4.5)
Glucose: 145 mg/dL — ABNORMAL HIGH (ref 70–99)
Potassium: 4.6 mmol/L (ref 3.5–5.2)
Sodium: 138 mmol/L (ref 134–144)
Total Protein: 6.9 g/dL (ref 6.0–8.5)
eGFR: 108 mL/min/{1.73_m2} (ref >59)

## 2023-07-17 LAB — CBC WITH DIFFERENTIAL/PLATELET
Basophils Absolute: 0 10*3/uL (ref 0.0–0.2)
Basos: 0 %
EOS (ABSOLUTE): 0.1 10*3/uL (ref 0.0–0.4)
Eos: 2 %
Hematocrit: 45.3 % (ref 34.0–46.6)
Hemoglobin: 14.7 g/dL (ref 11.1–15.9)
Immature Grans (Abs): 0 10*3/uL (ref 0.0–0.1)
Immature Granulocytes: 0 %
Lymphocytes Absolute: 2.3 10*3/uL (ref 0.7–3.1)
Lymphs: 33 %
MCH: 29.9 pg (ref 26.6–33.0)
MCHC: 32.5 g/dL (ref 31.5–35.7)
MCV: 92 fL (ref 79–97)
Monocytes Absolute: 0.3 10*3/uL (ref 0.1–0.9)
Monocytes: 5 %
Neutrophils Absolute: 4.3 10*3/uL (ref 1.4–7.0)
Neutrophils: 60 %
Platelets: 353 10*3/uL (ref 150–450)
RBC: 4.92 x10E6/uL (ref 3.77–5.28)
RDW: 12.1 % (ref 11.7–15.4)
WBC: 7.1 10*3/uL (ref 3.4–10.8)

## 2023-07-17 LAB — TSH: TSH: 1.32 u[IU]/mL (ref 0.450–4.500)

## 2023-07-17 LAB — LIPID PANEL W/O CHOL/HDL RATIO
Cholesterol, Total: 145 mg/dL (ref 100–199)
HDL: 47 mg/dL (ref >39)
LDL Chol Calc (NIH): 67 mg/dL (ref 0–99)
Triglycerides: 185 mg/dL — ABNORMAL HIGH (ref 0–149)
VLDL Cholesterol Cal: 31 mg/dL (ref 5–40)

## 2023-07-17 LAB — VITAMIN D 25 HYDROXY (VIT D DEFICIENCY, FRACTURES): Vit D, 25-Hydroxy: 50.9 ng/mL (ref 30.0–100.0)

## 2023-07-17 LAB — VITAMIN B12: Vitamin B-12: 459 pg/mL (ref 232–1245)

## 2023-07-17 LAB — MAGNESIUM: Magnesium: 2.1 mg/dL (ref 1.6–2.3)

## 2023-07-18 ENCOUNTER — Other Ambulatory Visit: Payer: Self-pay

## 2023-07-18 NOTE — Progress Notes (Signed)
   07/18/2023  Patient ID: Michelle Warner, female   DOB: January 31, 1976, 48 y.o.   MRN: [id number]  Subjective/Objective Telephone visit to follow-up on management of diabetes   Diabetes Management Plan -Current medications:  Mounjaro  10mg  weekly, Semglee  30 units daily, glipizide  10mg  BID, Farxiga  10mg  daily -Patient has tried metformin  several times in the past and cannot tolerate -PCP follow-up earlier this week, and A1c was elevated at 7.5%, up from 6.5% -PCP increased Mounjaro  to 12.5mg  weekly, but patient states she has 2-3 pens of 10mg  at home she will complete before increasing dose -Libre 3 for CGM was also prescribed, but patient not yet received or started using -Continues to monitor FBG at home regularly and states this morning it was 85.  Previously elevated due to steroid use for URI.   Assessment/Plan   Diabetes Management Plan -Once 10mg  pens of Mounjaro  on hand are completed, increase to 12.5mg  weekly -Continue Farxiga  10mg  daily, glipizide  10mg  BID -Continue Semglee  30 units at bedtime, but decrease after increasing to Mounjaro  12.5mg  if FBG consistently <80 to prevent hypoglycemia -Continue daily monitoring of FBG until CGM with Jerrilyn Moras is initiated- once using, I will send patient email to link data to [facility] dashboard  Follow-up:  4 weeks   Linn Rich, PharmD, DPLA

## 2023-08-03 ENCOUNTER — Other Ambulatory Visit: Payer: Self-pay | Admitting: Nurse Practitioner

## 2023-08-03 DIAGNOSIS — Z794 Long term (current) use of insulin: Secondary | ICD-10-CM

## 2023-08-03 DIAGNOSIS — E113293 Type 2 diabetes mellitus with mild nonproliferative diabetic retinopathy without macular edema, bilateral: Secondary | ICD-10-CM

## 2023-08-05 ENCOUNTER — Other Ambulatory Visit: Payer: Self-pay | Admitting: Nurse Practitioner

## 2023-08-07 NOTE — Telephone Encounter (Signed)
 Requested Prescriptions  Pending Prescriptions Disp Refills   lisinopril  (ZESTRIL ) 20 MG tablet [Pharmacy Med Name: Lisinopril  20 MG Oral Tablet] 90 tablet 1    Sig: Take 1 tablet by mouth once daily     Cardiovascular:  ACE Inhibitors Failed - 08/07/2023  5:31 PM      Failed - Valid encounter within last 6 months    Recent Outpatient Visits           3 weeks ago Type 2 diabetes mellitus with proteinuria (HCC)   [place] [facility] [place], [place] T, NP              Passed - Cr in normal range and within 180 days    Creatinine  Date Value Ref Range Status  08/24/2011 0.71 0.60 - 1.30 mg/dL Final   Creatinine, Ser  Date Value Ref Range Status  07/16/2023 0.70 0.57 - 1.00 mg/dL Final         Passed - K in normal range and within 180 days    Potassium  Date Value Ref Range Status  07/16/2023 4.6 3.5 - 5.2 mmol/L Final  08/24/2011 4.0 3.5 - 5.1 mmol/L Final         Passed - Patient is not pregnant      Passed - Last BP in normal range    BP Readings from Last 1 Encounters:  07/16/23 98/62          FARXIGA  10 MG TABS tablet [Pharmacy Med Name: Farxiga  10 MG Oral Tablet] 90 tablet 1    Sig: TAKE 1 TABLET BY MOUTH ONCE DAILY BEFORE BREAKFAST     Endocrinology:  Diabetes - SGLT2 Inhibitors Failed - 08/07/2023  5:31 PM      Failed - Valid encounter within last 6 months    Recent Outpatient Visits           3 weeks ago Type 2 diabetes mellitus with proteinuria (HCC)   [facility] [place], [place] T, NP              Passed - Cr in normal range and within 360 days    Creatinine  Date Value Ref Range Status  08/24/2011 0.71 0.60 - 1.30 mg/dL Final   Creatinine, Ser  Date Value Ref Range Status  07/16/2023 0.70 0.57 - 1.00 mg/dL Final         Passed - HBA1C is between 0 and 7.9 and within 180 days    HB A1C (BAYER DCA - WAIVED)  Date Value Ref Range Status  07/16/2023 7.5 (H) 4.8 - 5.6 % Final    Comment:              Prediabetes: 5.7 - 6.4          Diabetes: >6.4          Glycemic control for adults with diabetes: <7.0          Passed - eGFR in normal range and within 360 days    EGFR (African American)  Date Value Ref Range Status  08/24/2011 >60  Final   GFR calc Af Amer  Date Value Ref Range Status  10/01/2018 >60 >60 mL/min Final   EGFR (Non-African Amer.)  Date Value Ref Range Status  08/24/2011 >60  Final    Comment:    eGFR values <36mL/min/1.73 m2 may be an indication of chronic kidney disease (CKD). Calculated eGFR is useful in patients with stable renal function. The eGFR calculation will  not be reliable in acutely ill patients when serum creatinine is changing rapidly. It is not useful in  patients on dialysis. The eGFR calculation may not be applicable to patients at the low and high extremes of body sizes, pregnant women, and vegetarians.    GFR, Estimated  Date Value Ref Range Status  01/11/2023 >60 >60 mL/min Final    Comment:    (NOTE) Calculated using the CKD-EPI Creatinine Equation (2021)    eGFR  Date Value Ref Range Status  07/16/2023 108 >59 mL/min/1.73 Final          omeprazole  (PRILOSEC) 20 MG capsule [Pharmacy Med Name: Omeprazole  20 MG Oral Capsule Delayed Release] 180 capsule 2    Sig: Take 1 capsule by mouth twice daily     Gastroenterology: Proton Pump Inhibitors Failed - 08/07/2023  5:31 PM      Failed - Valid encounter within last 12 months    Recent Outpatient Visits           3 weeks ago Type 2 diabetes mellitus with proteinuria (HCC)   [place] [facility] [place], [place] T, NP               rosuvastatin  (CRESTOR ) 20 MG tablet [Pharmacy Med Name: Rosuvastatin  Calcium  20 MG Oral Tablet] 90 tablet 2    Sig: Take 1 tablet by mouth once daily     Cardiovascular:  Antilipid - Statins 2 Failed - 08/07/2023  5:31 PM      Failed - Valid encounter within last 12 months    Recent Outpatient Visits           3 weeks ago Type  2 diabetes mellitus with proteinuria (HCC)   [place] [facility] [place], Sanjuan [place] T, NP              Failed - Lipid Panel in normal range within the last 12 months    Cholesterol, Total  Date Value Ref Range Status  07/16/2023 145 100 - 199 mg/dL Final   LDL Chol Calc (NIH)  Date Value Ref Range Status  07/16/2023 67 0 - 99 mg/dL Final   LDL Direct  Date Value Ref Range Status  10/02/2017 122 (H) 0 - 99 mg/dL Final   HDL  Date Value Ref Range Status  07/16/2023 47 >39 mg/dL Final   Triglycerides  Date Value Ref Range Status  07/16/2023 185 (H) 0 - 149 mg/dL Final         Passed - Cr in normal range and within 360 days    Creatinine  Date Value Ref Range Status  08/24/2011 0.71 0.60 - 1.30 mg/dL Final   Creatinine, Ser  Date Value Ref Range Status  07/16/2023 0.70 0.57 - 1.00 mg/dL Final         Passed - Patient is not pregnant

## 2023-08-08 ENCOUNTER — Other Ambulatory Visit (HOSPITAL_COMMUNITY): Payer: Self-pay

## 2023-08-09 ENCOUNTER — Other Ambulatory Visit (HOSPITAL_COMMUNITY): Payer: Self-pay

## 2023-08-09 ENCOUNTER — Telehealth: Payer: Self-pay

## 2023-08-09 NOTE — Telephone Encounter (Signed)
 Pharmacy Patient Advocate Encounter   Received notification from CoverMyMeds that prior authorization for Farxiga  10MG  tablets is required/requested.   Insurance verification completed.   The patient is insured through [employer] .   Per test claim: PA required; PA started via CoverMyMeds. KEY [id number] . Waiting for clinical questions to populate.

## 2023-08-10 NOTE — Telephone Encounter (Signed)
 Answered clinical questions and submitted PA, currently pending

## 2023-08-15 ENCOUNTER — Other Ambulatory Visit (HOSPITAL_COMMUNITY): Payer: Self-pay

## 2023-08-15 NOTE — Telephone Encounter (Signed)
 Pharmacy Patient Advocate Encounter  Received notification from [facility] that Prior Authorization for Farxiga  10MG  tablets has been APPROVED from 08/10/2023 to 08/09/2024   PA #/Case ID/Reference #: [id number]

## 2023-08-29 ENCOUNTER — Other Ambulatory Visit: Payer: MEDICAID

## 2023-08-29 NOTE — Progress Notes (Signed)
   08/29/2023  Patient ID: Michelle Warner, female   DOB: 1976-02-27, 48 y.o.   MRN: [id number]  Subjective/Objective Telephone visit to follow-up on management of diabetes   Diabetes Management Plan -Current medications:  Mounjaro  12.5mg  weekly, Semglee  30 units daily, glipizide  10mg  BID, Farxiga  10mg  daily -Patient has tried metformin  several times in the past and cannot tolerate -Has had 2 doses of Mounjaro  12.5mg  at this time -A1c was elevated recently at 7.5%, up from 6.5% -Libre 3 for CGM was recently prescribed, but patient has not yet received this -Continues to monitor FBG at home regularly and states this is averaging 85-105 with some readings in the 70's where she has s/sx of hypoglycemia (shaking and sweating)  Diarrhea -Patient reports loose, watery stools 3-4x/daily over the past week -Does not endorse any n/v or fever -States she has used gas x and some other OTC medications but unable to provide names -Has been unable to make it to the bathroom on time a couple of instances   Assessment/Plan   Diabetes Management Plan -Continue Farxiga  10mg  daily, glipizide  10mg  BID, and Mounjaro  12.5mg   -Decrease Semglee  to 28 units at bedtime to prevent hypoglycemia -Libre 3 reader and sensors require a PA; coordinating with PA team to assist -Sees PCP again 8/5 and will be due for A1c  Diarrhea -Likely unrelated to Mounjaro  increase, as patient has been tolerating medication well through dose titration -Patient states symptoms are similar to what she experienced last year- chart notes reflect E.Coli in stool and patient treated with single dose azithromycin  -Informing PCP to see if she would like to prescribe loperamide for a few days or see if patient can be seen   Follow-up:  Will notify patient once PA approved for [facility] and schedule follow-up at that time   Linn Rich, PharmD, DPLA

## 2023-08-30 ENCOUNTER — Telehealth: Payer: Self-pay

## 2023-08-30 ENCOUNTER — Other Ambulatory Visit (HOSPITAL_COMMUNITY): Payer: Self-pay

## 2023-08-30 NOTE — Progress Notes (Signed)
 PA request has been Submitted. New Encounter has been or will be created for follow up. For additional info see Pharmacy Prior Auth telephone encounter from 08/30/2023.

## 2023-08-30 NOTE — Progress Notes (Signed)
 Appt scheduled

## 2023-08-30 NOTE — Telephone Encounter (Signed)
 Pharmacy Patient Advocate Encounter   Received notification from Physician's Office that prior authorization for FreeStyle Libre 3 Plus Sensor is required/requested.   Insurance verification completed.   The patient is insured through [employer] .   Per test claim: PA required; PA submitted to above mentioned insurance via CoverMyMeds Key/confirmation #/EOC [id number] Status is pending

## 2023-08-31 ENCOUNTER — Other Ambulatory Visit (HOSPITAL_COMMUNITY): Payer: Self-pay

## 2023-08-31 ENCOUNTER — Telehealth: Payer: Self-pay

## 2023-08-31 NOTE — Telephone Encounter (Signed)
 Pharmacy Patient Advocate Encounter  Received notification from [facility] that Prior Authorization for FreeStyle Libre 3 Plus Sensor has been APPROVED from 08/30/23 to 02/29/24. Ran test claim, Copay is $0.00. This test claim was processed through [facility]- copay amounts may vary at other pharmacies due to pharmacy/plan contracts, or as the patient moves through the different stages of their insurance plan.   PA #/Case ID/Reference #: [id number]

## 2023-08-31 NOTE — Progress Notes (Signed)
   08/31/2023  Patient ID: Michelle Warner, female   DOB: April 26, 1975, 48 y.o.   MRN: [id number]  Insurance has approved coverage for Nevis 3 reader and sensors, and [facility] is filling prescriptions currently.  Both are going through on insurance for $0 copay and will be ready later this afternoon.  Contacted patient to make her aware and offer in person visit to assist with set up and use; but patient's husband also uses [address] and will assist with set up if needed per patient.  Telephone follow-up visit scheduled for 7/11 and patient sees PCP 6/25.  Linn Rich, PharmD, DPLA

## 2023-09-08 NOTE — Patient Instructions (Incomplete)
 Be Involved in Caring For Your Health:  Taking Medications When medications are taken as directed, they can greatly improve your health. But if they are not taken as prescribed, they may not work. In some cases, not taking them correctly can be harmful. To help ensure your treatment remains effective and safe, understand your medications and how to take them. Bring your medications to each visit for review by your provider.  Your lab results, notes, and after visit summary will be available on My Chart. We strongly encourage you to use this feature. If lab results are abnormal the clinic will contact you with the appropriate steps. If the clinic does not contact you assume the results are satisfactory. You can always view your results on My Chart. If you have questions regarding your health or results, please contact the clinic during office hours. You can also ask questions on My Chart.  We at [facility] are grateful that you chose us  to provide your care. We strive to provide evidence-based and compassionate care and are always looking for feedback. If you get a survey from the clinic please complete this so we can hear your opinions.  Diarrhea, Adult Diarrhea is when you pass loose and sometimes watery poop (stool) often. Diarrhea can make you feel weak and cause you to lose water in your body (get dehydrated). Losing water in your body can cause you to: Feel tired and thirsty. Have a dry mouth. Go pee (urinate) less often. Diarrhea often lasts 2-3 days. It can last longer if it is a sign of something more serious. Be sure to treat your diarrhea as told by your doctor. Follow these instructions at home: Eating and drinking     Follow these instructions as told by your doctor: Take an ORS (oral rehydration solution). This is a drink that helps you replace fluids and minerals your body lost. It is sold at pharmacies and stores. Drink enough fluid to keep your pee (urine) pale  yellow. Drink fluids such as: Water. You can also get fluids by sucking on ice chips. Diluted fruit juice. Low-calorie sports drinks. Milk. Avoid drinking fluids that have a lot of sugar or caffeine in them. These include soda, energy drinks, and regular sports drinks. Avoid alcohol. Eat bland, easy-to-digest foods in small amounts as you are able. These foods include: Bananas. Applesauce. Rice. Low-fat (lean) meats. Toast. Crackers. Avoid spicy or fatty foods.  Medicines Take over-the-counter and prescription medicines only as told by your doctor. If you were prescribed antibiotics, take them as told by your doctor. Do not stop taking them even if you start to feel better. General instructions  Wash your hands often using soap and water for 20 seconds. If soap and water are not available, use hand sanitizer. Others in your home should wash their hands as well. Wash your hands: After using the toilet or changing a diaper. Before preparing, cooking, or serving food. While caring for a sick person. While visiting someone in a hospital. Rest at home while you get better. Take a warm bath to help with any burning or pain from having diarrhea. Watch your condition for any changes. Contact a doctor if: You have a fever. Your diarrhea gets worse. You have new symptoms. You vomit every time you eat or drink. You feel light-headed, dizzy, or you have a headache. You have muscle cramps. You have signs of losing too much water in your body, such as: Dark pee, very little pee, or no pee.  Cracked lips. Dry mouth. Sunken eyes. Sleepiness. Weakness. You have bloody or black poop or poop that looks like tar. You have very bad pain, cramping, or bloating in your belly (abdomen). Your skin feels cold and clammy. You feel confused. Get help right away if: You have chest pain. Your heart is beating very quickly. You have trouble breathing or you are breathing very quickly. You feel  very weak or you faint. These symptoms may be an emergency. Get help right away. Call 911. Do not wait to see if the symptoms will go away. Do not drive yourself to the hospital. This information is not intended to replace advice given to you by your health care provider. Make sure you discuss any questions you have with your health care provider. Document Revised: 08/23/2021 Document Reviewed: 08/23/2021 Elsevier Patient Education  2024 [employer].

## 2023-09-12 ENCOUNTER — Ambulatory Visit: Payer: MEDICAID | Admitting: Nurse Practitioner

## 2023-09-13 NOTE — Telephone Encounter (Signed)
 ERROR

## 2023-09-14 ENCOUNTER — Ambulatory Visit: Payer: MEDICAID | Admitting: Nurse Practitioner

## 2023-09-28 ENCOUNTER — Other Ambulatory Visit: Payer: MEDICAID

## 2023-09-28 ENCOUNTER — Other Ambulatory Visit: Payer: Self-pay | Admitting: Nurse Practitioner

## 2023-10-01 NOTE — Telephone Encounter (Signed)
 Requested Prescriptions  Pending Prescriptions Disp Refills   gabapentin  (NEURONTIN ) 300 MG capsule [Pharmacy Med Name: Gabapentin  300 MG Oral Capsule] 270 capsule 1    Sig: TAKE 1 CAPSULE BY MOUTH THREE TIMES DAILY     Neurology: Anticonvulsants - gabapentin  Passed - 10/01/2023  2:22 PM      Passed - Cr in normal range and within 360 days    Creatinine  Date Value Ref Range Status  08/24/2011 0.71 0.60 - 1.30 mg/dL Final   Creatinine, Ser  Date Value Ref Range Status  07/16/2023 0.70 0.57 - 1.00 mg/dL Final         Passed - Completed PHQ-2 or PHQ-9 in the last 360 days      Passed - Valid encounter within last 12 months    Recent Outpatient Visits           2 months ago Type 2 diabetes mellitus with proteinuria (HCC)   [place] [facility] [place], Melanie DASEN, NP

## 2023-10-02 ENCOUNTER — Other Ambulatory Visit: Payer: MEDICAID

## 2023-10-02 NOTE — Progress Notes (Unsigned)
   10/02/2023  Patient ID: Michelle Warner, female   DOB: January 02, 1976, 48 y.o.   MRN: [id number]  Patient outreach attempt to follow-up on management of diabetes was unsuccessful, but I was able to leave HIPAA compliant voicemail with my direct phone number.  If I do not hear back from patient, I will attempt to contact again next week.  [name], PharmD, DPLA

## 2023-10-09 ENCOUNTER — Other Ambulatory Visit: Payer: Self-pay

## 2023-10-09 DIAGNOSIS — F439 Reaction to severe stress, unspecified: Secondary | ICD-10-CM

## 2023-10-09 NOTE — Progress Notes (Signed)
   10/09/2023  Patient ID: Rock MARLA Franks, female   DOB: [birth year], 48 y.o.   MRN: [id number]  Subjective/Objective Telephone visit to follow-up on management of diabetes   Diabetes Management Plan -Current medications:  Mounjaro  12.5mg  weekly, Semglee  28 units daily, glipizide  10mg  BID, Farxiga  10mg  daily -Patient has tried metformin  several times in the past and cannot tolerate -Patient is tolerating Mounjaro  12.5mg  weekly but does still endorse some stomach pain; unable to described if this is a cramping sensation but does state it feels similar to heart burn.  Taking omeprazole  20mg  BID.  Patient also states she is currently under a lot of stress, which is likely contributing to stomach upset.  Diarrhea has resolved. -A1c was elevated recently at 7.5%, up from 6.5% -Able to obtain and start using Libre 3 sensors and is using mobile app on phone as reader -Recent FBG readings are as follows:  77, 65, 61, 85, 87, 80- patient does state she feels hypoglycemic when readings are in the 60's  Financial Stressors -Patient states daughter recently broke her leg and they are not sure how they will afford her getting this casted.  This is causing increased stress and worry and increasing patient's emotions -Patient is curently prescribed citalopram  20mg  daily and buspirone  5mg  BID to help with depression/axiety  Asthma -Current medications: albuterol  rescue inhaler PRN, albuterol  nebulizer solution PRN, QVAR  40mcg 2 puffs BID -Patient states she feels like cough has worsened since starting QVAR  a few months ago- she will have coughing fits some nights before bed for almost 2 hours   Assessment/Plan   Diabetes Management Plan -Continue Farxiga  10mg  daily, glipizide  10mg  BID, and Mounjaro  12.5mg   -Decrease Semglee  to 24 units at bedtime to prevent hypoglycemia -Sent email link for patient to share Pray data with [facility] [employer] -Sees PCP again 8/5 and will be due for A1c   Financial  Stressors -Referral placed for SW; patient open to telephone appointment being scheduled to discuss resources in regard to financial stressors -Advised patient to discuss potential dose increase on citalopram  and/or buspirone  at upcoming PCP visit  Asthma -Continue current regimen at this time -Coordinating with PCP before patient's upcoming visit 8/5 to inform her what alternatives to QVAR  would be covered by patient's insurance, so options can be discussed at visit  Follow-up:  6 weeks   [name], PharmD, DPLA

## 2023-10-10 ENCOUNTER — Telehealth: Payer: Self-pay

## 2023-10-10 NOTE — Progress Notes (Signed)
 Complex Care Management Note  Care Guide Note 10/10/2023 Name: Michelle Warner MRN: [id number] DOB: 01/30/1976  [name] is a 48 y.o. year old female who sees [place], Melanie T, NP for primary care. I reached out to [name] by phone today to offer complex care management services.  Michelle Warner was given information about Complex Care Management services today including:   The Complex Care Management services include support from the care team which includes your Nurse Care Manager, Clinical Social Worker, or Pharmacist.  The Complex Care Management team is here to help remove barriers to the health concerns and goals most important to you. Complex Care Management services are voluntary, and the patient may decline or stop services at any time by request to their care team member.   Complex Care Management Consent Status: Patient agreed to services and verbal consent obtained.   Follow up plan:  Telephone appointment with complex care management team member scheduled for:  11/06/2023  Encounter Outcome:  Patient Scheduled  Jeoffrey Buffalo , RMA     [place]  [facility], [facility] Guide  Direct Dial: [phone]  Website: [email or web address]

## 2023-10-19 NOTE — Patient Instructions (Signed)
Be Involved in Caring For Your Health:  Taking Medications When medications are taken as directed, they can greatly improve your health. But if they are not taken as prescribed, they may not work. In some cases, not taking them correctly can be harmful. To help ensure your treatment remains effective and safe, understand your medications and how to take them. Bring your medications to each visit for review by your provider.  Your lab results, notes, and after visit summary will be available on My Chart. We strongly encourage you to use this feature. If lab results are abnormal the clinic will contact you with the appropriate steps. If the clinic does not contact you assume the results are satisfactory. You can always view your results on My Chart. If you have questions regarding your health or results, please contact the clinic during office hours. You can also ask questions on My Chart.  We at [facility] are grateful that you chose [place] to provide your care. We strive to provide evidence-based and compassionate care and are always looking for feedback. If you get a survey from the clinic please complete this so we can hear your opinions.  Diabetes Mellitus and Exercise Regular exercise is important for your health, especially if you have diabetes mellitus. Exercise is not just about losing weight. It can also help you increase muscle strength and bone density and reduce body fat and stress. This can help your level of endurance and make you more fit and flexible. Why should I exercise if I have diabetes? Exercise has many benefits for people with diabetes. It can: Help lower and control your blood sugar (glucose). Help your body respond better and become more sensitive to the hormone insulin. Reduce how much insulin your body needs. Lower your risk for heart disease by: Lowering how much "bad" cholesterol and triglycerides you have in your body. Increasing how much "good" cholesterol  you have in your body. Lowering your blood pressure. Lowering your blood glucose levels. What is my activity plan? Your health care provider or an expert trained in diabetes care (certified diabetes educator) can help you make an activity plan. This plan can help you find the type of exercise that works for you. It may also tell you how often to exercise and for how long. Be sure to: Get at least 150 minutes of medium-intensity or high-intensity exercise each week. This may involve brisk walking, biking, or water aerobics. Do stretching and strengthening exercises at least 2 times a week. This may involve yoga or weight lifting. Spread out your activity over at least 3 days of the week. Get some form of physical activity each day. Do not go more than 2 days in a row without some kind of activity. Avoid being inactive for more than 30 minutes at a time. Take frequent breaks to walk or stretch. Choose activities that you enjoy. Set goals that you know you can accomplish. Start slowly and increase the intensity of your exercise over time. How do I manage my diabetes during exercise?  Monitor your blood glucose Check your blood glucose before and after you exercise. If your blood glucose is 240 mg/dL (40.9 mmol/L) or higher before you exercise, check your urine for ketones. These are chemicals created by the liver. If you have ketones in your urine, do not exercise until your blood glucose returns to normal. If your blood glucose is 100 mg/dL (5.6 mmol/L) or lower, eat a snack that has 15-20 grams of carbohydrate in  it. Check your blood glucose 15 minutes after the snack to make sure that your level is above 100 mg/dL (5.6 mmol/L) before you start to exercise. Your risk for low blood glucose (hypoglycemia) goes up during and after exercise. Know the symptoms of this condition and how to treat it. Follow these instructions at home: Keep a carbohydrate snack on hand for use before, during, and after  exercise. This can help prevent or treat hypoglycemia. Avoid injecting insulin into parts of your body that are going to be used during exercise. This may include: Your arms, when you are going to play tennis. Your legs, when you are about to go jogging. Keep track of your exercise habits. This can help you and your health care provider watch and adjust your activity plan. Write down: What you eat before and after you exercise. Blood glucose levels before and after you exercise. The type and amount of exercise you do. Talk to your health care provider before you start a new activity. They may need to: Make sure that the activity is safe for you. Adjust your insulin, other medicines, and food that you eat. Drink water while you exercise. This can stop you from losing too much water (dehydration). It can also prevent problems caused by having a lot of heat in your body (heat stroke). Where to find more information American Diabetes Association: diabetes.org Association of Diabetes Care & Education Specialists: diabeteseducator.org This information is not intended to replace advice given to you by your health care provider. Make sure you discuss any questions you have with your health care provider. Document Revised: 08/24/2021 Document Reviewed: 08/24/2021 Elsevier Patient Education  2024 [employer].

## 2023-10-23 ENCOUNTER — Encounter: Payer: Self-pay | Admitting: Nurse Practitioner

## 2023-10-23 ENCOUNTER — Ambulatory Visit: Payer: MEDICAID | Admitting: Nurse Practitioner

## 2023-10-23 VITALS — BP 121/82 | HR 80 | Temp 97.9°F | Ht 62.0 in | Wt 205.8 lb

## 2023-10-23 DIAGNOSIS — E119 Type 2 diabetes mellitus without complications: Secondary | ICD-10-CM

## 2023-10-23 DIAGNOSIS — Z7985 Long-term (current) use of injectable non-insulin antidiabetic drugs: Secondary | ICD-10-CM

## 2023-10-23 DIAGNOSIS — E1169 Type 2 diabetes mellitus with other specified complication: Secondary | ICD-10-CM

## 2023-10-23 DIAGNOSIS — R809 Proteinuria, unspecified: Secondary | ICD-10-CM

## 2023-10-23 DIAGNOSIS — R011 Cardiac murmur, unspecified: Secondary | ICD-10-CM

## 2023-10-23 DIAGNOSIS — K219 Gastro-esophageal reflux disease without esophagitis: Secondary | ICD-10-CM

## 2023-10-23 DIAGNOSIS — E1159 Type 2 diabetes mellitus with other circulatory complications: Secondary | ICD-10-CM

## 2023-10-23 DIAGNOSIS — J4530 Mild persistent asthma, uncomplicated: Secondary | ICD-10-CM

## 2023-10-23 DIAGNOSIS — E1142 Type 2 diabetes mellitus with diabetic polyneuropathy: Secondary | ICD-10-CM

## 2023-10-23 DIAGNOSIS — E1129 Type 2 diabetes mellitus with other diabetic kidney complication: Secondary | ICD-10-CM

## 2023-10-23 DIAGNOSIS — I152 Hypertension secondary to endocrine disorders: Secondary | ICD-10-CM

## 2023-10-23 DIAGNOSIS — Z794 Long term (current) use of insulin: Secondary | ICD-10-CM

## 2023-10-23 DIAGNOSIS — F419 Anxiety disorder, unspecified: Secondary | ICD-10-CM

## 2023-10-23 LAB — BAYER DCA HB A1C WAIVED: HB A1C (BAYER DCA - WAIVED): 6.7 % — ABNORMAL HIGH (ref 4.8–5.6)

## 2023-10-23 MED ORDER — FLUTICASONE-SALMETEROL 115-21 MCG/ACT IN AERO: 2.0000 | INHALATION_SPRAY | Freq: Two times a day (BID) | RESPIRATORY_TRACT | 12 refills | Status: DC

## 2023-10-23 MED ORDER — ONETOUCH ULTRASOFT LANCETS MISC: 12 refills | Status: DC

## 2023-10-23 NOTE — Assessment & Plan Note (Signed)
 Chronic, ongoing.  Continue current medication regimen and adjust as needed. Lipid panel today.

## 2023-10-23 NOTE — Progress Notes (Signed)
 BP 121/82   Pulse 80   Temp 97.9 F (36.6 C) (Oral)   Ht 5' 2 (1.575 m)   Wt 205 lb 12.8 oz (93.4 kg)   SpO2 98%   BMI 37.64 kg/m    Subjective:    Patient ID: Michelle Warner, female    DOB: [birth year], 48 y.o.   MRN: [id number]  HPI: Michelle Warner is a 48 y.o. female  Chief Complaint  Patient presents with   Asthma   Depression   Diabetes    No recent eye exam per patient   Hyperlipidemia   Hypertension   DIABETES A1c in April was 7.5%.  Takes Mounjaro  12.5 MG weekly, Farxiga  10 daily, Glipizide  10 MG BID and Lantus  20 units at night.  Works with [facility] PharmD [place]. Goal in long run is to reduce insulin  and discontinue if possible. Continues Gabapentin  for neuropathy pain + Vitamin B12 supplements. Continues to lose weight, has lost 6 lbs.  Took Metformin  in past but this caused major GI issues. Hypoglycemic episodes:no Polydipsia/polyuria: no Visual disturbance: no Chest pain: no Paresthesias: no Glucose Monitoring: yes  Accucheck frequency: Daily  Fasting glucose: 64 to 100  Post prandial:  Evening:  Before meals: Taking Insulin ?: yes  Long acting insulin : 20 Lantus   Short acting insulin : Blood Pressure Monitoring: not checking Retinal Examination: Not Up to Date -- [facility] Foot Exam: Up to Date today Pneumovax: Refuses Influenza: Refuses Aspirin: no   HYPERTENSION / HYPERLIPIDEMIA Taking Lisinopril  20 MG daily and Crestor  20 MG daily.  History of sleep study with recommendation for weight loss, no CPAP. Satisfied with current treatment? yes Duration of hypertension: chronic BP monitoring frequency: not checking BP range:  BP medication side effects: no Duration of hyperlipidemia: chronic Cholesterol medication side effects: no Cholesterol supplements: none Medication compliance: good compliance Aspirin: no Recent stressors: no Recurrent headaches: no Visual changes: no Palpitations: no Dyspnea: no Chest pain: no Lower extremity  edema: no Dizzy/lightheaded: no   ASTHMA Uses Qvar  2 puffs BID (coughs more when uses this) + Albuterol  PRN.  Asthma status: stable Satisfied with current treatment?: yes Albuterol /rescue inhaler frequency: once a month or less Dyspnea frequency: no Wheezing frequency: no Cough frequency: after uses Qvar  Nocturnal symptom frequency: no Limitation of activity: no Current upper respiratory symptoms: no Triggers: pollen Home peak flows: none Aerochamber/spacer use: no Visits to ER or Urgent Care in past year: no Pneumovax: refused Influenza: refused   DEPRESSION Continues Celexa , Buspar , and Trazodone . Some anxiety due to her daughter being injured and being out of work for two months. Mood status: stable Satisfied with current treatment?: yes Symptom severity: moderate  Duration of current treatment : chronic Side effects: no Medication compliance: good compliance Psychotherapy/counseling: none Depressed mood: sometimes Anxious mood: yes Anhedonia: no Significant weight loss or gain: no Insomnia: yes hard to fall asleep -- uses Trazodone  as needed Fatigue: no Feelings of worthlessness or guilt: no Impaired concentration/indecisiveness: no Suicidal ideations: no Hopelessness: no Crying spells: no    10/23/2023    3:50 PM 07/16/2023    3:32 PM 03/28/2023    3:15 PM 12/26/2022    3:50 PM 09/25/2022    2:28 PM  Depression screen PHQ 2/9  Decreased Interest 2 0 1 1 0  Down, Depressed, Hopeless 1 1 2 1 1   PHQ - 2 Score 3 1 3 2 1   Altered sleeping 2 2 1 2 1   Tired, decreased energy 2 2 2 2 1   Change  in appetite 0 2 0 0 0  Feeling bad or failure about yourself  0 0 0 0 0  Trouble concentrating 0 0 0 0 0  Moving slowly or fidgety/restless 0 0 0 0 0  Suicidal thoughts 0 0 0 0 0  PHQ-9 Score 7 7 6 6 3   Difficult doing work/chores Not difficult at all Not difficult at all Not difficult at all Not difficult at all Not difficult at all       10/23/2023    3:50 PM 07/16/2023     3:33 PM 03/28/2023    3:15 PM 12/26/2022    3:50 PM  GAD 7 : Generalized Anxiety Score  Nervous, Anxious, on Edge 1 1 2 1   Control/stop worrying 1 2 3 1   Worry too much - different things 2 1 3 3   Trouble relaxing 1 0 2 2  Restless 1 0 2 1  Easily annoyed or irritable 1 2 3 1   Afraid - awful might happen 0 0 0 0  Total GAD 7 Score 7 6 15 9   Anxiety Difficulty Not difficult at all Not difficult at all Not difficult at all Not difficult at all   Relevant past medical, surgical, family and social history reviewed and updated as indicated. Interim medical history since our last visit reviewed. Allergies and medications reviewed and updated.  Review of Systems  Constitutional:  Negative for activity change, appetite change, diaphoresis, fatigue and fever.  Respiratory:  Negative for cough, chest tightness, shortness of breath and wheezing.   Cardiovascular:  Negative for chest pain, palpitations and leg swelling.  Gastrointestinal: Negative.   Endocrine: Negative for cold intolerance, heat intolerance, polydipsia, polyphagia and polyuria.  Neurological: Negative.   Psychiatric/Behavioral:  Negative for decreased concentration, self-injury, sleep disturbance and suicidal ideas. The patient is nervous/anxious.    Per HPI unless specifically indicated above     Objective:    BP 121/82   Pulse 80   Temp 97.9 F (36.6 C) (Oral)   Ht 5' 2 (1.575 m)   Wt 205 lb 12.8 oz (93.4 kg)   SpO2 98%   BMI 37.64 kg/m   Wt Readings from Last 3 Encounters:  10/23/23 205 lb 12.8 oz (93.4 kg)  07/16/23 211 lb 9.6 oz (96 kg)  05/30/23 219 lb (99.3 kg)    Physical Exam Vitals and nursing note reviewed.  Constitutional:      General: She is awake. She is not in acute distress.    Appearance: She is well-developed and well-groomed. She is obese. She is not ill-appearing or toxic-appearing.  HENT:     Head: Normocephalic.     Right Ear: Hearing normal.     Left Ear: Hearing normal.  Eyes:      General: Lids are normal.        Right eye: No discharge.        Left eye: No discharge.     Pupils: Pupils are equal, round, and reactive to light.     Funduscopic exam:    Right eye: No hemorrhage.        Left eye: No hemorrhage.  Neck:     Thyroid: No thyromegaly.     Vascular: No carotid bruit.  Cardiovascular:     Rate and Rhythm: Normal rate and regular rhythm.     Heart sounds: Murmur heard.     Systolic murmur is present with a grade of 3/6.     No gallop.  Pulmonary:  Effort: Pulmonary effort is normal. No accessory muscle usage or respiratory distress.     Breath sounds: Normal breath sounds.  Abdominal:     General: Bowel sounds are normal.     Palpations: Abdomen is soft.  Musculoskeletal:     Cervical back: Normal range of motion and neck supple.     Right lower leg: No edema.     Left lower leg: No edema.  Lymphadenopathy:     Head:     Right side of head: No submental, submandibular, tonsillar, preauricular or posterior auricular adenopathy.     Left side of head: No submental, submandibular, tonsillar, preauricular or posterior auricular adenopathy.     Cervical: No cervical adenopathy.  Skin:    General: Skin is warm and dry.  Neurological:     Mental Status: She is alert and oriented to person, place, and time.  Psychiatric:        Attention and Perception: Attention normal.        Mood and Affect: Mood normal. Affect is tearful.        Speech: Speech normal.        Behavior: Behavior normal. Behavior is cooperative.        Thought Content: Thought content normal.    Diabetic Foot Exam - Simple   Simple Foot Form Visual Inspection No deformities, no ulcerations, no other skin breakdown bilaterally: Yes Sensation Testing See comments: Yes Pulse Check Posterior Tibialis and Dorsalis pulse intact bilaterally: Yes Comments Sensation both feet, left 8/10 and right 9/10.  Main area of reduction is dorsal aspect of feet.     Results for orders  placed or performed in visit on 10/23/23  Bayer DCA Hb A1c Waived   Collection Time: 10/23/23  3:49 PM  Result Value Ref Range   HB A1C (BAYER DCA - WAIVED) 6.7 (H) 4.8 - 5.6 %      Assessment & Plan:   Problem List Items Addressed This Visit       Cardiovascular and Mediastinum   Hypertension associated with diabetes (HCC)   Chronic, stable. BP well below goal in office. Currently on Lisinopril  for kidney protection and BP, may benefit from switch to ARB in future due to her underlying asthma. Consider this next visit. Recommend she monitor BP at least a few mornings a week at home and document.  DASH diet at home.  Labs today: A1c and CMP.  Urine ALB 150 January 2025.  Return in 3 months.       Relevant Orders   Bayer DCA Hb A1c Waived (Completed)     Respiratory   Mild persistent asthma without complication   Chronic, ongoing. Has more coughing with Qvar . Will trial changing to LABA/ICS which may offer more benefit.  Discussed with patient and educated her on this. Start Advair HFA and continue Albuterol  as needed. If not covered will write for alternate. Continue modest weight loss.  Labs today: up to date.  She is aware to rinse mouth well after using Advair. Spirometry next visit.      Relevant Medications   fluticasone -salmeterol (ADVAIR HFA) 115-21 MCG/ACT inhaler     Endocrine   Type 2 diabetes mellitus with diabetic polyneuropathy, with long-term current use of insulin  (HCC)   Chronic, ongoing with A1c 6.7% today, trend down. Urine ALB 150 January 2025. Continue Gabapentin  and B12 for neuropathy.   - Continue Mounjaro  12.5 MG weekly (will continue to work with PharmD on this and adjust as needed) and continue  Farxiga  daily with goal in future to reduce to discontinue insulin , discussed with patient.  At this time continue Glipizide  10 MG BID and reduce Lantus  to 15 units, discussed with her that if lows present she is to reduce this by 3 units every 3 days if needed = if BS  in morning <130 consistently.   - Check BS TID and document for visits.   - Not up to date on eye. Up to date on foot exam - On ACE and Statin - Refuses PCV20 and Flu - Return in 3 months.      Relevant Orders   Bayer DCA Hb A1c Waived (Completed)   Type 2 diabetes mellitus with proteinuria (HCC) - Primary   Chronic, ongoing with A1c 6.7% today, trend down. Urine ALB 150 January 2025. Continue Gabapentin  and B12 for neuropathy.   - Continue Mounjaro  12.5 MG weekly (will continue to work with PharmD on this and adjust as needed) and continue Farxiga  daily with goal in future to reduce to discontinue insulin , discussed with patient.  At this time continue Glipizide  10 MG BID and reduce Lantus  to 15 units, discussed with her that if lows present she is to reduce this by 3 units every 3 days if needed = if BS in morning <130 consistently.   - Check BS TID and document for visits.   - Not up to date on eye. Up to date on foot exam - On ACE and Statin - Refuses PCV20 and Flu - Return in 3 months.      Relevant Orders   Bayer DCA Hb A1c Waived (Completed)   Hyperlipidemia associated with type 2 diabetes mellitus (HCC)   Chronic, ongoing.  Continue current medication regimen and adjust as needed.  Lipid panel today.      Relevant Orders   Bayer DCA Hb A1c Waived (Completed)   Comprehensive metabolic panel with GFR   Lipid Panel w/o Chol/HDL Ratio   Diabetes mellitus treated with injections of non-insulin  medication (HCC)   Refer to diabetes with proteinuria plan of care.      Relevant Orders   Bayer DCA Hb A1c Waived (Completed)     Other   Morbid obesity (HCC)   BMI 37.64 with T2DM, HLD, HTN.  Continues to lose weight on Mounjaro , over 15 lbs. Recommended eating smaller high protein, low fat meals more frequently and exercising 30 mins a day 5 times a week with a goal of 10-15lb weight loss in the next 3 months. Patient voiced their understanding and motivation to adhere to these  recommendations.       Heart murmur, systolic   Ongoing with history of work-up in past. She denies any symptoms. Will obtain echo if worsening or symptoms present.      Anxiety   Chronic, stable.  Was followed by [facility] in past, but PCP currently fills her medications.  Will return to them if any worsening mood.  Denies SI/HI.  Continue current medication regimen and adjust as needed.        Follow up plan: Return in about 3 months (around 01/23/2024) for T2DM, HTN/HLD, Depression, Asthma.

## 2023-10-23 NOTE — Assessment & Plan Note (Signed)
 Chronic, ongoing with A1c 6.7% today, trend down. Urine ALB 150 January 2025. Continue Gabapentin  and B12 for neuropathy.   - Continue Mounjaro  12.5 MG weekly (will continue to work with PharmD on this and adjust as needed) and continue Farxiga  daily with goal in future to reduce to discontinue insulin , discussed with patient.  At this time continue Glipizide  10 MG BID and reduce Lantus  to 15 units, discussed with her that if lows present she is to reduce this by 3 units every 3 days if needed = if BS in morning <130 consistently.   - Check BS TID and document for visits.   - Not up to date on eye. Up to date on foot exam - On ACE and Statin - Refuses PCV20 and Flu - Return in 3 months.

## 2023-10-23 NOTE — Assessment & Plan Note (Signed)
 Chronic, ongoing. Has more coughing with Qvar . Will trial changing to LABA/ICS which may offer more benefit.  Discussed with patient and educated her on this. Start Advair HFA and continue Albuterol  as needed. If not covered will write for alternate. Continue modest weight loss.  Labs today: up to date.  She is aware to rinse mouth well after using Advair. Spirometry next visit.

## 2023-10-23 NOTE — Assessment & Plan Note (Signed)
 Chronic, stable. BP well below goal in office. Currently on Lisinopril  for kidney protection and BP, may benefit from switch to ARB in future due to her underlying asthma. Consider this next visit. Recommend she monitor BP at least a few mornings a week at home and document.  DASH diet at home.  Labs today: A1c and CMP.  Urine ALB 150 January 2025.  Return in 3 months.

## 2023-10-23 NOTE — Assessment & Plan Note (Signed)
 Chronic, stable.  Was followed by [facility] in past, but PCP currently fills her medications.  Will return to them if any worsening mood.  Denies SI/HI.  Continue current medication regimen and adjust as needed.

## 2023-10-23 NOTE — Assessment & Plan Note (Signed)
 Refer to diabetes with proteinuria plan of care.

## 2023-10-23 NOTE — Assessment & Plan Note (Signed)
 Ongoing with history of work-up in past. She denies any symptoms. Will obtain echo if worsening or symptoms present.

## 2023-10-23 NOTE — Assessment & Plan Note (Signed)
 BMI 37.64 with T2DM, HLD, HTN.  Continues to lose weight on Mounjaro , over 15 lbs. Recommended eating smaller high protein, low fat meals more frequently and exercising 30 mins a day 5 times a week with a goal of 10-15lb weight loss in the next 3 months. Patient voiced their understanding and motivation to adhere to these recommendations.

## 2023-10-24 ENCOUNTER — Ambulatory Visit: Payer: Self-pay | Admitting: Nurse Practitioner

## 2023-10-24 LAB — LIPID PANEL W/O CHOL/HDL RATIO
Cholesterol, Total: 126 mg/dL (ref 100–199)
HDL: 45 mg/dL (ref >39)
LDL Chol Calc (NIH): 53 mg/dL (ref 0–99)
Triglycerides: 166 mg/dL — ABNORMAL HIGH (ref 0–149)
VLDL Cholesterol Cal: 28 mg/dL (ref 5–40)

## 2023-10-24 LAB — COMPREHENSIVE METABOLIC PANEL WITH GFR
ALT: 18 IU/L (ref 0–32)
AST: 16 IU/L (ref 0–40)
Albumin: 4.5 g/dL (ref 3.9–4.9)
Alkaline Phosphatase: 64 IU/L (ref 44–121)
BUN/Creatinine Ratio: 15 (ref 9–23)
BUN: 10 mg/dL (ref 6–24)
Bilirubin Total: 0.3 mg/dL (ref 0.0–1.2)
CO2: 22 mmol/L (ref 20–29)
Calcium: 9.4 mg/dL (ref 8.7–10.2)
Chloride: 101 mmol/L (ref 96–106)
Creatinine, Ser: 0.66 mg/dL (ref 0.57–1.00)
Globulin, Total: 2.6 g/dL (ref 1.5–4.5)
Glucose: 159 mg/dL — ABNORMAL HIGH (ref 70–99)
Potassium: 4.3 mmol/L (ref 3.5–5.2)
Sodium: 138 mmol/L (ref 134–144)
Total Protein: 7.1 g/dL (ref 6.0–8.5)
eGFR: 109 mL/min/1.73 (ref >59)

## 2023-10-24 NOTE — Progress Notes (Signed)
 Contacted via MyChart  Good morning Michelle Warner, your labs have returned and overall remain stable. Kidney function, creatinine and eGFR, remains normal, as is liver function, AST and ALT. Triglycerides mildly elevated, continue medications and focus on healthy diet changes. Any questions? Keep being amazing!!  Thank you for allowing me to participate in your care.  I appreciate you. Kindest regards, Michelle Warner

## 2023-11-06 ENCOUNTER — Other Ambulatory Visit: Payer: Self-pay | Admitting: *Deleted

## 2023-11-06 ENCOUNTER — Telehealth: Payer: MEDICAID | Admitting: *Deleted

## 2023-11-07 NOTE — Patient Instructions (Signed)
 Visit Information  Thank you for taking time to visit with me today.   Tailored Plan Medicaid On September 18, 2022 some people on [place] Medicaid will move to a new kind of Medicaid health plan called a Tailored Plan. Tailored Plans cover your doctor visits, prescription drugs, and health care services.    If your [place] Medicaid will move to a Tailored Plan, you should have gotten a letter and welcome packet. If you're not sure, call your [place] Medicaid Enrollment Broker at [phone] and ask.  Check out these free materials, in [place] and [place], to learn more about your Tailored Plan: [email or web address]-[email or web address]/[email or web address]  Tailored Care Management Services  TCM services are available to you now. If you are a Tailored Plan member or will be and want information about Tailored Care Management Services including rides to appointments and community and home services, call the Care Management provider for your county of residence:    [facility] [place], [place])  Member Services: [phone] Behavioral Health Crisis Line: [phone], [address], [place], [place], [place])  Member Services: [phone] Behavioral Health Crisis Line: 720-107-9559     Please call the Suicide and Crisis Lifeline: 988 call the [place]  National Suicide Prevention Lifeline: (469) 064-8198 or TTY: (551) 352-5220 TTY (818)757-8278) to talk to a trained counselor call 1-800-273-TALK (toll free, 24 hour hotline) if you are experiencing a Mental Health or Behavioral Health Crisis or need someone to talk to.  Patient verbalizes understanding of instructions and care plan provided today and agrees to view in MyChart. Active MyChart status and patient understanding of how to access instructions and care plan via MyChart confirmed with patient.     Lenus Trauger, LCSW [place]  [facility], [facility] Licensed Clinical Social Worker  Direct Dial: [phone]

## 2023-11-07 NOTE — Patient Outreach (Signed)
 Complex Care Management   Visit Note  11/07/2023  Name:  Michelle Warner MRN: [id number] DOB: 1975-09-22  Situation: Referral received for Complex Care Management related to financial and emotional stress  Patient stated that her stress is triggered by her family member's financial situation, however is able to maintain her household bils. I obtained verbal consent from Patient.  Visit completed with Patient  on the phone on 12/07/23.  Background:   Past Medical History:  Diagnosis Date   Anxiety    Appendicitis    Depression    Diabetes mellitus without complication (HCC)    Heart murmur    Hypercholesterolemia    Hypertension     Assessment: Patient Reported Symptoms:  Cognitive Cognitive Status: Alert and oriented to person, place, and time, Normal speech and language skills Cognitive/Intellectual Conditions Management [RPT]: None reported or documented in medical history or problem list   Health Maintenance Behaviors: Annual physical exam, Stress management Healing Pattern: Average Health Facilitated by: Healthy diet, Rest, Stress management  Neurological Neurological Review of Symptoms: No symptoms reported    HEENT HEENT Symptoms Reported: No symptoms reported      Cardiovascular Cardiovascular Symptoms Reported: No symptoms reported Does patient have uncontrolled Hypertension?: No Cardiovascular Management Strategies: Medication therapy  Respiratory Respiratory Symptoms Reported: No symptoms reported    Endocrine Endocrine Symptoms Reported: Headaches Is patient diabetic?: Yes Is patient checking blood sugars at home?: Yes List most recent blood sugar readings, include date and time of day: 105 6:48am 11/06/22    Gastrointestinal Gastrointestinal Symptoms Reported: No symptoms reported      Genitourinary Genitourinary Symptoms Reported: No symptoms reported    Integumentary Integumentary Symptoms Reported: No symptoms reported    Musculoskeletal Musculoskelatal  Symptoms Reviewed: No symptoms reported        Psychosocial Additional Psychological Details: stress and anxiety realted to daughter hurting  her ankle and unable to work(experiencing financial strain) cousin terminally ill and on Hospice care Behavioral Management Strategies: Coping strategies, Medication therapy Major Change/Loss/Stressor/Fears (CP): Resources Behaviors When Feeling Stressed/Fearful: cry, scroll on phone, medication, rest Techniques to Cope with Loss/Stress/Change: Diversional activities, Medication Quality of Family Relationships: helpful, involved, supportive Do you feel physically threatened by others?: No    11/07/2023    PHQ2-9 Depression Screening   Little interest or pleasure in doing things Not at all  Feeling down, depressed, or hopeless Not at all  PHQ-2 - Total Score 0  Trouble falling or staying asleep, or sleeping too much    Feeling tired or having little energy    Poor appetite or overeating     Feeling bad about yourself - or that you are a failure or have let yourself or your family down    Trouble concentrating on things, such as reading the newspaper or watching television    Moving or speaking so slowly that other people could have noticed.  Or the opposite - being so fidgety or restless that you have been moving around a lot more than usual    Thoughts that you would be better off dead, or hurting yourself in some way    PHQ2-9 Total Score    If you checked off any problems, how difficult have these problems made it for you to do your work, take care of things at home, or get along with other people    Depression Interventions/Treatment      There were no vitals filed for this visit.  Medications Reviewed Today     Reviewed  by Ermalinda Lenn HERO, LCSW (Social Worker) on 11/06/23 at 1210  Med List Status: <None>   Medication Order Taking? Sig Documenting Provider Last Dose Status Informant  acetaminophen  (TYLENOL ) 500 MG tablet [id number] Yes  Take 1,000 mg by mouth at bedtime. For back, leg and knee pain [provider]  Active Self  albuterol  (PROVENTIL ) (2.5 MG/3ML) 0.083% nebulizer solution [id number] Yes  [provider]  Active   albuterol  (VENTOLIN  HFA) 108 (90 Base) MCG/ACT inhaler [id number] Yes Inhale 1-2 puffs into the lungs every 6 (six) hours as needed for wheezing or shortness of breath. [name], Jolene T, NP  Active   benzonatate  (TESSALON ) 100 MG capsule [id number] Yes Take 1 capsule (100 mg total) by mouth 2 (two) times daily as needed for cough. [name], Jolene T, NP  Active   Blood Glucose Monitoring Suppl (FIFTY50 GLUCOSE METER 2.0) w/Device KIT [id number] Yes Disp. blood glucose meter kit preferred by patient's insurance. Check blood sugars as directed by provider. Dx: Diabetes, E11.9 [name], Jolene T, NP  Active   busPIRone  (BUSPAR ) 5 MG tablet [id number] Yes Take 1 tablet (5 mg total) by mouth 2 (two) times daily. [name], Jolene T, NP  Active   citalopram  (CELEXA ) 20 MG tablet [id number] Yes Take 1 tablet (20 mg total) by mouth daily. Valerio Moris T, NP  Active   Continuous Glucose Sensor (FREESTYLE LIBRE 3 SENSOR) [place] [id number] Yes Is on insulin , to check blood sugars regularly 4-5 days daily. [name], Jolene T, NP  Active   cyanocobalamin (VITAMIN B12) 1000 MCG tablet [id number]  Take 1 tablet (1,000 mcg total) by mouth daily.  Patient not taking: Reported on 11/06/2023   [name], Jolene T, NP  Active   FARXIGA  10 MG TABS tablet [id number] Yes TAKE 1 TABLET BY MOUTH ONCE DAILY BEFORE BREAKFAST [name], Jolene T, NP  Active   fluticasone  (FLONASE ) 50 MCG/ACT nasal spray [id number] Yes Place 2 sprays into both nostrils daily. Chaplin, Don C, MD  Active   fluticasone -salmeterol (ADVAIR HFA) 115-21 MCG/ACT inhaler [id number] Yes Inhale 2 puffs into the lungs 2 (two) times daily. [name], Jolene T, NP  Active   gabapentin  (NEURONTIN ) 300 MG capsule [id number] Yes TAKE 1 CAPSULE BY MOUTH THREE TIMES DAILY  [name], Jolene T, NP  Active   gentamicin  ointment (GARAMYCIN ) 0.1 % [id number] Yes Apply 1 Application topically 3 (three) times daily. To wound on back of left heel. [name], Jolene T, NP  Active   glipiZIDE  (GLUCOTROL ) 10 MG tablet [id number] Yes TAKE 1 TABLET BY MOUTH TWICE DAILY BEFORE A MEAL [name], Jolene T, NP  Active   glucose blood ([employer] BLOOD GLUCOSE TEST) test strip [id number] Yes Disp test strips preferred by insurance plan. Testing twice daily. Dx:E11.9,Z79.4   Active   Insulin  Pen Needle (BD PEN NEEDLE NANO 2ND GEN) 32G X 4 MM MISC [id number] Yes USE 1 PEN NEEDLE ONCE DAILY [name], Jolene T, NP  Active   Lancets (ONETOUCH ULTRASOFT) lancets [id number] Yes Use as instructed, to check blood sugars 3-4 times daily. [name], Jolene T, NP  Active   LANTUS  SOLOSTAR 100 UNIT/ML Solostar Pen [id number] Yes Inject 30 Units into the skin at bedtime. [provider]  Active   lidocaine  (LIDODERM ) 5 % [id number] Yes Place 1 patch onto the skin daily. Remove & Discard patch within 12 hours or as directed by MD [name], Jolene T, NP  Active   lisinopril  (ZESTRIL ) 20 MG tablet [id number] Yes Take 1 tablet by mouth once daily [name], Jolene T, NP  Active   loratadine  (EQ ALL DAY ALLERGY RELIEF) 10 MG tablet [id number] Yes Take 1 tablet (10 mg total) by mouth daily. [name], Jolene T, NP  Active   omeprazole  (PRILOSEC) 20 MG capsule [id number] Yes Take 1 capsule by mouth twice daily [name], Jolene T, NP  Active   rosuvastatin  (CRESTOR ) 20 MG tablet [id number] Yes Take 1 tablet by mouth once daily [name], Jolene T, NP  Active   tirzepatide  (MOUNJARO ) 12.5 MG/0.5ML Pen [id number] Yes Inject 12.5 mg into the skin once a week. [name], Jolene T, NP  Active            Med Note (GENTRY, CHERYL A   Wed Jul 18, 2023  2:13 PM) Finishing 10mg  pens on hand before increasing dose  traZODone  (DESYREL ) 100 MG tablet [id number] Yes Take 1.5 tablets (150 mg total) by mouth at bedtime as needed for sleep. [name],  Jolene T, NP  Active   triamcinolone  cream (KENALOG ) 0.1 % [id number] Yes Apply 1 Application topically 2 (two) times daily. [name], Jolene T, NP  Active   Vitamin D , Ergocalciferol , (DRISDOL ) 1.25 MG (50000 UNIT) CAPS capsule [id number] Yes Take 1 capsule (50,000 Units total) by mouth once a week. Valerio Melanie DASEN, NP  Active             Recommendation:   PCP Follow-up 01/25/24  Follow Up Plan:   Telephone follow up appointment date/time:  11/08/23 3pm with BSW  Shajuan Musso, LCSW [place]  [facility] Licensed Clinical Social Worker  Direct Dial: [phone]

## 2023-11-08 ENCOUNTER — Other Ambulatory Visit: Payer: Self-pay | Admitting: Nurse Practitioner

## 2023-11-08 ENCOUNTER — Other Ambulatory Visit: Payer: MEDICAID

## 2023-11-08 DIAGNOSIS — Z794 Long term (current) use of insulin: Secondary | ICD-10-CM

## 2023-11-08 NOTE — Patient Outreach (Signed)
 Complex Care Management   Visit Note  11/08/2023  Name:  Michelle Warner MRN: [id number] DOB: [birth year]  Situation: Referral received for Complex Care Management related to SDOH Barriers:  Food insecurity Dental and eye I obtained verbal consent from Patient.  Visit completed with Patient  on the phone  Background:   Past Medical History:  Diagnosis Date   Anxiety    Appendicitis    Depression    Diabetes mellitus without complication (HCC)    Heart murmur    Hypercholesterolemia    Hypertension     Assessment:  SW completed a telephone outreach with patient, she states they are needing assistance with food. Her husband has not had a chance to apply for foodstamps yet. SW and patient agreed for resources to be emailed to address on file.  SW contacted [employer] to get patient connected with care management, SW spoke with Parker and she informed SW patient case Production designer, theatre/television/film is [employer]. SW was transferred to case managers voicemail and left a message.  SDOH Interventions    Flowsheet Row Patient Outreach Telephone from 11/08/2023 in [place] POPULATION HEALTH DEPARTMENT Patient Outreach Telephone from 11/06/2023 in [place] POPULATION HEALTH DEPARTMENT Office Visit from 10/23/2023 in [facility] Office Visit from 07/16/2023 in [facility] Office Visit from 03/28/2023 in [facility] Office Visit from 12/26/2022 in [facility]  SDOH Interventions        Food Insecurity Interventions Community Resources Provided Intervention Not Indicated -- -- -- --  Housing Interventions -- Intervention Not Indicated -- -- -- --  Transportation Interventions -- Intervention Not Indicated -- -- -- --  Utilities Interventions -- Intervention Not Indicated -- -- -- --  Depression Interventions/Treatment  -- -- Medication, Currently on Treatment Medication, Currently on Treatment Medication, Currently on  Treatment Medication, Currently on Treatment    Recommendation:   No recommendations at this time  Follow Up Plan:   Telephone follow-up on 11/22/23 at 11am  Thersia Hoar, BSW, MHA [place]  [facility] Social Worker, Population Health [phone]

## 2023-11-08 NOTE — Patient Instructions (Signed)
 Visit Information  Thank you for taking time to visit with me today. Please don't hesitate to contact me if I can be of assistance to you before our next scheduled appointment.  Your next care management appointment is by telephone on 11/22/23 at 11am    Please call the care guide team at [phone] if you need to cancel, schedule, or reschedule an appointment.   Please call the Suicide and Crisis Lifeline: 988 call the [place]  National Suicide Prevention Lifeline: 403-489-4944 or TTY: 432-079-0359 TTY 872-263-5555) to talk to a trained counselor call 1-800-273-TALK (toll free, 24 hour hotline) call 911 if you are experiencing a Mental Health or Behavioral Health Crisis or need someone to talk to.  Thersia Hoar, HEDWIG, MHA   [facility] Social Worker, Population Health [phone]

## 2023-11-09 NOTE — Telephone Encounter (Signed)
 Requested Prescriptions  Pending Prescriptions Disp Refills   glipiZIDE  (GLUCOTROL ) 10 MG tablet [Pharmacy Med Name: glipiZIDE  10 MG Oral Tablet] 180 tablet 1    Sig: TAKE 1 TABLET BY MOUTH TWICE DAILY BEFORE A MEAL     Endocrinology:  Diabetes - Sulfonylureas Passed - 11/09/2023  2:22 PM      Passed - HBA1C is between 0 and 7.9 and within 180 days    HB A1C (BAYER DCA - WAIVED)  Date Value Ref Range Status  10/23/2023 6.7 (H) 4.8 - 5.6 % Final    Comment:             Prediabetes: 5.7 - 6.4          Diabetes: >6.4          Glycemic control for adults with diabetes: <7.0          Passed - Cr in normal range and within 360 days    Creatinine  Date Value Ref Range Status  08/24/2011 0.71 0.60 - 1.30 mg/dL Final   Creatinine, Ser  Date Value Ref Range Status  10/23/2023 0.66 0.57 - 1.00 mg/dL Final         Passed - Valid encounter within last 6 months    Recent Outpatient Visits           2 weeks ago Type 2 diabetes mellitus with proteinuria (HCC)   [facility] [place], [place] T, NP   3 months ago Type 2 diabetes mellitus with proteinuria (HCC)   [place] [facility] [place], Melanie DASEN, NP

## 2023-11-14 ENCOUNTER — Telehealth: Payer: Self-pay | Admitting: Nurse Practitioner

## 2023-11-14 NOTE — Telephone Encounter (Signed)
 Would this be something we can do for the patient?

## 2023-11-14 NOTE — Telephone Encounter (Signed)
 Copied from CRM [id number]. Topic: General - Other >> Nov 14, 2023  1:11 PM Antwanette L wrote: Reason for CRM: The patient is requesting a letter from provider stating she is unable to work in order to get approved for [employer]. Please follow w/ the patient at [phone]

## 2023-11-20 NOTE — Telephone Encounter (Signed)
 Called and notified patient of Jolene's message.

## 2023-11-22 ENCOUNTER — Telehealth: Payer: MEDICAID

## 2023-11-27 ENCOUNTER — Other Ambulatory Visit: Payer: MEDICAID

## 2023-11-27 DIAGNOSIS — E1142 Type 2 diabetes mellitus with diabetic polyneuropathy: Secondary | ICD-10-CM

## 2023-11-27 MED ORDER — FREESTYLE PRECISION NEO TEST VI STRP: ORAL_STRIP | 12 refills | Status: AC

## 2023-11-27 NOTE — Progress Notes (Signed)
   11/27/2023  Patient ID: Michelle Warner, female   DOB: [birth year], 48 y.o.   MRN: [id number]  Subjective/Objective Telephone visit to follow-up on management of diabetes   Diabetes Management Plan -Current medications:  Mounjaro  10mg  weekly, Semglee  20units daily, glipizide  10mg  BID, Farxiga  10mg  daily -Patient has tried metformin  several times in the past and cannot tolerate -Patient checked in refrigerator and is actually currently using Mounjaro  10mg  weekly and has 2 pens remaining -She continues to have occasional stomach cramping and states her stomach will make funny noises.  Taking omeprazole  20mg  BID for heartburn but nothing else to help with any stomach upset.  She also states she has occasional fecal incontinence but states this is not new with Mounjaro  and has been going on for a long time.  States she had an endoscopy over 10 years ago and wonders if she needs another. -A1c 6.7% recently -Able to obtain and start using Libre 3 sensors and is using mobile app on phone as reader -She does have the [place] 3 reader and would like Libre test strips to use with this as back up to sensors -Recent FBG readings the past two mornings have been 64 and 78 -Patient was advised to decrease Lantus  to 15 units at last PCP visit, but she has been administering 20 units  Asthma -Current medications: albuterol  rescue inhaler PRN, albuterol  nebulizer solution PRN -PCP recently ordered generic Advair HFA 115/21, but patient states she has not received this from the pharmacy   Assessment/Plan   Diabetes Management Plan -Continue Farxiga  10mg  daily, glipizide  10mg  BID, and Mounjaro  10mg  weekly -Decrease Lantus  to 15 units at bedtime to prevent hypoglycemia -I recommend keeping Mounjaro  at 10mg  weekly for now based on GI concerns; patient could possibly benefit from a referral to GI knowing that fecal incontinence and stomach upset were present prior to initiating Mounjaro  -Patient would like and order  for adult diapers or pads sent to DME supplier to see if these would be covered by insurance -Advised patient to try PRN tums or Pepto chewables to help with stomach upset; I also recommended to increase fiber intake with fruits and vegetables and to avoid greasy and fried foods -Sending Freestyle Precision Neo test strips to pharmacy for patient to use with [facility] 3 reader as back up to sensors -Continue Libre 3 for CGM -Sees PCP again 11/7  Asthma -Contacted [facility] and order for generic Advair HFA 115/21 is going through on insurance for $4 copay and will be ready for pick up later today  Follow-up:  2 weeks to follow-up on home BG and make sure patient got new inhaler and test strips   Michelle Warner, PharmD, DPLA

## 2023-12-06 ENCOUNTER — Other Ambulatory Visit: Payer: MEDICAID

## 2023-12-06 NOTE — Patient Outreach (Signed)
 Complex Care Management   Visit Note  12/06/2023  Name:  Michelle Warner MRN: [id number] DOB: 06-Apr-1975  Situation: Referral received for Complex Care Management related to SDOH Barriers:  Food insecurity I obtained verbal consent from Patient.  Visit completed with Patient  on the phone  Background:   Past Medical History:  Diagnosis Date   Anxiety    Appendicitis    Depression    Diabetes mellitus without complication (HCC)    Heart murmur    Hypercholesterolemia    Hypertension     Assessment: SW completed a telephone outreach with patient, she states she did receive the resources SW sent via email. Patient states she has not received a call from her tailored plan. SW made another attempt and left a voicemail. Patient states she will give them a call. No other resources are needed at this time.  SDOH Interventions    Flowsheet Row Patient Outreach Telephone from 11/08/2023 in [place] POPULATION HEALTH DEPARTMENT Patient Outreach Telephone from 11/06/2023 in [place] POPULATION HEALTH DEPARTMENT Office Visit from 10/23/2023 in [facility] Office Visit from 07/16/2023 in [facility] Office Visit from 03/28/2023 in [facility] Office Visit from 12/26/2022 in [facility]  SDOH Interventions        Food Insecurity Interventions Community Resources Provided Intervention Not Indicated -- -- -- --  Housing Interventions -- Intervention Not Indicated -- -- -- --  Transportation Interventions -- Intervention Not Indicated -- -- -- --  Utilities Interventions -- Intervention Not Indicated -- -- -- --  Depression Interventions/Treatment  -- -- Medication, Currently on Treatment Medication, Currently on Treatment Medication, Currently on Treatment Medication, Currently on Treatment      Recommendation:   No recommendations at this time  Follow Up Plan:   Patient has met all care management  goals. Care Management case will be closed. Patient has been provided contact information should new needs arise.  Thersia Hoar, HEDWIG, MHA   [facility] Social Worker, Population Health [phone]

## 2023-12-06 NOTE — Patient Instructions (Signed)
 Tailored Plan Medicaid On September 18, 2022 some people on [place] Medicaid will move to a new kind of Medicaid health plan called a Tailored Plan. Tailored Plans cover your doctor visits, prescription drugs, and health care services.    If your [place] Medicaid will move to a Tailored Plan, you should have gotten a letter and welcome packet. If you're not sure, call your [place] Medicaid Enrollment Broker at [phone] and ask.  Check out these free materials, in [place] and [place], to learn more about your Tailored Plan: [email or web address]/Tailored-Plans/Toolkit  Tailored Care Management Services  TCM services are available to you now. If you are a Tailored Plan member or will be and want information about Tailored Care Management Services including rides to appointments and community and home services, call the Care Management provider for your county of residence:    [facility] ([place], [place])  Member Services: [phone] Behavioral Health Crisis Line: [phone], [place], [place], [place], [place])  Member Services: [phone] Behavioral Health Crisis Line: [phone]  [facility] [place]) Member Services: [phone] Behavioral Health Crisis Line: [phone]

## 2023-12-10 NOTE — Progress Notes (Unsigned)
   12/11/2023  Patient ID: Michelle Warner, female   DOB: [birth year], 48 y.o.   MRN: [id number]  Subjective/Objective Telephone visit to follow-up on management of diabetes   Diabetes Management Plan -Current medications:  Mounjaro  10mg  weekly, Lantus  15 units daily, glipizide  10mg  BID, Farxiga  10mg  daily -Patient has tried metformin  several times in the past and cannot tolerate -She continues to have occasional stomach cramping and states her stomach will make funny noises.  Taking omeprazole  20mg  BID for heartburn but nothing else to help with any stomach upset.  She also states she has occasional fecal incontinence but states this is not new with Mounjaro  and has been going on for a long time.  States she had an endoscopy over 10 years ago and wonders if she needs another. -A1c 6.7% recently -Able to obtain and start using Libre 3 sensors and is using mobile app on phone as reader -She has not yet picked up Freestyle Neo test strips to have as back up for fingersticks but order was sent to pharmacy after our last visit -States recent FBG's have been 92, 107, 78, 96, and 56- this is after decreasing Lantus  to 15 units from 20 units at bedtime recently  -Patient does state she has only been taking glipizide  once daily and this is in the evening around 8pm (after supper) versus BID as prescribed -ACEi/ARB on board for cardiorenal protection:  lisinopril  20mg  daily -Statin on board for ASCVD risk reduction:  rosuvastatin  20mg  daily   Asthma -Current medications: albuterol  rescue inhaler PRN, albuterol  nebulizer solution PRN -PCP recently ordered generic Advair HFA 115/21, and [facility] was filling this after our last visit; but she has not yet been able to pick up   Assessment/Plan   Diabetes Management Plan -Controlled based on most recent A1c -Cardiorenal protection is maxamized -LDL is controlled -BP is controlled -Continue Farxiga  10mg  daily and Mounjaro  10mg  weekly -Start taking glipizide   10mg  daily with supper; move Lantus  15 units to the mornings and do not use if FBG <100- goal is to get improved post-prandial readings and prevent nocturnal or fasting hypoglycemia -I recommend keeping Mounjaro  at 10mg  weekly for now based on GI concerns; patient could possibly benefit from a referral to GI knowing that fecal incontinence and stomach upset were present prior to initiating Mounjaro  -Plans to pick up Freestyle Neo test strips this week -Continue Libre 3 for CGM -Sees PCP again 11/7 and will be due for A1c  Asthma -Advised patient to initiate therapy and make sure to use daily as directed  Follow-up:  2 weeks to follow-up on home BG and make sure patient got new inhaler and test strips   [place] Michelle Warner, PharmD, DPLA

## 2023-12-11 ENCOUNTER — Other Ambulatory Visit: Payer: MEDICAID

## 2023-12-11 DIAGNOSIS — E1159 Type 2 diabetes mellitus with other circulatory complications: Secondary | ICD-10-CM

## 2023-12-11 DIAGNOSIS — E1169 Type 2 diabetes mellitus with other specified complication: Secondary | ICD-10-CM

## 2023-12-11 DIAGNOSIS — E1142 Type 2 diabetes mellitus with diabetic polyneuropathy: Secondary | ICD-10-CM

## 2023-12-11 DIAGNOSIS — J4530 Mild persistent asthma, uncomplicated: Secondary | ICD-10-CM

## 2023-12-24 NOTE — Progress Notes (Unsigned)
   12/11/2023  Patient ID: Michelle Warner, female   DOB: [birth year], 48 y.o.   MRN: [id number]  Subjective/Objective Telephone visit to follow-up on management of diabetes   Diabetes Management Plan -Current medications:  Mounjaro  10mg  weekly, Lantus  15 units daily, glipizide  10mg  BID, Farxiga  10mg  daily -Patient has tried metformin  several times in the past and cannot tolerate -She continues to have occasional stomach cramping and states her stomach will make funny noises.  Taking omeprazole  20mg  BID for heartburn but nothing else to help with any stomach upset.  She also states she has occasional fecal incontinence but states this is not new with Mounjaro  and has been going on for a long time.  States she had an endoscopy over 10 years ago and wonders if she needs another. -A1c 6.7% recently -Able to obtain and start using Libre 3 sensors and is using mobile app on phone as reader -She has not yet picked up Freestyle Neo test strips to have as back up for fingersticks but order was sent to pharmacy after our last visit -States recent FBG's have been 92, 107, 78, 96, and 56- this is after decreasing Lantus  to 15 units from 20 units at bedtime recently  -Patient does state she has only been taking glipizide  once daily and this is in the evening around 8pm (after supper) versus BID as prescribed -ACEi/ARB on board for cardiorenal protection:  lisinopril  20mg  daily -Statin on board for ASCVD risk reduction:  rosuvastatin  20mg  daily   Asthma -Current medications: albuterol  rescue inhaler PRN, albuterol  nebulizer solution PRN -PCP recently ordered generic Advair HFA 115/21, and [facility] was filling this after our last visit; but she has not yet been able to pick up   Assessment/Plan   Diabetes Management Plan -Controlled based on most recent A1c -Cardiorenal protection is maxamized -LDL is controlled -BP is controlled -Continue Farxiga  10mg  daily and Mounjaro  10mg  weekly -Start taking glipizide   10mg  daily with supper; move Lantus  15 units to the mornings and do not use if FBG <100- goal is to get improved post-prandial readings and prevent nocturnal or fasting hypoglycemia -I recommend keeping Mounjaro  at 10mg  weekly for now based on GI concerns; patient could possibly benefit from a referral to GI knowing that fecal incontinence and stomach upset were present prior to initiating Mounjaro  -Plans to pick up Freestyle Neo test strips this week -Continue Libre 3 for CGM -Sees PCP again 11/7 and will be due for A1c  Asthma -Advised patient to initiate therapy and make sure to use daily as directed  Follow-up:  2 weeks to follow-up on home BG and make sure patient got new inhaler and test strips   Michelle Warner, PharmD, DPLA

## 2023-12-25 ENCOUNTER — Other Ambulatory Visit: Payer: MEDICAID

## 2024-01-01 ENCOUNTER — Other Ambulatory Visit: Payer: Self-pay

## 2024-01-01 DIAGNOSIS — E1142 Type 2 diabetes mellitus with diabetic polyneuropathy: Secondary | ICD-10-CM

## 2024-01-01 NOTE — Progress Notes (Signed)
   01/01/2024  Patient ID: Michelle Warner, female   DOB: [birth year], 48 y.o.   MRN: [id number]  Subjective/Objective Telephone visit to follow-up on management of diabetes   Diabetes Management Plan -Current medications:  Mounjaro  10mg  weekly, Lantus  15 units daily, glipizide  10mg  daily, Farxiga  10mg  daily -Patient has tried metformin  several times in the past and cannot tolerate -She continues to have occasional stomach cramping and states her stomach will make funny noises.  Taking omeprazole  20mg  BID for heartburn but nothing else to help with any stomach upset.  She also states she has occasional fecal incontinence but states this is not new with Mounjaro  and has been going on for a long time.  States she had an endoscopy over 10 years ago and wonders if she needs another. -A1c 6.7% recently -Using Libre 3+ for CGM -She has not yet picked up Freestyle Neo test strips to have as back up for fingersticks but order was sent to pharmacy after our last visit -Recently started taking Lantus  in the morning versus at bedtime to see if this would help prevent nocturnal hypoglycemia.  She still endorses some FBG readings in the upper 60's, as well as some overnight lows.  Patient does not sleep on side where [name] is placed, so I do not believe these are false lows. -Patient only takes glipizide  10mg  daily verus BID, as prescribed.  She was taking this around bedtime, but recently started taking with supper to help prevent nocturnal hypoglycemia. -FBG remains <100, but BG will increase to around 200 in the late afternoon/early evening per patient -ACEi/ARB on board for cardiorenal protection:  lisinopril  20mg  daily -Statin on board for ASCVD risk reduction:  rosuvastatin  20mg  daily   Asthma -Current medications: albuterol  rescue inhaler PRN, albuterol  nebulizer solution PRN -PCP recently ordered generic Advair HFA 115/21, and [facility] was filling this after our last visit; but she has not yet been able  to pick up   Assessment/Plan   Diabetes Management Plan -Controlled based on most recent A1c -Cardiorenal protection is maxamized -LDL is controlled -BP is controlled -Continue Farxiga  10mg  daily and Mounjaro  10mg  weekly -Start taking glipizide  10mg  daily with lunch or afternoon snack; continue Lantus  15 units to the mornings and do not use if FBG <100- goal is to get improved post-prandial readings and prevent nocturnal or fasting hypoglycemia -I recommend keeping Mounjaro  at 10mg  weekly for now based on GI concerns; patient could possibly benefit from a referral to GI knowing that fecal incontinence and stomach upset were present prior to initiating Mounjaro  -Plans to pick up Freestyle Neo test strips this week -Continue Libre 3 for CGM -Sees PCP again 11/7 and will be due for A1c  Asthma -Advised patient to initiate therapy and make sure to use daily as directed  Follow-up:  3 weeks to follow-up on home BG and make sure patient got new inhaler and test strips   [name], PharmD, DPLA

## 2024-01-13 ENCOUNTER — Other Ambulatory Visit: Payer: Self-pay | Admitting: Nurse Practitioner

## 2024-01-15 NOTE — Telephone Encounter (Signed)
 Requested medication (s) are due for refill today:   Requested medication (s) are on the active medication list: Yes  Last refill:  11/18/23  Future visit scheduled: Yes  Notes to clinic:  historical provider    Requested Prescriptions  Pending Prescriptions Disp Refills   MOUNJARO  10 MG/0.5ML Pen [Pharmacy Med Name: Mounjaro  10 MG/0.5ML Subcutaneous Solution Pen-injector] 4 mL 0    Sig: INJECT 10 MG UNDER THE SKIN ONCE WEEKLY. START AFTER HAVING COMPLETED 5 MG DOSING     Off-Protocol Failed - 01/15/2024 12:27 PM      Failed - Medication not assigned to a protocol, review manually.      Passed - Valid encounter within last 12 months    Recent Outpatient Visits           2 months ago Type 2 diabetes mellitus with proteinuria (HCC)   [place] [facility] [place], Weston T, NP   6 months ago Type 2 diabetes mellitus with proteinuria [facility])   [place] [facility] [place], Melanie DASEN, NP

## 2024-01-19 NOTE — Patient Instructions (Incomplete)

## 2024-01-19 NOTE — Progress Notes (Unsigned)
   01/01/2024  Patient ID: Michelle Warner, female   DOB: [birth year], 48 y.o.   MRN: [id number]  Subjective/Objective Telephone visit to follow-up on management of diabetes   Diabetes Management Plan -Current medications:  Mounjaro  10mg  weekly, Lantus  15 units daily, glipizide  10mg  daily, Farxiga  10mg  daily -Patient has tried metformin  several times in the past and cannot tolerate -She continues to have occasional stomach cramping and states her stomach will make funny noises.  Taking omeprazole  20mg  BID for heartburn but nothing else to help with any stomach upset.  She also states she has occasional fecal incontinence but states this is not new with Mounjaro  and has been going on for a long time.  States she had an endoscopy over 10 years ago and wonders if she needs another. -A1c 6.7% recently -Using Libre 3+ for CGM -She has not yet picked up Freestyle Neo test strips to have as back up for fingersticks but order was sent to pharmacy after our last visit -Recently started taking Lantus  in the morning versus at bedtime to see if this would help prevent nocturnal hypoglycemia.  She still endorses some FBG readings in the upper 60's, as well as some overnight lows.  Patient does not sleep on side where Michelle Warner is placed, so I do not believe these are false lows. -Patient only takes glipizide  10mg  daily verus BID, as prescribed.  She was taking this around bedtime, but recently started taking with supper to help prevent nocturnal hypoglycemia. -FBG remains <100, but BG will increase to around 200 in the late afternoon/early evening per patient -ACEi/ARB on board for cardiorenal protection:  lisinopril  20mg  daily -Statin on board for ASCVD risk reduction:  rosuvastatin  20mg  daily   Asthma -Current medications: albuterol  rescue inhaler PRN, albuterol  nebulizer solution PRN -PCP recently ordered generic Advair HFA 115/21, and [facility] was filling this after our last visit; but she has not yet been able  to pick up   Assessment/Plan   Diabetes Management Plan -Controlled based on most recent A1c -Cardiorenal protection is maxamized -LDL is controlled -BP is controlled -Continue Farxiga  10mg  daily and Mounjaro  10mg  weekly -Start taking glipizide  10mg  daily with lunch or afternoon snack; continue Lantus  15 units to the mornings and do not use if FBG <100- goal is to get improved post-prandial readings and prevent nocturnal or fasting hypoglycemia -I recommend keeping Mounjaro  at 10mg  weekly for now based on GI concerns; patient could possibly benefit from a referral to GI knowing that fecal incontinence and stomach upset were present prior to initiating Mounjaro  -Plans to pick up Freestyle Neo test strips this week -Continue Libre 3 for CGM -Sees PCP again 11/7 and will be due for A1c  Asthma -Advised patient to initiate therapy and make sure to use daily as directed  Follow-up:  3 weeks to follow-up on home BG and make sure patient got new inhaler and test strips   Michelle Warner, PharmD, DPLA

## 2024-01-21 ENCOUNTER — Other Ambulatory Visit: Payer: MEDICAID

## 2024-01-25 ENCOUNTER — Encounter: Payer: Self-pay | Admitting: Nurse Practitioner

## 2024-01-25 ENCOUNTER — Ambulatory Visit (INDEPENDENT_AMBULATORY_CARE_PROVIDER_SITE_OTHER): Payer: MEDICAID | Admitting: Nurse Practitioner

## 2024-01-25 VITALS — BP 95/64 | HR 79 | Temp 98.5°F | Ht 62.0 in | Wt 194.8 lb

## 2024-01-25 DIAGNOSIS — G4733 Obstructive sleep apnea (adult) (pediatric): Secondary | ICD-10-CM

## 2024-01-25 DIAGNOSIS — R011 Cardiac murmur, unspecified: Secondary | ICD-10-CM

## 2024-01-25 DIAGNOSIS — E119 Type 2 diabetes mellitus without complications: Secondary | ICD-10-CM

## 2024-01-25 DIAGNOSIS — E1142 Type 2 diabetes mellitus with diabetic polyneuropathy: Secondary | ICD-10-CM | POA: Diagnosis not present

## 2024-01-25 DIAGNOSIS — E113293 Type 2 diabetes mellitus with mild nonproliferative diabetic retinopathy without macular edema, bilateral: Secondary | ICD-10-CM

## 2024-01-25 DIAGNOSIS — E1169 Type 2 diabetes mellitus with other specified complication: Secondary | ICD-10-CM

## 2024-01-25 DIAGNOSIS — J4530 Mild persistent asthma, uncomplicated: Secondary | ICD-10-CM

## 2024-01-25 DIAGNOSIS — F419 Anxiety disorder, unspecified: Secondary | ICD-10-CM

## 2024-01-25 DIAGNOSIS — E1129 Type 2 diabetes mellitus with other diabetic kidney complication: Secondary | ICD-10-CM

## 2024-01-25 DIAGNOSIS — Z1231 Encounter for screening mammogram for malignant neoplasm of breast: Secondary | ICD-10-CM

## 2024-01-25 DIAGNOSIS — E1159 Type 2 diabetes mellitus with other circulatory complications: Secondary | ICD-10-CM

## 2024-01-25 DIAGNOSIS — E785 Hyperlipidemia, unspecified: Secondary | ICD-10-CM

## 2024-01-25 DIAGNOSIS — I152 Hypertension secondary to endocrine disorders: Secondary | ICD-10-CM

## 2024-01-25 DIAGNOSIS — Z794 Long term (current) use of insulin: Secondary | ICD-10-CM

## 2024-01-25 LAB — BAYER DCA HB A1C WAIVED: HB A1C (BAYER DCA - WAIVED): 7.2 % — ABNORMAL HIGH (ref 4.8–5.6)

## 2024-01-25 MED ORDER — FLUTICASONE-SALMETEROL 115-21 MCG/ACT IN AERO: 2.0000 | INHALATION_SPRAY | Freq: Two times a day (BID) | RESPIRATORY_TRACT | 12 refills | Status: AC

## 2024-01-25 MED ORDER — CITALOPRAM HYDROBROMIDE 20 MG PO TABS: 20.0000 mg | ORAL_TABLET | Freq: Every day | ORAL | 3 refills | Status: AC

## 2024-01-25 MED ORDER — GLIPIZIDE 10 MG PO TABS: 10.0000 mg | ORAL_TABLET | Freq: Two times a day (BID) | ORAL | 3 refills | Status: AC

## 2024-01-25 MED ORDER — GABAPENTIN 300 MG PO CAPS: 300.0000 mg | ORAL_CAPSULE | Freq: Three times a day (TID) | ORAL | 2 refills | Status: AC

## 2024-01-25 MED ORDER — BUSPIRONE HCL 5 MG PO TABS: 5.0000 mg | ORAL_TABLET | Freq: Two times a day (BID) | ORAL | 4 refills | Status: AC

## 2024-01-25 MED ORDER — FARXIGA 10 MG PO TABS: 10.0000 mg | ORAL_TABLET | Freq: Every day | ORAL | 1 refills | Status: AC

## 2024-01-25 MED ORDER — LISINOPRIL 10 MG PO TABS: 10.0000 mg | ORAL_TABLET | Freq: Every day | ORAL | 3 refills | Status: AC

## 2024-01-25 MED ORDER — TIRZEPATIDE 12.5 MG/0.5ML ~~LOC~~ SOAJ: 12.5000 mg | SUBCUTANEOUS | 2 refills | Status: DC

## 2024-01-25 MED ORDER — TRAZODONE HCL 100 MG PO TABS: 150.0000 mg | ORAL_TABLET | Freq: Every evening | ORAL | 2 refills | Status: AC | PRN

## 2024-01-25 MED ORDER — TIRZEPATIDE 12.5 MG/0.5ML ~~LOC~~ SOAJ: 12.5000 mg | SUBCUTANEOUS | 2 refills | Status: AC

## 2024-01-25 NOTE — Progress Notes (Unsigned)
   01/28/2024  Patient ID: Michelle Warner, female   DOB: 03-20-1976, 48 y.o.   MRN: [id number]  Outreach for schedule telephone follow-up visit was successful, but patient was asleep and requested to move visit to Thursday at 3pm.  Michelle Warner, PharmD, DPLA

## 2024-01-25 NOTE — Progress Notes (Signed)
 BP 95/64   Pulse 79   Temp 98.5 F (36.9 C) (Oral)   Ht 5' 2 (1.575 m)   Wt 194 lb 12.8 oz (88.4 kg)   SpO2 97%   BMI 35.63 kg/m    Subjective:    Patient ID: Michelle Warner, female    DOB: [birth year], 48 y.o.   MRN: [id number]  HPI: Michelle Warner is a 48 y.o. female  Chief Complaint  Patient presents with   Asthma   Depression   Diabetes   Hyperlipidemia   Hypertension   DIABETES August 6.7% A1c. Taking Mounjaro  10 MG weekly, Farxiga  10 daily, Glipizide  10 MG BID. Has not taken Lantus  in 24 days. Working with [facility] PharmD, [place]. Gabapentin  taken for neuropathy pain + Vitamin B12 supplements. Continues to lose weight, has lost 11 lbs since most recent visit.  HISTORY: Took Metformin  in past but this caused major GI issues. Hypoglycemic episodes:no Polydipsia/polyuria: no Visual disturbance: no Chest pain: no Paresthesias: no Glucose Monitoring: yes  Accucheck frequency: Daily  Fasting glucose: 89 to 147  Post prandial:  Evening:  Before meals: Taking Insulin ?: yes  Long acting insulin : has not taken in 24 days  Short acting insulin : Blood Pressure Monitoring: not checking Retinal Examination: Not Up to Date -- [facility] Foot Exam: Up to Date today Pneumovax: Refuses Influenza: Refuses Aspirin: no   HYPERTENSION / HYPERLIPIDEMIA Continues Lisinopril  20 MG daily and Crestor  20 MG daily.  History of sleep study with recommendation for weight loss, no CPAP.  Satisfied with current treatment? yes Duration of hypertension: chronic BP monitoring frequency: not checking BP range:  BP medication side effects: no Duration of hyperlipidemia: chronic Cholesterol medication side effects: no Cholesterol supplements: none Medication compliance: good compliance Aspirin: no Recent stressors: no Recurrent headaches: no Visual changes: no Palpitations: no Dyspnea: no Chest pain: no Lower extremity edema: no Dizzy/lightheaded: no   ASTHMA Using Advair 2  puffs BID + Albuterol  PRN. Is back living with a smoker. Asthma status: stable Satisfied with current treatment?: yes Albuterol /rescue inhaler frequency: not recently Dyspnea frequency: no Wheezing frequency: no Cough frequency: no Nocturnal symptom frequency: no Limitation of activity: no Current upper respiratory symptoms: no Triggers: pollen and cigarette smoke Home peak flows: none Aerochamber/spacer use: no Visits to ER or Urgent Care in past year: no Pneumovax: refused Influenza: refused   DEPRESSION Taking Celexa , Buspar , and Trazodone . Currently living with her husband's mother and family + her daughter (who is 41) is in jail due to fight with her boyfriend. Mood status: stable Satisfied with current treatment?: yes Symptom severity: moderate  Duration of current treatment : chronic Side effects: no Medication compliance: good compliance Psychotherapy/counseling: none Depressed mood: yes Anxious mood: yes Anhedonia: no Significant weight loss or gain: no Insomnia: yes hard to fall asleep -- uses Trazodone  as needed Fatigue: no Feelings of worthlessness or guilt: no Impaired concentration/indecisiveness: no Suicidal ideations: no Hopelessness: no Crying spells: no    01/25/2024    3:44 PM 01/25/2024    3:16 PM 11/06/2023   12:12 PM 10/23/2023    3:50 PM 07/16/2023    3:32 PM  Depression screen PHQ 2/9  Decreased Interest 0 1 0 2 0  Down, Depressed, Hopeless 0 3 0 1 1  PHQ - 2 Score 0 4 0 3 1  Altered sleeping 0 1  2 2   Tired, decreased energy 0 2  2 2   Change in appetite 0 0  0 2  Feeling bad  or failure about yourself  0 0  0 0  Trouble concentrating 0 0  0 0  Moving slowly or fidgety/restless 0 0  0 0  Suicidal thoughts 0 0  0 0  PHQ-9 Score 0 7  7  7    Difficult doing work/chores Not difficult at all Very difficult  Not difficult at all Not difficult at all     Data saved with a previous flowsheet row definition       01/25/2024    3:16 PM 11/06/2023    12:21 PM 10/23/2023    3:50 PM 07/16/2023    3:33 PM  GAD 7 : Generalized Anxiety Score  Nervous, Anxious, on Edge 2 1 1 1   Control/stop worrying 3 1 1 2   Worry too much - different things 3 1 2 1   Trouble relaxing 2 1 1  0  Restless 2 1 1  0  Easily annoyed or irritable 3 1 1 2   Afraid - awful might happen 0 0 0 0  Total GAD 7 Score 15 6 7 6   Anxiety Difficulty Somewhat difficult Not difficult at all Not difficult at all Not difficult at all   Relevant past medical, surgical, family and social history reviewed and updated as indicated. Interim medical history since our last visit reviewed. Allergies and medications reviewed and updated.  Review of Systems  Constitutional:  Negative for activity change, appetite change, diaphoresis, fatigue and fever.  Respiratory:  Negative for cough, chest tightness, shortness of breath and wheezing.   Cardiovascular:  Negative for chest pain, palpitations and leg swelling.  Gastrointestinal: Negative.   Endocrine: Negative for cold intolerance, heat intolerance, polydipsia, polyphagia and polyuria.  Neurological: Negative.   Psychiatric/Behavioral:  Negative for decreased concentration, self-injury, sleep disturbance and suicidal ideas. The patient is nervous/anxious.    Per HPI unless specifically indicated above     Objective:    BP 95/64   Pulse 79   Temp 98.5 F (36.9 C) (Oral)   Ht 5' 2 (1.575 m)   Wt 194 lb 12.8 oz (88.4 kg)   SpO2 97%   BMI 35.63 kg/m   Wt Readings from Last 3 Encounters:  01/25/24 194 lb 12.8 oz (88.4 kg)  10/23/23 205 lb 12.8 oz (93.4 kg)  07/16/23 211 lb 9.6 oz (96 kg)    Physical Exam Vitals and nursing note reviewed.  Constitutional:      General: She is awake. She is not in acute distress.    Appearance: She is well-developed and well-groomed. She is obese. She is not ill-appearing or toxic-appearing.  HENT:     Head: Normocephalic.     Right Ear: Hearing and external ear normal.     Left Ear: Hearing  and external ear normal.  Eyes:     General: Lids are normal.        Right eye: No discharge.        Left eye: No discharge.     Conjunctiva/sclera: Conjunctivae normal.     Pupils: Pupils are equal, round, and reactive to light.  Neck:     Thyroid: No thyromegaly.     Vascular: No carotid bruit.  Cardiovascular:     Rate and Rhythm: Normal rate and regular rhythm.     Heart sounds: Murmur heard.     Systolic murmur is present with a grade of 2/6.     No gallop.  Pulmonary:     Effort: Pulmonary effort is normal. No accessory muscle usage or respiratory distress.  Breath sounds: Normal breath sounds.  Abdominal:     General: Bowel sounds are normal. There is no distension.     Palpations: Abdomen is soft.     Tenderness: There is no abdominal tenderness.  Musculoskeletal:     Cervical back: Normal range of motion and neck supple.     Right lower leg: No edema.     Left lower leg: No edema.  Lymphadenopathy:     Cervical: No cervical adenopathy.  Skin:    General: Skin is warm and dry.  Neurological:     Mental Status: She is alert and oriented to person, place, and time.     Deep Tendon Reflexes: Reflexes are normal and symmetric.     Reflex Scores:      Brachioradialis reflexes are 2+ on the right side and 2+ on the left side.      Patellar reflexes are 2+ on the right side and 2+ on the left side. Psychiatric:        Attention and Perception: Attention normal.        Mood and Affect: Mood normal.        Speech: Speech normal.        Behavior: Behavior normal. Behavior is cooperative.        Thought Content: Thought content normal.    Diabetic Foot Exam - Simple   Simple Foot Form Visual Inspection No deformities, no ulcerations, no other skin breakdown bilaterally: Yes Sensation Testing Intact to touch and monofilament testing bilaterally: Yes Pulse Check Posterior Tibialis and Dorsalis pulse intact bilaterally: Yes Comments     Results for orders placed or  performed in visit on 01/25/24  Bayer DCA Hb A1c Waived   Collection Time: 01/25/24  2:58 PM  Result Value Ref Range   HB A1C (BAYER DCA - WAIVED) 7.2 (H) 4.8 - 5.6 %  Comprehensive metabolic panel with GFR   Collection Time: 01/25/24  3:00 PM  Result Value Ref Range   Glucose 229 (H) 70 - 99 mg/dL   BUN 11 6 - 24 mg/dL   Creatinine, Ser 9.24 0.57 - 1.00 mg/dL   eGFR 99 >40 fO/fpw/8.26   BUN/Creatinine Ratio 15 9 - 23   Sodium 139 134 - 144 mmol/L   Potassium 4.3 3.5 - 5.2 mmol/L   Chloride 105 96 - 106 mmol/L   CO2 22 20 - 29 mmol/L   Calcium  9.1 8.7 - 10.2 mg/dL   Total Protein 6.7 6.0 - 8.5 g/dL   Albumin 4.2 3.9 - 4.9 g/dL   Globulin, Total 2.5 1.5 - 4.5 g/dL   Bilirubin Total 0.2 0.0 - 1.2 mg/dL   Alkaline Phosphatase 63 41 - 116 IU/L   AST 15 0 - 40 IU/L   ALT 14 0 - 32 IU/L  Lipid Panel w/o Chol/HDL Ratio   Collection Time: 01/25/24  3:00 PM  Result Value Ref Range   Cholesterol, Total 127 100 - 199 mg/dL   Triglycerides 844 (H) 0 - 149 mg/dL   HDL 42 >60 mg/dL   VLDL Cholesterol Cal 26 5 - 40 mg/dL   LDL Chol Calc (NIH) 59 0 - 99 mg/dL      Assessment & Plan:   Problem List Items Addressed This Visit       Cardiovascular and Mediastinum   Hypertension associated with diabetes (HCC)   Chronic, stable. BP well below goal in office, on lower side. Currently on Lisinopril  for kidney protection and BP, may benefit from switch  to ARB in future due to her underlying asthma. At this time will lower Lisinopril  to 10 MG due to BP being on lower side. Discussed with her today. Recommend she monitor BP at least a few mornings a week at home and document.  DASH diet at home.  Labs today: A1c and CMP.  Urine ALB 150 January 2025.  Return in 3 months.       Relevant Medications   EPINEPHrine  0.3 mg/0.3 mL IJ SOAJ injection   tirzepatide  (MOUNJARO ) 12.5 MG/0.5ML Pen   lisinopril  (ZESTRIL ) 10 MG tablet   FARXIGA  10 MG TABS tablet   glipiZIDE  (GLUCOTROL ) 10 MG tablet   Other  Relevant Orders   Bayer DCA Hb A1c Waived (Completed)     Respiratory   Obstructive sleep apnea of adult   Reports she was told she did not need CPAP, but to focus on weight loss -- may benefit repeat study in future. Is working on weight loss at present.      Mild persistent asthma without complication   Chronic, ongoing. Continue Advair HFA and Albuterol  as needed. Continue modest weight loss.  Labs today: up to date.  She is aware to rinse mouth well after using Advair. Spirometry next visit.      Relevant Medications   fluticasone -salmeterol (ADVAIR HFA) 115-21 MCG/ACT inhaler     Endocrine   Type 2 diabetes mellitus with diabetic polyneuropathy, with long-term current use of insulin  (HCC) - Primary   Chronic, ongoing with A1c 7.2% today, trend up but is losing more weight with Mounjaro  on board and tolerating + has not taken insulin  in 24 days. Urine ALB 150 January 2025. Continue Gabapentin  and B12 for neuropathy.   - Increase Mounjaro  to 12.5 MG weekly (will continue to work with PharmD on this and adjust as needed) and continue Farxiga  daily.  At this time continue Glipizide  10 MG BID and remain off insulin  (has been off of this for 24 days). Next goal would be to get to point where we can lower Glipizide . - Check BS TID and document for visits.   - Not up to date on eye and is aware to schedule. Up to date on foot exam - On ACE and Statin - Refuses PCV20 and Flu - Return in 3 months.      Relevant Medications   tirzepatide  (MOUNJARO ) 12.5 MG/0.5ML Pen   lisinopril  (ZESTRIL ) 10 MG tablet   gabapentin  (NEURONTIN ) 300 MG capsule   FARXIGA  10 MG TABS tablet   busPIRone  (BUSPAR ) 5 MG tablet   citalopram  (CELEXA ) 20 MG tablet   glipiZIDE  (GLUCOTROL ) 10 MG tablet   traZODone  (DESYREL ) 100 MG tablet   Other Relevant Orders   Bayer DCA Hb A1c Waived (Completed)   Type 2 diabetes mellitus with proteinuria (HCC)   Chronic, ongoing with A1c 7.2% today, trend up but is losing more  weight with Mounjaro  on board and tolerating + has not taken insulin  in 24 days. Urine ALB 150 January 2025.  - Increase Mounjaro  to 12.5 MG weekly (will continue to work with PharmD on this and adjust as needed) and continue Farxiga  daily.  At this time continue Glipizide  10 MG BID and remain off insulin  (has been off of this for 24 days). Next goal would be to get to point where we can lower Glipizide . - Check BS TID and document for visits.   - Not up to date on eye and is aware to schedule. Up to date on foot exam -  On ACE and Statin - Refuses PCV20 and Flu - Return in 3 months.      Relevant Medications   tirzepatide  (MOUNJARO ) 12.5 MG/0.5ML Pen   lisinopril  (ZESTRIL ) 10 MG tablet   FARXIGA  10 MG TABS tablet   glipiZIDE  (GLUCOTROL ) 10 MG tablet   Other Relevant Orders   Bayer DCA Hb A1c Waived (Completed)   Type 2 diabetes mellitus with both eyes affected by mild nonproliferative retinopathy without macular edema, with long-term current use of insulin  (HCC)   Chronic, ongoing with A1c 7.2% today, trend up but is losing more weight with Mounjaro  on board and tolerating + has not taken insulin  in 24 days. Urine ALB 150 January 2025.  - Increase Mounjaro  to 12.5 MG weekly (will continue to work with PharmD on this and adjust as needed) and continue Farxiga  daily.  At this time continue Glipizide  10 MG BID and remain off insulin  (has been off of this for 24 days). Next goal would be to get to point where we can lower Glipizide . - Check BS TID and document for visits.   - Not up to date on eye and is aware to schedule due to her retinopathy. Up to date on foot exam - On ACE and Statin - Refuses PCV20 and Flu - Return in 3 months.      Relevant Medications   tirzepatide  (MOUNJARO ) 12.5 MG/0.5ML Pen   lisinopril  (ZESTRIL ) 10 MG tablet   FARXIGA  10 MG TABS tablet   glipiZIDE  (GLUCOTROL ) 10 MG tablet   Other Relevant Orders   Bayer DCA Hb A1c Waived (Completed)   Hyperlipidemia  associated with type 2 diabetes mellitus (HCC)   Chronic, ongoing.  Continue current medication regimen and adjust as needed.  Lipid panel today.      Relevant Medications   EPINEPHrine  0.3 mg/0.3 mL IJ SOAJ injection   tirzepatide  (MOUNJARO ) 12.5 MG/0.5ML Pen   lisinopril  (ZESTRIL ) 10 MG tablet   FARXIGA  10 MG TABS tablet   glipiZIDE  (GLUCOTROL ) 10 MG tablet   Other Relevant Orders   Bayer DCA Hb A1c Waived (Completed)   Comprehensive metabolic panel with GFR (Completed)   Lipid Panel w/o Chol/HDL Ratio (Completed)   Diabetes mellitus treated with injections of non-insulin  medication (HCC)   Refer to diabetes with proteinuria plan of care.      Relevant Medications   tirzepatide  (MOUNJARO ) 12.5 MG/0.5ML Pen   lisinopril  (ZESTRIL ) 10 MG tablet   FARXIGA  10 MG TABS tablet   glipiZIDE  (GLUCOTROL ) 10 MG tablet   Other Relevant Orders   Bayer DCA Hb A1c Waived (Completed)     Other   Morbid obesity (HCC)   BMI 35.63 with T2DM, HLD, HTN.  Continues to lose weight on Mounjaro , 11 lbs since last visit. Recommended eating smaller high protein, low fat meals more frequently and exercising 30 mins a day 5 times a week with a goal of 10-15lb weight loss in the next 3 months. Patient voiced their understanding and motivation to adhere to these recommendations.       Relevant Medications   tirzepatide  (MOUNJARO ) 12.5 MG/0.5ML Pen   FARXIGA  10 MG TABS tablet   glipiZIDE  (GLUCOTROL ) 10 MG tablet   Heart murmur, systolic   Ongoing with history of work-up in past. She denies any symptoms. Will obtain echo if worsening or symptoms present.      Anxiety   Chronic, stable.  Was followed by [facility] in past, but PCP currently fills her medications.  Will return to them if  any worsening mood.  Denies SI/HI.  Continue current medication regimen and adjust as needed.      Relevant Medications   busPIRone  (BUSPAR ) 5 MG tablet   citalopram  (CELEXA ) 20 MG tablet   traZODone  (DESYREL ) 100 MG tablet    Other Visit Diagnoses       Encounter for screening mammogram for malignant neoplasm of breast       Mammogram ordered and instructed how to schedule.   Relevant Orders   MM 3D SCREENING MAMMOGRAM BILATERAL BREAST        Follow up plan: Return in about 3 months (around 04/26/2024) for T2DM, HTN/HLD, Depression, ANXIETY, Asthma.

## 2024-01-26 ENCOUNTER — Ambulatory Visit: Payer: Self-pay | Admitting: Nurse Practitioner

## 2024-01-26 LAB — COMPREHENSIVE METABOLIC PANEL WITH GFR
ALT: 14 IU/L (ref 0–32)
AST: 15 IU/L (ref 0–40)
Albumin: 4.2 g/dL (ref 3.9–4.9)
Alkaline Phosphatase: 63 IU/L (ref 41–116)
BUN/Creatinine Ratio: 15 (ref 9–23)
BUN: 11 mg/dL (ref 6–24)
Bilirubin Total: 0.2 mg/dL (ref 0.0–1.2)
CO2: 22 mmol/L (ref 20–29)
Calcium: 9.1 mg/dL (ref 8.7–10.2)
Chloride: 105 mmol/L (ref 96–106)
Creatinine, Ser: 0.75 mg/dL (ref 0.57–1.00)
Globulin, Total: 2.5 g/dL (ref 1.5–4.5)
Glucose: 229 mg/dL — ABNORMAL HIGH (ref 70–99)
Potassium: 4.3 mmol/L (ref 3.5–5.2)
Sodium: 139 mmol/L (ref 134–144)
Total Protein: 6.7 g/dL (ref 6.0–8.5)
eGFR: 99 mL/min/1.73

## 2024-01-26 LAB — LIPID PANEL W/O CHOL/HDL RATIO
Cholesterol, Total: 127 mg/dL (ref 100–199)
HDL: 42 mg/dL (ref >39)
LDL Chol Calc (NIH): 59 mg/dL (ref 0–99)
Triglycerides: 155 mg/dL — ABNORMAL HIGH (ref 0–149)
VLDL Cholesterol Cal: 26 mg/dL (ref 5–40)

## 2024-01-26 NOTE — Assessment & Plan Note (Signed)
 Chronic, stable. BP well below goal in office, on lower side. Currently on Lisinopril  for kidney protection and BP, may benefit from switch to ARB in future due to her underlying asthma. At this time will lower Lisinopril  to 10 MG due to BP being on lower side. Discussed with her today. Recommend she monitor BP at least a few mornings a week at home and document.  DASH diet at home.  Labs today: A1c and CMP.  Urine ALB 150 January 2025.  Return in 3 months.

## 2024-01-26 NOTE — Assessment & Plan Note (Signed)
 BMI 35.63 with T2DM, HLD, HTN.  Continues to lose weight on Mounjaro , 11 lbs since last visit. Recommended eating smaller high protein, low fat meals more frequently and exercising 30 mins a day 5 times a week with a goal of 10-15lb weight loss in the next 3 months. Patient voiced their understanding and motivation to adhere to these recommendations.

## 2024-01-26 NOTE — Assessment & Plan Note (Addendum)
 Chronic, ongoing with A1c 7.2% today, trend up but is losing more weight with Mounjaro  on board and tolerating + has not taken insulin  in 24 days. Urine ALB 150 January 2025.  - Increase Mounjaro  to 12.5 MG weekly (will continue to work with PharmD on this and adjust as needed) and continue Farxiga  daily.  At this time continue Glipizide  10 MG BID and remain off insulin  (has been off of this for 24 days). Next goal would be to get to point where we can lower Glipizide . - Check BS TID and document for visits.   - Not up to date on eye and is aware to schedule due to her retinopathy. Up to date on foot exam - On ACE and Statin - Refuses PCV20 and Flu - Return in 3 months.

## 2024-01-26 NOTE — Assessment & Plan Note (Signed)
 Ongoing with history of work-up in past. She denies any symptoms. Will obtain echo if worsening or symptoms present.

## 2024-01-26 NOTE — Assessment & Plan Note (Signed)
 Chronic, ongoing. Continue Advair HFA and Albuterol  as needed. Continue modest weight loss.  Labs today: up to date.  She is aware to rinse mouth well after using Advair. Spirometry next visit.

## 2024-01-26 NOTE — Progress Notes (Signed)
 Contacted via MyChart  Good morning Michelle Warner, your labs have returned: - Kidney function, creatinine and eGFR, remains normal, as is liver function, AST and ALT.  - Lipid panel shows at goal LDL, but triglycerides remain a little elevated. Continue focus on healthy diet at home. Any questions? Keep being amazing!!  Thank you for allowing me to participate in your care.  I appreciate you. Kindest regards, Anaira Seay

## 2024-01-26 NOTE — Assessment & Plan Note (Signed)
 Chronic, stable.  Was followed by [facility] in past, but PCP currently fills her medications.  Will return to them if any worsening mood.  Denies SI/HI.  Continue current medication regimen and adjust as needed.

## 2024-01-26 NOTE — Assessment & Plan Note (Signed)
 Chronic, ongoing.  Continue current medication regimen and adjust as needed. Lipid panel today.

## 2024-01-26 NOTE — Assessment & Plan Note (Addendum)
 Chronic, ongoing with A1c 7.2% today, trend up but is losing more weight with Mounjaro  on board and tolerating + has not taken insulin  in 24 days. Urine ALB 150 January 2025.  - Increase Mounjaro  to 12.5 MG weekly (will continue to work with PharmD on this and adjust as needed) and continue Farxiga  daily.  At this time continue Glipizide  10 MG BID and remain off insulin  (has been off of this for 24 days). Next goal would be to get to point where we can lower Glipizide . - Check BS TID and document for visits.   - Not up to date on eye and is aware to schedule. Up to date on foot exam - On ACE and Statin - Refuses PCV20 and Flu - Return in 3 months.

## 2024-01-26 NOTE — Assessment & Plan Note (Signed)
 Chronic, ongoing with A1c 7.2% today, trend up but is losing more weight with Mounjaro  on board and tolerating + has not taken insulin  in 24 days. Urine ALB 150 January 2025. Continue Gabapentin  and B12 for neuropathy.   - Increase Mounjaro  to 12.5 MG weekly (will continue to work with PharmD on this and adjust as needed) and continue Farxiga  daily.  At this time continue Glipizide  10 MG BID and remain off insulin  (has been off of this for 24 days). Next goal would be to get to point where we can lower Glipizide . - Check BS TID and document for visits.   - Not up to date on eye and is aware to schedule. Up to date on foot exam - On ACE and Statin - Refuses PCV20 and Flu - Return in 3 months.

## 2024-01-26 NOTE — Assessment & Plan Note (Signed)
 Refer to diabetes with proteinuria plan of care.

## 2024-01-26 NOTE — Assessment & Plan Note (Signed)
 Reports she was told she did not need CPAP, but to focus on weight loss -- may benefit repeat study in future. Is working on weight loss at present.

## 2024-01-28 ENCOUNTER — Other Ambulatory Visit: Payer: Self-pay

## 2024-01-30 ENCOUNTER — Telehealth: Payer: Self-pay | Admitting: Pharmacy Technician

## 2024-01-30 ENCOUNTER — Other Ambulatory Visit (HOSPITAL_COMMUNITY): Payer: Self-pay

## 2024-01-30 NOTE — Progress Notes (Signed)
   01/31/2024  Patient ID: Michelle Warner, female   DOB: [birth year], 48 y.o.   MRN: [id number]  Subjective/Objective Telephone visit to follow-up on management of diabetes   Diabetes Management Plan -Current medications:  Mounjaro  12.5mg  weekly, glipizide  10mg  daily, Farxiga  10mg  daily -Patient has tried metformin  several times in the past and cannot tolerate -She continues to have occasional stomach cramping and pain at times- she does endorse recent increase in greasy foods, which is likely contributing to this -A1c 11/7 7.2%, slightly up from 6.7% r -Using Libre 3+ for CGM -She has not yet picked up Freestyle Neo test strips to have as back up for fingersticks but order was sent to pharmacy  -Patient had not used Lantus  in several weeks based on BG readings -Mounjaro  12.5mg  started and patient has taken 2 doses of this so far -Patient only takes glipizide  10mg  daily verus BID, as prescribed.   -FBG remains <100, but BG will increase to around 200 after eating but usually comes back down within 1-2 hours -Has not experienced any recent hypoglycemia -ACEi/ARB on board for cardiorenal protection:  lisinopril  10mg  daily- dose recently decreased after BP of 95/64 at OV -Statin on board for ASCVD risk reduction:  rosuvastatin  20mg  daily; LDL 59, TG 155 on 11/7  Asthma -Current medications: albuterol  rescue inhaler PRN, albuterol  nebulizer solution PRN -PCP recently ordered generic Advair HFA 115/21, and this is filled at [facility] currently- patient plans to pick up this week or weekend   Assessment/Plan   Diabetes Management Plan -Controlled based on most recent A1c -Cardiorenal protection is maxamized -LDL is controlled -BP is controlled -Continue current regimen at this time -Continue Libre 3 for CGM  Asthma -Advised patient to initiate Advair HFA 2 puffs BID as soon as possible -Counseled to use this daily as maintenance inhaler and to rinse mouth after each use  Follow-up:  2  months   Michelle Warner Michelle Warner, PharmD, DPLA

## 2024-01-30 NOTE — Telephone Encounter (Signed)
 Pharmacy Patient Advocate Encounter   Received notification from Onbase that prior authorization for Fluticasone -Salmeterol 115-21MCG/ACT aerosol  is required/requested.   Insurance verification completed.   The patient is insured through [employer] [place] [employer].   Per test claim:  brand name Advair HFA is preferred by the insurance.  If suggested medication is appropriate, Please send in a new RX and discontinue this one. If not, please advise as to why it's not appropriate so that we may request a Prior Authorization. Please note, some preferred medications may still require a PA.  If the suggested medications have not been trialed and there are no contraindications to their use, the PA will not be submitted, as it will not be approved.  New prescription is not required. Medication was processed for brand name on 01/28/2024.

## 2024-01-31 ENCOUNTER — Other Ambulatory Visit: Payer: Self-pay

## 2024-01-31 DIAGNOSIS — E1159 Type 2 diabetes mellitus with other circulatory complications: Secondary | ICD-10-CM

## 2024-01-31 DIAGNOSIS — E1142 Type 2 diabetes mellitus with diabetic polyneuropathy: Secondary | ICD-10-CM

## 2024-01-31 DIAGNOSIS — E1169 Type 2 diabetes mellitus with other specified complication: Secondary | ICD-10-CM

## 2024-02-04 ENCOUNTER — Other Ambulatory Visit: Payer: Self-pay

## 2024-02-04 DIAGNOSIS — N393 Stress incontinence (female) (male): Secondary | ICD-10-CM

## 2024-02-21 ENCOUNTER — Other Ambulatory Visit (HOSPITAL_COMMUNITY): Payer: Self-pay

## 2024-03-24 ENCOUNTER — Telehealth: Payer: Self-pay | Admitting: Pharmacy Technician

## 2024-03-24 NOTE — Telephone Encounter (Signed)
 Pharmacy Patient Advocate Encounter   Received notification from Onbase that prior authorization for Ozempic  (2 MG/DOSE) 8MG /3ML pen-injectors is due for renewal.   Insurance verification completed.   The patient is insured through [employer] [place] [employer].  Action: Medication has been discontinued. Archived Key: [id number]

## 2024-03-26 NOTE — Progress Notes (Signed)
 "  03/27/24  Patient ID: Michelle Warner, female   DOB: 1975/05/27, 49 y.o.   MRN: [id number]  Subjective/Objective Telephone visit to follow-up on management of diabetes   Diabetes Management Plan -Current medications:  Mounjaro  12.5mg  weekly, glipizide  10mg  daily, Farxiga  10mg  daily -Patient has tried metformin  several times in the past and cannot tolerate -A1c 11/7 7.2%, slightly up from 6.7%  -Using Libre 3+ for CGM and states FBG averaging 70-110; post-prandial staying <180 -Does not endorse any s/sx of hypoglycemia, but will get snack if readings in the 70's to prevent from going lower -She has not yet picked up Freestyle Neo test strips to have as back up for fingersticks but order was sent to pharmacy  -Patient only takes glipizide  10mg  daily verus BID, as prescribed -ACEi/ARB on board for cardiorenal protection:  lisinopril  10mg  daily- dose recently decreased after BP of 95/64 at OV.  Patient does not have home blood pressure monitor. -UACR 30-300 April 2025 -Statin on board for ASCVD risk reduction:  rosuvastatin  20mg  daily  Asthma -Current medications: albuterol  rescue inhaler PRN, albuterol  nebulizer solution PRN -PCP ordered generic Advair HFA 115/21, and this was filled at [facility], but patient has not been able to pick up yet  Lab Results  Component Value Date   HGBA1C 7.2 (H) 01/25/2024   HGBA1C 6.7 (H) 10/23/2023   HGBA1C 7.5 (H) 07/16/2023      Component Value Date/Time   NA 139 01/25/2024 1500   NA 140 08/24/2011 0259   K 4.3 01/25/2024 1500   K 4.0 08/24/2011 0259   CL 105 01/25/2024 1500   CL 105 08/24/2011 0259   CO2 22 01/25/2024 1500   CO2 25 08/24/2011 0259   GLUCOSE 229 (H) 01/25/2024 1500   GLUCOSE 232 (H) 01/11/2023 1257   GLUCOSE 254 (H) 08/24/2011 0259   BUN 11 01/25/2024 1500   BUN 7 08/24/2011 0259   CREATININE 0.75 01/25/2024 1500   CREATININE 0.71 08/24/2011 0259   CALCIUM  9.1 01/25/2024 1500   CALCIUM  8.9 08/24/2011 0259   PROT 6.7  01/25/2024 1500   ALBUMIN 4.2 01/25/2024 1500   AST 15 01/25/2024 1500   ALT 14 01/25/2024 1500   ALKPHOS 63 01/25/2024 1500   BILITOT 0.2 01/25/2024 1500   EGFR 99 01/25/2024 1500   GFRNONAA >60 01/11/2023 1257   GFRNONAA >60 08/24/2011 0259      Component Value Date/Time   CHOL 127 01/25/2024 1500   TRIG 155 (H) 01/25/2024 1500   HDL 42 01/25/2024 1500   CHOLHDL 3.5 01/24/2018 1945   LDLCALC 59 01/25/2024 1500   LDLDIRECT 122 (H) 10/02/2017 1813   LABVLDL 26 01/25/2024 1500    Assessment/Plan   Diabetes Management Plan -A1c not at goal of <7%, but home BG reflects improved control -UACR not at goal, but patient is on SGLT2 and ACEi- continue to monitor -BP at goal of <130/80, but I have concern with hypotension- assisting patient in obtaining home BP monitor, so she can begin to monitor/record home readings- may need to decrease lisinopril  dose further -LDL at goal of <70 -Continue current regimen at this time -Continue Libre 3 for CGM -Contacting pharmacy to check on coverage/cost of Neo test strips previously sent in -Patient sees PCP again 2/9 and will be due for A1c, UACR, CMP  Asthma -Advised patient to initiate Advair HFA 2 puffs BID as soon as possible; states she should be able to get within the next week or so -Counseled to use this daily  as maintenance inhaler and to rinse mouth after each use  Follow-up:  2 months   [name], PharmD, DPLA "

## 2024-03-27 ENCOUNTER — Other Ambulatory Visit: Payer: MEDICAID

## 2024-03-27 DIAGNOSIS — Z7985 Long-term (current) use of injectable non-insulin antidiabetic drugs: Secondary | ICD-10-CM

## 2024-03-27 DIAGNOSIS — J4530 Mild persistent asthma, uncomplicated: Secondary | ICD-10-CM

## 2024-03-27 DIAGNOSIS — E1159 Type 2 diabetes mellitus with other circulatory complications: Secondary | ICD-10-CM

## 2024-03-27 DIAGNOSIS — E1142 Type 2 diabetes mellitus with diabetic polyneuropathy: Secondary | ICD-10-CM

## 2024-03-27 DIAGNOSIS — I152 Hypertension secondary to endocrine disorders: Secondary | ICD-10-CM

## 2024-03-27 DIAGNOSIS — E1169 Type 2 diabetes mellitus with other specified complication: Secondary | ICD-10-CM

## 2024-03-28 ENCOUNTER — Telehealth: Payer: Self-pay | Admitting: Pharmacy Technician

## 2024-03-28 NOTE — Progress Notes (Signed)
" ° °  03/28/2024 Name: Michelle Warner MRN: [id number] DOB: 08/01/1975  Patient is appearing for a follow-up visit with the population health pharmacy technician. Last engaged with the clinical pharmacist to discuss medication management on 03/27/2024. Contacted pharmacy and patient today to discuss medication access.   Plan from last clinical pharmacist appointment:  Diabetes Management Plan -A1c not at goal of <7%, but home BG reflects improved control -UACR not at goal, but patient is on SGLT2 and ACEi- continue to monitor -BP at goal of <130/80, but I have concern with hypotension- assisting patient in obtaining home BP monitor, so she can begin to monitor/record home readings- may need to decrease lisinopril  dose further -LDL at goal of <70 -Continue current regimen at this time -Continue Libre 3 for CGM -Contacting pharmacy to check on coverage/cost of Neo test strips previously sent in -Patient sees PCP again 2/9 and will be due for A1c, UACR, CMP   Asthma -Advised patient to initiate Advair HFA 2 puffs BID as soon as possible; states she should be able to get within the next week or so -Counseled to use this daily as maintenance inhaler and to rinse mouth after each use   Follow-up:  2 months(copy/paste from last note)   Medication Adherence Barriers Identified:  Access issues with any new medication or testing device: Yes Advair HFA and Freestyle Neo test strips   Medication Adherence Barriers Addressed/Actions Taken:  Reviewed medication changes per plan from last clinical pharmacist note Medication Access for Advair and Freestyle Neo test strips Will discuss medication access concerns with pharmacist Contacted pharmacy regarding new prescriptions Called [facility] pharmacy in regard to refilling prescriptions. [facility] staff was able to refill Advair HFA inhaler and test strips. Per pharmacy staff member, the cost for 1 month supply of FreeStyle Neo test strips is $9.97 and  Advair inhaler is $4/ She informs patient will receive a text message when ready for the medications to be picked up. Outreached patient to discuss medication access medication management. Left voicemail for patient to return my call at their convenience.    Next clinical pharmacist appointment is scheduled for: 05/29/2024  Dequan Kindred, CPhT [facility] Office: [phone] Email: [email or web address]   "

## 2024-03-30 ENCOUNTER — Other Ambulatory Visit: Payer: Self-pay | Admitting: Nurse Practitioner

## 2024-03-31 ENCOUNTER — Telehealth: Payer: Self-pay | Admitting: Pharmacy Technician

## 2024-03-31 NOTE — Progress Notes (Signed)
" ° °  03/31/2024  Patient ID: Michelle Warner, female   DOB: 1976/03/04, 49 y.o.   MRN: [id number]  Patient engaged with clinical pharmacist for management of medication management on 03/27/2024. Outreach by [employer] technician was requested.   Outreached patient to discuss medication access medication management. Left voicemail for patient to return my call at their convenience.    [name], CPhT [facility] Office: [phone] Email: [email or web address]  "

## 2024-04-02 ENCOUNTER — Telehealth: Payer: Self-pay | Admitting: Pharmacy Technician

## 2024-04-02 ENCOUNTER — Telehealth: Payer: Self-pay

## 2024-04-02 NOTE — Progress Notes (Signed)
" ° °  04/02/2024 Name: Michelle Warner MRN: [id number] DOB: November 11, 1975  Patient is appearing for a follow-up visit with the population health pharmacy technician. Last engaged with the clinical pharmacist to discuss medication management on 03/27/2024. Contacted patient today to discuss medication access.   Plan from last clinical pharmacist appointment:  -Diabetes Management Plan A1c not at goal of <7%, but home BG reflects improved control -UACR not at goal, but patient is on SGLT2 and ACEi- continue to monitor -BP at goal of <130/80, but I have concern with hypotension- assisting patient in obtaining home BP monitor, so she can begin to monitor/record home readings- may need to decrease lisinopril  dose further -LDL at goal of <70 -Continue current regimen at this time -Continue Libre 3 for CGM -Contacting pharmacy to check on coverage/cost of Neo test strips previously sent in -Patient sees PCP again 2/9 and will be due for A1c, UACR, CMP Asthma -Advised patient to initiate Advair HFA 2 puffs BID as soon as possible; states she should be able to get within the next week or so -Counseled to use this daily as maintenance inhaler and to rinse mouth after each use Follow-up:  2 months(copy/paste from last note)   Medication Adherence Barriers Identified:  Access issues with any new medication or testing device: Yes Advair , Freestyle Neo test strips, FreeStyle Sensors   Medication Adherence Barriers Addressed/Actions Taken:  Reviewed medication changes per plan from last clinical pharmacist note Medication Access for Advair , Freestyle Neo test strips, FreeStyle Sensors Will discuss medication access concerns with pharmacist Collaborated with PharmD regarding prior authorization Contacted pharmacy regarding new prescriptions Educated patient to go to pharmacy to pick up Advair and FreeStyle Neo strips that were called in for refills on 03/28/2024. Patient was provided prices of $4 and $9.97  respectively as outlined by pharmacy staff Patient informs today that she is out of FreeStyle sensors. Patient informs she could tell her blood sugar dropped yesterday but had not way to check it and see at what levei it was. She informs she ate some bread. She informs this morning she ate her breakfast and had some Coca-Cola and was feeling better. [facility]. Spoke to pharmacist who informs the prescription that was sent in on 04/01/24 for Sensors was receive but placed on patient's profile as the prescription is requiring a Prior Auth. Reached out to clinic PharmD for assistance in getting PA completed. Patient was called back and informed that a PA can take a few days. Patient was encouraged to pick up the strips and monitor blood sugar using the strips while awaiting for PA to be approved. Patient verbalized understanding. Reviewed instructions for monitoring blood sugars at home and reminded patient to keep a written log to review with pharmacist Reminded patient of date/time of upcoming clinical pharmacist follow up and any upcoming PCP/specialists visits. Patient denies transportation barriers to the appointment. Yes  Next clinical pharmacist appointment is scheduled for: 05/29/2024  Kate Caddy, CPhT [facility] Office: [phone] Email: [email or web address]   "

## 2024-04-02 NOTE — Progress Notes (Signed)
" ° °  04/02/2024  Patient ID: Michelle Warner, female   DOB: 07-Nov-1975, 49 y.o.   MRN: [id number]  Patient outreach to inform Michelle Warner I have submitted a PA request to insurance for [place] 3+ sensors.  Since she is no longer using insulin , I am unsure if Medicaid will cover; but I will monitor the status and keep her posted.  Home BP monitor has been left at the front for the patient to come by and pick up or get at upcoming PCP follow-up on 2/9.  Michelle Warner, PharmD, DPLA  "

## 2024-04-03 ENCOUNTER — Telehealth: Payer: Self-pay

## 2024-04-03 MED ORDER — ACCU-CHEK SOFTCLIX LANCETS MISC: 12 refills | Status: AC

## 2024-04-03 MED ORDER — ACCU-CHEK GUIDE TEST VI STRP: ORAL_STRIP | 12 refills | Status: AC

## 2024-04-03 MED ORDER — ACCU-CHEK GUIDE ME W/DEVICE KIT: PACK | 0 refills | Status: AC

## 2024-04-03 NOTE — Progress Notes (Signed)
" ° °  04/03/2024  Patient ID: Michelle Warner, female   DOB: 1975/07/02, 49 y.o.   MRN: [id number]  PA for Libre 3+ sensors has been denied since patient is no longer using daily insulin  injections.  Test claims reflect Accu Chek Guide meter and strips and Softclix lancets are covered at $0 copay, so I have sent orders to the patient's pharmacy for these testing supplies.  Coordinating with CPhT to see if she can verify coverage and $0 copay with pharmacy and follow-up with patient.  Michelle Warner, PharmD, DPLA  "

## 2024-04-04 ENCOUNTER — Telehealth: Payer: Self-pay | Admitting: Pharmacy Technician

## 2024-04-04 NOTE — Progress Notes (Signed)
" ° °  04/04/2024 Name: Michelle Warner MRN: [id number] DOB: [birth year]  Patient is appearing for a follow-up visit with the population health pharmacy technician. Last engaged with the clinical pharmacist to discuss medication management and medication assistance on 04/02/2024. Contacted patient today to discuss medication access.   Plan from last clinical pharmacist appointment:  Patient outreach to inform Michelle Warner I have submitted a PA request to insurance for [place] 3+ sensors. Since she is no longer using insulin , I am unsure if Medicaid will cover; but I will monitor the status and keep her posted. Home BP monitor has been left at the front for the patient to come by and pick up or get at upcoming PCP follow-up on 2/9 (copy/paste from last note)   Medication Adherence Barriers Identified:  Access issues with any new medication or testing device: Yes FreeStyle Sensor and AccuChek Guide meter, strips and lancets, Vitamin d  50,000 units and Advair inhaler   Medication Adherence Barriers Addressed/Actions Taken:  Reviewed medication changes per plan from last clinical pharmacist note Medication Access for  Access issues with any new medication or testing device: Yes FreeStyle Sensor and AccuChek Guide meter, strips and lancets, Vitamin d  50,000 units and Advair inhaler Will discuss medication access concerns with pharmacist Contacted pharmacy regarding new prescriptions and refills Spoke to [facility] at [place] who informs the order for AccuChek Guide meter, strips and lancets were received. [place] informs the cost to the patient is [employer] informs these prescriptions will be ready after 4pm today Called patient. HIPAA verified. Patient was updated that Sensors PA was denied due to patient not being on daily insulin . Patient was updated that a meter, strips and lancets were called into [facility] for her. She was advised of cost and when they would be ready. Patient informs she does not like  pricking her finger but she will pick up supplies and start using. Patient also reminded that Advair inhaler was ready for her to pick up for $4. Patient was informed FreeStyle Neo Precision strips were available for pickup for a copay of $9.97 but she did not have to purchase these since she will be using a glucometer but she could if she wanted to. Patient inquired if Vitamin D  had been refilled as she was out of that medication. Inquired if patient was in need of any other refills and she informs no. Called Pharmacy back and inquired if Vitamin D  had refills. The prescription does have refills so requested they fill the medication per patient's request.  Patient was also informed that BP monitor was at the front desk of PCP office for her to pick up so she could begin monitoring blood pressure. Educated patient to contact pharmacy regarding refills prior to running out of medications  Patient requested a phone call from CPhT on 04/26/23 to remind her of PCP appointment on 04/28/23. Reviewed instructions for monitoring blood sugars and blood pressures at home and reminded patient to keep a written log to review with pharmacist Reminded patient of date/time of upcoming clinical pharmacist follow up and any upcoming PCP/specialists visits. Patient denies transportation barriers to the appointment. Yes  Next clinical pharmacist appointment is scheduled for: 05/29/2024  Kate Caddy, CPhT [facility] Office: [phone] Email: [email or web address]   "

## 2024-04-08 ENCOUNTER — Other Ambulatory Visit (HOSPITAL_COMMUNITY): Payer: Self-pay

## 2024-04-08 ENCOUNTER — Telehealth: Payer: Self-pay

## 2024-04-08 NOTE — Telephone Encounter (Signed)
 Pharmacy Patient Advocate Encounter   Received notification from [facility] that prior authorization for FreeStyle Libre 3 Sensor is required/requested.   Insurance verification completed.   The patient is insured through [employer].   Per test claim: PA required; PA submitted to above mentioned insurance via Latent Key/confirmation #/EOC [id number] Status is pending

## 2024-04-09 NOTE — Telephone Encounter (Signed)
 Pharmacy Patient Advocate Encounter  Received notification from [facility] that Prior Authorization for FreeStyle Libre 3 Sensor  has been APPROVED from 04/07/24 to 04/07/25   PA #/Case ID/Reference #: [id number]

## 2024-04-10 ENCOUNTER — Other Ambulatory Visit (HOSPITAL_COMMUNITY): Payer: Self-pay

## 2024-04-14 ENCOUNTER — Other Ambulatory Visit (HOSPITAL_COMMUNITY): Payer: Self-pay

## 2024-04-25 ENCOUNTER — Telehealth: Payer: Self-pay | Admitting: Pharmacy Technician

## 2024-04-25 NOTE — Progress Notes (Signed)
" ° °  04/25/2024 Name: Michelle Warner MRN: [id number] DOB: Apr 28, 1975  Patient is appearing for a follow-up visit with the population health pharmacy technician. Last engaged with the clinical pharmacist to discuss medication assistance on 04/03/2024. Contacted patient today to discuss appointment reminder per patient request.   Plan from last clinical pharmacist appointment: PA for [facility] 3+ sensors has been denied since patient is no longer using daily insulin  injections. Test claims reflect Accu Chek Guide meter and strips and Softclix lancets are covered at $0 copay, so I have sent orders to the patient's pharmacy for these testing supplies. (copy/paste from last note)   Patient requested at last telephone visit on 04/04/24 that she would prefer a appointment reminder call. Outreached patient this morning and Reminded patient of date/time of upcoming clinical pharmacist follow up and any upcoming PCP/specialists visits. Patient denies transportation barriers to the appointment. Yes  Next clinical pharmacist appointment is scheduled for: PCP 04/28/24, PharmD 05/29/2024  Kate Caddy, CPhT [place] [facility] Office: [phone] Email: [email or web address]   "

## 2024-04-28 ENCOUNTER — Ambulatory Visit: Payer: MEDICAID | Admitting: Nurse Practitioner

## 2024-05-29 ENCOUNTER — Other Ambulatory Visit: Payer: MEDICAID
